# Patient Record
Sex: Female | Born: 1955 | Race: White | Hispanic: No | Marital: Single | Smoking: Former smoker
Health system: Southern US, Community
[De-identification: ages and names within clinical notes are randomized; demographics above are authoritative.]

## PROBLEM LIST (undated history)

## (undated) DIAGNOSIS — N809 Endometriosis, unspecified: Secondary | ICD-10-CM

## (undated) DIAGNOSIS — F32A Depression, unspecified: Secondary | ICD-10-CM

## (undated) DIAGNOSIS — J189 Pneumonia, unspecified organism: Secondary | ICD-10-CM

## (undated) DIAGNOSIS — H269 Unspecified cataract: Secondary | ICD-10-CM

## (undated) DIAGNOSIS — R7303 Prediabetes: Secondary | ICD-10-CM

## (undated) DIAGNOSIS — R0600 Dyspnea, unspecified: Secondary | ICD-10-CM

## (undated) DIAGNOSIS — M1990 Unspecified osteoarthritis, unspecified site: Secondary | ICD-10-CM

## (undated) DIAGNOSIS — D649 Anemia, unspecified: Secondary | ICD-10-CM

## (undated) DIAGNOSIS — U071 COVID-19: Secondary | ICD-10-CM

## (undated) DIAGNOSIS — Z8489 Family history of other specified conditions: Secondary | ICD-10-CM

## (undated) DIAGNOSIS — K219 Gastro-esophageal reflux disease without esophagitis: Secondary | ICD-10-CM

## (undated) DIAGNOSIS — F419 Anxiety disorder, unspecified: Secondary | ICD-10-CM

## (undated) DIAGNOSIS — T7840XA Allergy, unspecified, initial encounter: Secondary | ICD-10-CM

## (undated) DIAGNOSIS — C801 Malignant (primary) neoplasm, unspecified: Secondary | ICD-10-CM

## (undated) DIAGNOSIS — G5603 Carpal tunnel syndrome, bilateral upper limbs: Secondary | ICD-10-CM

## (undated) HISTORY — PX: EYE SURGERY: SHX253

## (undated) HISTORY — DX: Unspecified cataract: H26.9

## (undated) HISTORY — PX: NASAL SEPTUM SURGERY: SHX37

## (undated) HISTORY — DX: Unspecified osteoarthritis, unspecified site: M19.90

## (undated) HISTORY — DX: Anxiety disorder, unspecified: F41.9

## (undated) HISTORY — DX: Malignant (primary) neoplasm, unspecified: C80.1

## (undated) HISTORY — DX: Depression, unspecified: F32.A

## (undated) HISTORY — PX: APPENDECTOMY: SHX54

## (undated) HISTORY — PX: DIAGNOSTIC LAPAROSCOPY: SUR761

## (undated) HISTORY — DX: Allergy, unspecified, initial encounter: T78.40XA

## (undated) HISTORY — DX: Endometriosis, unspecified: N80.9

## (undated) HISTORY — PX: SMALL INTESTINE SURGERY: SHX150

## (undated) HISTORY — DX: Anemia, unspecified: D64.9

---

## 2004-10-20 ENCOUNTER — Ambulatory Visit: Payer: Self-pay | Admitting: Family Medicine

## 2005-07-23 ENCOUNTER — Emergency Department: Payer: Self-pay | Admitting: Emergency Medicine

## 2006-07-12 ENCOUNTER — Encounter: Payer: Self-pay | Admitting: Family Medicine

## 2006-07-12 ENCOUNTER — Ambulatory Visit: Payer: Self-pay | Admitting: Family Medicine

## 2008-08-30 ENCOUNTER — Emergency Department: Payer: Self-pay | Admitting: Emergency Medicine

## 2010-06-09 NOTE — Assessment & Plan Note (Signed)
NAME:  Shari Rivera, ROYSE NO.:  [id number]   MEDICAL RECORD NO.:  [id number]          PATIENT TYPE:  POB   LOCATION:  [facility] at [facility]         FACILITY:  [facility]   PHYSICIAN:  Tinnie Gens, MD        DATE OF BIRTH:  19-Aug-1955   DATE OF SERVICE:  07/12/2006                                  CLINIC NOTE   CHIEF COMPLAINT:  Question menopause.   HISTORY OF PRESENT ILLNESS:  The patient is a 55 year old nulligravida  who previously had normal cycles up until 2 years ago.  Since that time  she had approximately 2 cycles per year.  Her last menstrual period was  September 2007.  For the last 2 years also she has experienced severe  hot flashes, night sweats, mood swings, and some incontinence related to  her night sweats and hot flashes.  The patient reports severe  debilitating hot flashes that come several times daily and several times  nightly.  She is having difficulty keeping her sleep.   PAST MEDICAL HISTORY:  1. 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  2. She had pneumonia in March.  3. She has peptic ulcer disease, which is improved.   PAST SURGICAL HISTORY:  1. Sinus surgery in the early 1980s.  2. Wisdom tooth extraction up in the late 1970s.  3. Appendix removed in 1992.  4. A LEEP procedure in 1994.   Her last Pap was also in 1994.   MEDICATIONS:  She is on Naprosyn as needed, Zantac and Prilosec also as  needed.   ALLERGIES:  None known.   OBSTETRICAL HISTORY:  G0.   GYN HISTORY:  Menarche at age 43.  LMP September 2007.  Cycles  previously regular, every 28 days, lasted 7 days.  She has a history of  endometriosis.  History of abnormal Pap, status post LEEP procedure  without follow-up.   FAMILY HISTORY:  Diabetes, coronary artery disease, hypertension.  Her  mom has a history of ovarian cancer.  Sister with lymphoma.  Grandmother  with leukemia.   SOCIAL HISTORY:  The patient is a previous smoker, 1-1/2 to 2 packs per  day since age of 36.  She  quit with the patch in January of this year.  She drinks approximately 3-5 cups coffee a day as well as a 2 L [facility].  She denies alcohol or other drug use.   A 14-point review of systems is reviewed.  See GYN history in the chart.  The patient has some easy bruising.  She also reports numbness and  weakness in her fingers, left greater than right arm, but both hands  will turn very white.  She denies red or blue turning but that her hands  turn very cold and very pale, consistent with Raynaud's phenomenon.  She  also has night sweats as discussed in the HPI.  Fatigue.  Some weight  gain of about 10 pounds in the last month, although she did quit  smoking.  Some headaches, difficulty with hearing.  Some shortness of  breath probably related to the years of smoking.  Hot flashes, loss of  urine as previously described, some painful intercourse.  PHYSICAL EXAMINATION:  VITAL SIGNS:  Blood pressure is 100/57, weight is  135, pulse is 93.  GENERAL:  She is a well-developed, well-nourished female in no acute  distress.  HEENT:  Normocephalic, atraumatic.  Sclerae anicteric.  NECK:  Supple.  Normal thyroid.  LUNGS:  Clear bilaterally.  CARDIOVASCULAR:  Regular rate and rhythm.  No rubs, gallops or murmurs.  ABDOMEN:  Soft, nontender, nondistended.  EXTREMITIES:  No cyanosis or edema.  She does have some clubbing of her  thumbs, it looks like.  She has 2+ distal pulses.  BREASTS:  Symmetric with everted nipples.  No masses.  LYMPHATIC:  No supraclavicular or axillary adenopathy.  GENITOURINARY:  Very atrophic external genitalia.  BUS is normal.  Vagina is pale with loss of rugation.  Cervix is nulliparous, without  lesion.  Uterus is small and involuted.  No adnexal mass or tenderness.   IMPRESSION:  1. Probable postmenopausal, rule out concomitant thyroid disease.  2. History of abnormal Papanicolaou smear, status post loop      electrosurgical excision procedure.  No  Papanicolaou follow-up      since 1994.  3. Yearly exam.   PLAN:  1. Pap smear today.  2. Scholarship for mammography.  3. TSH and FSH today.  4. Start on Prempro 0.625/2.5 mg one p.o. daily.  5. Will have the patient follow up in approximately 6 weeks to see how      her symptoms are behaving on this dose.           ______________________________  Tinnie Gens, MD     TP/MEDQ  D:  07/12/2006  T:  07/13/2006  Job:  [id number]

## 2013-02-07 ENCOUNTER — Emergency Department: Payer: Self-pay | Admitting: Emergency Medicine

## 2015-05-27 ENCOUNTER — Emergency Department
Admission: EM | Admit: 2015-05-27 | Discharge: 2015-05-28 | Disposition: A | Discharge status: Home / Self Care | Payer: Self-pay | Attending: Emergency Medicine | Admitting: Emergency Medicine

## 2015-05-27 ENCOUNTER — Encounter: Payer: Self-pay | Admitting: Medical Oncology

## 2015-05-27 DIAGNOSIS — F4320 Adjustment disorder, unspecified: Secondary | ICD-10-CM

## 2015-05-27 DIAGNOSIS — R45851 Suicidal ideations: Secondary | ICD-10-CM | POA: Insufficient documentation

## 2015-05-27 DIAGNOSIS — Z87891 Personal history of nicotine dependence: Secondary | ICD-10-CM | POA: Insufficient documentation

## 2015-05-27 LAB — URINE DRUG SCREEN, QUALITATIVE (ARMC ONLY)
Amphetamines, Ur Screen: NOT DETECTED
BARBITURATES, UR SCREEN: NOT DETECTED
Benzodiazepine, Ur Scrn: NOT DETECTED
COCAINE METABOLITE, UR ~~LOC~~: NOT DETECTED
Cannabinoid 50 Ng, Ur ~~LOC~~: POSITIVE — AB
MDMA (Ecstasy)Ur Screen: NOT DETECTED
METHADONE SCREEN, URINE: NOT DETECTED
Opiate, Ur Screen: NOT DETECTED
Phencyclidine (PCP) Ur S: NOT DETECTED
TRICYCLIC, UR SCREEN: NOT DETECTED

## 2015-05-27 LAB — COMPREHENSIVE METABOLIC PANEL
ALT: 40 U/L (ref 14–54)
ANION GAP: 8 (ref 5–15)
AST: 34 U/L (ref 15–41)
Albumin: 4.4 g/dL (ref 3.5–5.0)
Alkaline Phosphatase: 78 U/L (ref 38–126)
BUN: 14 mg/dL (ref 6–20)
CALCIUM: 9.9 mg/dL (ref 8.9–10.3)
CHLORIDE: 104 mmol/L (ref 101–111)
CO2: 26 mmol/L (ref 22–32)
Creatinine, Ser: 0.81 mg/dL (ref 0.44–1.00)
GFR calc non Af Amer: >60 mL/min (ref >60)
Glucose, Bld: 133 mg/dL — ABNORMAL HIGH (ref 65–99)
Potassium: 3.9 mmol/L (ref 3.5–5.1)
SODIUM: 138 mmol/L (ref 135–145)
Total Bilirubin: 0.5 mg/dL (ref 0.3–1.2)
Total Protein: 7.4 g/dL (ref 6.5–8.1)

## 2015-05-27 LAB — ETHANOL

## 2015-05-27 LAB — CBC
HCT: 36.1 % (ref 35.0–47.0)
HEMOGLOBIN: 12.4 g/dL (ref 12.0–16.0)
MCH: 33.6 pg (ref 26.0–34.0)
MCHC: 34.3 g/dL (ref 32.0–36.0)
MCV: 98 fL (ref 80.0–100.0)
PLATELETS: 245 10*3/uL (ref 150–440)
RBC: 3.69 MIL/uL — AB (ref 3.80–5.20)
RDW: 12.7 % (ref 11.5–14.5)
WBC: 9.7 10*3/uL (ref 3.6–11.0)

## 2015-05-27 LAB — ACETAMINOPHEN LEVEL

## 2015-05-27 LAB — SALICYLATE LEVEL

## 2015-05-27 NOTE — ED Notes (Signed)
Pt in under police custody with reports that [employer] was making a call near pts home when they seen here standing in her door holding a gun to herself. [employer] called PD and pt was brought in with hand cuffs. Pt agitated bc she reports that her mom died recently and she has been staying in her house and the bank is foreclosing it Friday leaving the patient homeless. Pt reports she has a lot of stress but denies having gun, denies SI/HI.

## 2015-05-27 NOTE — ED Provider Notes (Signed)
[facility] Emergency Department Provider Note   ____________________________________________  Time seen: Approximately [id number] PM  I have reviewed the triage vital signs and the nursing notes.   HISTORY  Chief Complaint Psychiatric Evaluation   HPI Shari Rivera is a 60 y.o. female without chronic medical problems was presenting to the emergency department today after saying that she was going to kill her self with a gun. Per the initial nursing note the patient was holding a gun to her head. However, the patient denies pulling an actual gun to her head but says that she was pointing to her admission to the motion saying that she was going to kill herself. She says that she is having her house foreclosed on and also the utilities turned off and she has been very frustrated. She says this is why she made the threat. However, she says that she is not suicidal or homicidal. She says that she does not have any psychiatric history or history of suicide attempts.   History reviewed. No pertinent past medical history.  There are no active problems to display for this patient.   History reviewed. No pertinent past surgical history.  Current Outpatient Rx  Name  Route  Sig  Dispense  Refill  . Calcium Carbonate-Vitamin D (CALCIUM 600+D) 600-400 MG-UNIT tablet   Oral   Take 1 tablet by mouth daily.         . Diphenhydramine-APAP, sleep, (EXCEDRIN PM) 38-500 MG TABS   Oral   Take 2 tablets by mouth at bedtime as needed (for sleep).         . Multiple Vitamin (MULTIVITAMIN WITH MINERALS) TABS tablet   Oral   Take 1 tablet by mouth daily.           Allergies Codeine  No family history on file.  Social History Social History  Substance Use Topics  . Smoking status: Former Research scientist (life sciences)  . Smokeless tobacco: None  . Alcohol Use: No    Review of Systems Constitutional: No fever/chills Eyes: No visual changes. ENT: No sore throat. Cardiovascular:  Denies chest pain. Respiratory: Denies shortness of breath. Gastrointestinal: No abdominal pain.  No nausea, no vomiting.  No diarrhea.  No constipation. Genitourinary: Negative for dysuria. Musculoskeletal: Negative for back pain. Skin: Negative for rash. Neurological: Negative for headaches, focal weakness or numbness.  10-point ROS otherwise negative.  ____________________________________________   PHYSICAL EXAM:  VITAL SIGNS: ED Triage Vitals  Enc Vitals Group     BP 05/27/15 1858 117/65 mmHg     Pulse Rate 05/27/15 1858 100     Resp 05/27/15 1858 18     Temp 05/27/15 1858 97.7 F (36.5 C)     Temp Source 05/27/15 1858 Oral     SpO2 05/27/15 1858 100 %     Weight 05/27/15 1858 127 lb (57.607 kg)     Height 05/27/15 1858 5\' 3"  (1.6 m)     Head Cir --      Peak Flow --      Pain Score --      Pain Loc --      Pain Edu? --      Excl. in [place]? --     Constitutional: Alert and oriented. Well appearing and in no acute distress. Eyes: Conjunctivae are normal. PERRL. EOMI. Head: Atraumatic. Nose: No congestion/rhinnorhea. Mouth/Throat: Mucous membranes are moist.   Neck: No stridor.   Cardiovascular: Normal rate, regular rhythm. Grossly normal heart sounds.   Respiratory: Normal respiratory  effort.  No retractions. Lungs CTAB. Gastrointestinal: Soft and nontender. No distention.  Musculoskeletal: No lower extremity tenderness nor edema.  No joint effusions. Neurologic:  Normal speech and language. No gross focal neurologic deficits are appreciated.  Skin:  Skin is warm, dry and intact. No rash noted. Psychiatric: Mood and affect are normal. Speech and behavior are normal.  ____________________________________________   LABS (all labs ordered are listed, but only abnormal results are displayed)  Labs Reviewed  COMPREHENSIVE METABOLIC PANEL - Abnormal; Notable for the following:    Glucose, Bld 133 (*)    All other components within normal limits  ACETAMINOPHEN LEVEL  - Abnormal; Notable for the following:    Acetaminophen (Tylenol), Serum <10 (*)    All other components within normal limits  CBC - Abnormal; Notable for the following:    RBC 3.69 (*)    All other components within normal limits  URINE DRUG SCREEN, QUALITATIVE ([facility] ONLY) - Abnormal; Notable for the following:    Cannabinoid 50 Ng, Ur  POSITIVE (*)    All other components within normal limits  ETHANOL  SALICYLATE LEVEL   ____________________________________________  EKG   ____________________________________________  RADIOLOGY   ____________________________________________   PROCEDURES    ____________________________________________   INITIAL IMPRESSION / ASSESSMENT AND PLAN / ED COURSE  Pertinent labs & imaging results that were available during my care of the patient were reviewed by me and considered in my medical decision making (see chart for details).  Patient, and with normal affect and says that she was just frustrated. She denies having a gun. She was involuntarily committed prior to arrival. She will be seen by psychiatry. Says that she has friends to stay with once she loses the ability to stay in a house this Friday. ____________________________________________   FINAL CLINICAL IMPRESSION(S) / ED DIAGNOSES  Suicidal ideation.    NEW MEDICATIONS STARTED DURING THIS VISIT:  New Prescriptions   No medications on file     Note:  This document was prepared using Dragon voice recognition software and may include unintentional dictation errors.    Orbie Pyo, MD 05/27/15 [phone]

## 2015-05-27 NOTE — ED Notes (Signed)
Pt in room watching tv and calm

## 2015-05-27 NOTE — BH Assessment (Addendum)
Tele Assessment Note   Shari Rivera is an 60 y.o. female presenting to [facility] after being petitioned for involuntary commitment. Pt stated "I am here for something stupid". "It was blown out of portion". Pt reported that she was feeling frustrated with the events of today. Pt reported that her mother passed away and she is currently staying in her mother'Rivera home. She reported that prior to her mother'Rivera death, her mother took out a reverse mortgage and now the bank is going to foreclose on the home. She reported that on Friday she will have to move out and shared that she spoke with various utility companies to keep her utilities on until Friday. She shared that today her water was off and someone from [place] was there to disconnect the power. She stated "I was so frustrated with everything going on I said that I wish this was over". "If I had a gun I would shoot myself". Pt denies SI, HI and AVH at this time. Pt did not report any previous suicide attempts or self-injurious behaviors. Pt denied having a psychiatric history and did not report any acute hospitalizations. Pt reported that having to move out on Friday is a stressor and shared that she has always had trouble sleeping. Pt shared that she feels fatigues and has insomnia.  Pt did not report any alcohol or illicit substance abuse at this time. Pt denied having access to weapons; however documentation from medical staff reported that pt was standing in her door holding a gun to herself. Pt did not report any pending criminal charges or upcoming court dates. Pt denies physical, sexual and emotional abuse at this time. PT reported that she is employed and has a good support system which includes her siblings. Pt is alert and oriented x3. Pt is dressed appropriately in scrubs and maintains good eye contact during this assessment. Pt speech is normal and her mood and affect is appropriate. PT thought process is coherent and relevant. PT'Rivera judgement is fair.    Diagnosis: F99 Unspecified mental disorder    Past Medical History: History reviewed. No pertinent past medical history.  History reviewed. No pertinent past surgical history.  Family History: No family history on file.  Social History:  reports that she has quit smoking. She does not have any smokeless tobacco history on file. She reports that she does not drink alcohol or use illicit drugs.  Additional Social History:  Alcohol / Drug Use History of alcohol / drug use?: No history of alcohol / drug abuse  CIWA: CIWA-Ar BP: 135/72 mmHg Pulse Rate: 94 COWS:    PATIENT STRENGTHS: (choose at least two) Average or above average intelligence Supportive family/friends  Allergies:  Allergies  Allergen Reactions  . Codeine Diarrhea and Nausea And Vomiting    Home Medications:  (Not in a hospital admission)  OB/GYN Status:  No LMP recorded. Patient is postmenopausal.  General Assessment Data Location of Assessment: [facility] ED TTS Assessment: In system Is this a Tele or Face-to-Face Assessment?: Tele Assessment Is this an Initial Assessment or a Re-assessment for this encounter?: Initial Assessment Is patient pregnant?: No Pregnancy Status: No Living Arrangements: Alone Can pt return to current living arrangement?: Yes Admission Status: Involuntary Is patient capable of signing voluntary admission?: Yes Referral Source: Self/Family/Friend Insurance type: None      Crisis Care Plan Living Arrangements: Alone Name of Psychiatrist: No provider reported.  Name of Therapist: No provider reported.   Education Status Is patient currently in school?: No  Risk to self with the past 6 months Suicidal Ideation: No Has patient been a risk to self within the past 6 months prior to admission? : No Suicidal Intent: No Has patient had any suicidal intent within the past 6 months prior to admission? : No Is patient at risk for suicide?: No Suicidal Plan?: No Has patient had any  suicidal plan within the past 6 months prior to admission? : No Access to Means: No What has been your use of drugs/alcohol within the last 12 months?: Pt denies alcohol and drug use.  Previous Attempts/Gestures: No How many times?: 0 Other Self Harm Risks: Pt denies.  Triggers for Past Attempts: None known (No previous attempts reported. ) Intentional Self Injurious Behavior: None Family Suicide History: No Recent stressful life event(Rivera): Other (Comment) (Pt reported that her mother'Rivera home is going into foreclosure) Persecutory voices/beliefs?: Yes Depression: No Depression Symptoms: Insomnia, Fatigue Substance abuse history and/or treatment for substance abuse?: No  Risk to Others within the past 6 months Homicidal Ideation: No Does patient have any lifetime risk of violence toward others beyond the six months prior to admission? : No Thoughts of Harm to Others: No Current Homicidal Intent: No Current Homicidal Plan: No Access to Homicidal Means: No Identified Victim: N/A History of harm to others?: No Assessment of Violence: None Noted Violent Behavior Description: No violent behaviors observed. Pt is calm and cooperative at this time.  Does patient have access to weapons?: No Criminal Charges Pending?: No Does patient have a court date: No Is patient on probation?: No  Psychosis Hallucinations: None noted Delusions: None noted  Mental Status Report Appearance/Hygiene: In scrubs Eye Contact: Good Motor Activity: Freedom of movement Speech: Logical/coherent Level of Consciousness: Quiet/awake Mood: Pleasant Affect: Appropriate to circumstance Anxiety Level: Moderate Thought Processes: Coherent, Relevant Judgement: Unimpaired Orientation: Person, Situation, Time, Place, Appropriate for developmental age Obsessive Compulsive Thoughts/Behaviors: None  Cognitive Functioning Concentration: Normal Memory: Recent Intact, Remote Intact IQ: Average Insight: Fair Impulse  Control: Good Appetite: Good Weight Loss: 0 Weight Gain: 0 Sleep: No Change Total Hours of Sleep: 5 (wakes frequently ) Vegetative Symptoms: None  ADLScreening [facility] Assessment Services) Patient'Rivera cognitive ability adequate to safely complete daily activities?: Yes Patient able to express need for assistance with ADLs?: Yes Independently performs ADLs?: Yes (appropriate for developmental age)  Prior Inpatient Therapy Prior Inpatient Therapy: No  Prior Outpatient Therapy Prior Outpatient Therapy: No Does patient have an ACCT team?: No Does patient have Intensive In-House Services?  : No Does patient have [facility] services? : No Does patient have P4CC services?: No  ADL Screening (condition at time of admission) Patient'Rivera cognitive ability adequate to safely complete daily activities?: Yes Is the patient deaf or have difficulty hearing?: No Does the patient have difficulty seeing, even when wearing glasses/contacts?: No Does the patient have difficulty concentrating, remembering, or making decisions?: No Patient able to express need for assistance with ADLs?: Yes Does the patient have difficulty dressing or bathing?: No Independently performs ADLs?: Yes (appropriate for developmental age)       Abuse/Neglect Assessment (Assessment to be complete while patient is alone) Physical Abuse: Denies Verbal Abuse: Denies Sexual Abuse: Denies Exploitation of patient/patient'Rivera resources: Denies Self-Neglect: Denies     Regulatory affairs officer (For Healthcare) Does patient have an advance directive?: No Would patient like information on creating an advanced directive?: No - patient declined information    Additional Information 1:1 In Past 12 Months?: Yes CIRT Risk: No Elopement Risk: No Does patient have  medical clearance?: Yes     Disposition:  Disposition Initial Assessment Completed for this Encounter: Yes  Shari Rivera 05/27/2015 11:15 PM

## 2015-05-28 NOTE — ED Provider Notes (Signed)
-----------------------------------------   1:22 AM on 05/28/2015 -----------------------------------------   Blood pressure 135/72, pulse 94, temperature 97.7 F (36.5 C), temperature source Oral, resp. rate 20, height 5\' 3"  (1.6 m), weight 127 lb (57.607 kg), SpO2 98 %.  Assuming care from Dr. Clearnce Hasten.  In short, Shari Rivera is a 60 y.o. female with a chief complaint of Psychiatric Evaluation .  Refer to the original H&P for additional details.  The current plan of care is to follow up the recommendations of the specialist on call.   Patient was seen by the specialist on-call and they recommend discontinuing the IVC. They report that the patient does not meet criteria for involuntary psychiatric hospitalization. The patient is not an acute suicidal risk at this time according to the specialist on-call. According to the associated the patient's neighbor feels it is safe for the patient to go home and she will be discharged with her friend. They recommend melatonin for her insomnia and recommend outpatient psychiatric follow-up within 4 weeks.  She will be discharged home.  Loney Hering, MD 05/28/15 [phone]

## 2015-05-28 NOTE — Discharge Instructions (Signed)
No-harm Safety Contract A no-harm safety contract is a written or verbal agreement between you and a mental health professional to promote safety. It contains specific actions and promises you agree to. The agreement also includes instructions from the therapist or doctor. The instructions will help prevent you from harming yourself or harming others. Harm can be as mild as pinching yourself, but can increase in intensity to actions like burning or cutting yourself. The extreme level of self-harm would be committing suicide. No-harm safety contracts are also sometimes referred to as a Charity fundraiser, suicide Financial controller, no-harm agreements or decisions, or a Engineer, manufacturing systems.  REASONS FOR NO-HARM SAFETY CONTRACTS Safety contracts are just one part of an overall treatment plan to help keep you safe and free of harm. A safety contract may help to relieve anxiety, restore a sense of control, state clearly the alternatives to harm or suicide, and give you and your therapist or doctor a gauge for how you are doing in between visits. Many factors impact the decision to use a no-harm safety contract and its effectiveness. A proper overall treatment plan and evaluation and good patient understanding are the keys to good outcomes. CONTRACT ELEMENTS  A contract can range from simple to complex. They include all or some of the following:  Action statements. These are statements you agree to do or not do. Example: If I feel my life is becoming too difficult, I agree to do the following so there is no harm to myself or others:  Talk with family or friends.  Rid myself of all things that I could use to harm myself.  Do an activity I enjoy or have enjoyed in the recent past. Coping strategies. These are ways to think and feel that decrease stress, such as:  Use of affirmations or positive statements about self.  Good self-care, including improved grooming, and healthy eating, and healthy sleeping  patterns.  Increase physical exercise.  Increase social involvement.  Focus on positive aspects of life. Crisis management. This would include what to do if there was trouble following the contract or an urge to harm. This might include notifying family or your therapist of suicidal thoughts. Be open and honest about suicidal urges. To prevent a crisis, do the following:  List reasons to reach out for support.  Keep contact numbers and available hours handy. Treatment goals. These are goals would include no suicidal thoughts, improved mood, and feelings of hopefulness. Listed responsibilities of different people involved in care. This could include family members. A family member may agree to remove firearms or other lethal weapons/substances from your ease of access. A timeline. A timeline can be in place from one therapy session to the next session. HOME CARE INSTRUCTIONS   Follow your no-harm safety contract.  Contact your therapist and/or doctor if you have any questions or concerns. MAKE SURE YOU:   Understand these instructions.  Will watch your condition. Noticing any mood changes or suicidal urges.  Will get help right away if you are not doing well or get worse.   This information is not intended to replace advice given to you by your health care provider. Make sure you discuss any questions you have with your health care provider.   Document Released: 07/01/2009 Document Revised: 02/01/2014 Document Reviewed: 07/01/2009 Elsevier Interactive Patient Education 2016 [employer].  Adjustment Disorder Adjustment disorder is an unusually severe reaction to a stressful life event, such as the loss of a job or physical illness. The event  may be any stressful event other than the loss of a loved one. Adjustment disorder may affect your feelings, your thinking, how you act, or a combination of these. It may interfere with personal relationships or with the way you are at work,  school, or home. People with this disorder are at risk for suicide and substance abuse. They may develop a more serious mental disorder, such as major depressive disorder or post-traumatic stress disorder. SIGNS AND SYMPTOMS  Symptoms may include:  Sadness, depressed mood, or crying spells.  Loss of enjoyment.  Change in appetite or weight.  Sense of loss or hopelessness.  Thoughts of suicide.  Anxiety, worry, or nervousness.  Trouble sleeping.  Avoiding family and friends.  Poor school performance.  Fighting or vandalism.  Reckless driving.  Skipping school.  Poor work Systems analyst.  Ignoring bills. Symptoms of adjustment disorder start within 3 months of the stressful life event. They do not last more than 6 months after the event has ended. DIAGNOSIS  To make a diagnosis, your health care provider will ask about what has happened in your life and how it has affected you. He or she may also ask about your medical history and use of medicines, alcohol, and other substances. Your health care provider may do a physical exam and order lab tests or other studies. You may be referred to a mental health specialist for evaluation. TREATMENT  Treatment options include:  Counseling or talk therapy. Talk therapy is usually provided by mental health specialists.  Medicine. Certain medicines may help with depression, anxiety, and sleep.  Support groups. Support groups offer emotional support, advice, and guidance. They are made up of people who have had similar experiences. HOME CARE INSTRUCTIONS  Keep all follow-up visits as directed by your health care provider. This is important.  Take medicines only as directed by your health care provider. SEEK MEDICAL CARE IF:  Your symptoms get worse.  SEEK IMMEDIATE MEDICAL CARE IF: You have serious thoughts about hurting yourself or someone else. MAKE SURE YOU:  Understand these instructions.  Will watch your condition.  Will get  help right away if you are not doing well or get worse.   This information is not intended to replace advice given to you by your health care provider. Make sure you discuss any questions you have with your health care provider.   Document Released: 09/15/2005 Document Revised: 02/01/2014 Document Reviewed: 06/05/2013 Elsevier Interactive Patient Education 2016 [place] and Stress Management Stress is a normal reaction to life events. It is what you feel when life demands more than you are used to or more than you can handle. Some stress can be useful. For example, the stress reaction can help you catch the last bus of the day, study for a test, or meet a deadline at work. But stress that occurs too often or for too long can cause problems. It can affect your emotional health and interfere with relationships and normal daily activities. Too much stress can weaken your immune system and increase your risk for physical illness. If you already have a medical problem, stress can make it worse. CAUSES  All sorts of life events may cause stress. An event that causes stress for one person may not be stressful for another person. Major life events commonly cause stress. These may be positive or negative. Examples include losing your job, moving into a new home, getting married, having a baby, or losing a loved one. Less obvious life  events may also cause stress, especially if they occur day after day or in combination. Examples include working long hours, driving in traffic, caring for children, being in debt, or being in a difficult relationship. SIGNS AND SYMPTOMS Stress may cause emotional symptoms including, the following:  Anxiety. This is feeling worried, afraid, on edge, overwhelmed, or out of control.  Anger. This is feeling irritated or impatient.  Depression. This is feeling sad, down, helpless, or guilty.  Difficulty focusing, remembering, or making decisions. Stress may cause  physical symptoms, including the following:   Aches and pains. These may affect your head, neck, back, stomach, or other areas of your body.  Tight muscles or clenched jaw.  Low energy or trouble sleeping. Stress may cause unhealthy behaviors, including the following:   Eating to feel better (overeating) or skipping meals.  Sleeping too little, too much, or both.  Working too much or putting off tasks (procrastination).  Smoking, drinking alcohol, or using drugs to feel better. DIAGNOSIS  Stress is diagnosed through an assessment by your health care provider. Your health care provider will ask questions about your symptoms and any stressful life events.Your health care provider will also ask about your medical history and may order blood tests or other tests. Certain medical conditions and medicine can cause physical symptoms similar to stress. Mental illness can cause emotional symptoms and unhealthy behaviors similar to stress. Your health care provider may refer you to a mental health professional for further evaluation.  TREATMENT  Stress management is the recommended treatment for stress.The goals of stress management are reducing stressful life events and coping with stress in healthy ways.  Techniques for reducing stressful life events include the following:  Stress identification. Self-monitor for stress and identify what causes stress for you. These skills may help you to avoid some stressful events.  Time management. Set your priorities, keep a calendar of events, and learn to say "no." These tools can help you avoid making too many commitments. Techniques for coping with stress include the following:  Rethinking the problem. Try to think realistically about stressful events rather than ignoring them or overreacting. Try to find the positives in a stressful situation rather than focusing on the negatives.  Exercise. Physical exercise can release both physical and emotional  tension. The key is to find a form of exercise you enjoy and do it regularly.  Relaxation techniques. These relax the body and mind. Examples include yoga, meditation, tai chi, biofeedback, deep breathing, progressive muscle relaxation, listening to music, being out in nature, journaling, and other hobbies. Again, the key is to find one or more that you enjoy and can do regularly.  Healthy lifestyle. Eat a balanced diet, get plenty of sleep, and do not smoke. Avoid using alcohol or drugs to relax.  Strong support network. Spend time with family, friends, or other people you enjoy being around.Express your feelings and talk things over with someone you trust. Counseling or talktherapy with a mental health professional may be helpful if you are having difficulty managing stress on your own. Medicine is typically not recommended for the treatment of stress.Talk to your health care provider if you think you need medicine for symptoms of stress. HOME CARE INSTRUCTIONS  Keep all follow-up visits as directed by your health care provider.  Take all medicines as directed by your health care provider. SEEK MEDICAL CARE IF:  Your symptoms get worse or you start having new symptoms.  You feel overwhelmed by your problems and can  no longer manage them on your own. SEEK IMMEDIATE MEDICAL CARE IF:  You feel like hurting yourself or someone else.   This information is not intended to replace advice given to you by your health care provider. Make sure you discuss any questions you have with your health care provider.   Document Released: 07/07/2000 Document Revised: 02/01/2014 Document Reviewed: 09/05/2012 Elsevier Interactive Patient Education [employer].

## 2017-10-05 ENCOUNTER — Emergency Department
Admission: EM | Admit: 2017-10-05 | Discharge: 2017-10-05 | Disposition: A | Discharge status: Home / Self Care | Payer: Self-pay | Attending: Emergency Medicine | Admitting: Emergency Medicine

## 2017-10-05 ENCOUNTER — Other Ambulatory Visit: Payer: Self-pay

## 2017-10-05 DIAGNOSIS — K529 Noninfective gastroenteritis and colitis, unspecified: Secondary | ICD-10-CM | POA: Insufficient documentation

## 2017-10-05 DIAGNOSIS — Z87891 Personal history of nicotine dependence: Secondary | ICD-10-CM | POA: Insufficient documentation

## 2017-10-05 DIAGNOSIS — N3000 Acute cystitis without hematuria: Secondary | ICD-10-CM | POA: Insufficient documentation

## 2017-10-05 LAB — COMPREHENSIVE METABOLIC PANEL
ALT: 37 U/L (ref 0–44)
ANION GAP: 8 (ref 5–15)
AST: 27 U/L (ref 15–41)
Albumin: 4.6 g/dL (ref 3.5–5.0)
Alkaline Phosphatase: 113 U/L (ref 38–126)
BUN: 9 mg/dL (ref 8–23)
CHLORIDE: 106 mmol/L (ref 98–111)
CO2: 25 mmol/L (ref 22–32)
Calcium: 10.3 mg/dL (ref 8.9–10.3)
Creatinine, Ser: 0.87 mg/dL (ref 0.44–1.00)
Glucose, Bld: 119 mg/dL — ABNORMAL HIGH (ref 70–99)
POTASSIUM: 4.1 mmol/L (ref 3.5–5.1)
Sodium: 139 mmol/L (ref 135–145)
Total Bilirubin: 0.5 mg/dL (ref 0.3–1.2)
Total Protein: 8.3 g/dL — ABNORMAL HIGH (ref 6.5–8.1)

## 2017-10-05 LAB — URINALYSIS, COMPLETE (UACMP) WITH MICROSCOPIC
BILIRUBIN URINE: NEGATIVE
Glucose, UA: NEGATIVE mg/dL
Hgb urine dipstick: NEGATIVE
KETONES UR: NEGATIVE mg/dL
Nitrite: NEGATIVE
PH: 5 (ref 5.0–8.0)
Protein, ur: NEGATIVE mg/dL
SPECIFIC GRAVITY, URINE: 1.019 (ref 1.005–1.030)

## 2017-10-05 LAB — LIPASE, BLOOD: LIPASE: 24 U/L (ref 11–51)

## 2017-10-05 LAB — CBC
HEMATOCRIT: 44.9 % (ref 35.0–47.0)
Hemoglobin: 15.1 g/dL (ref 12.0–16.0)
MCH: 34 pg (ref 26.0–34.0)
MCHC: 33.7 g/dL (ref 32.0–36.0)
MCV: 101 fL — AB (ref 80.0–100.0)
Platelets: 259 10*3/uL (ref 150–440)
RBC: 4.45 MIL/uL (ref 3.80–5.20)
RDW: 14 % (ref 11.5–14.5)
WBC: 6.7 10*3/uL (ref 3.6–11.0)

## 2017-10-05 MED ORDER — NITROFURANTOIN MONOHYD MACRO 100 MG PO CAPS: 100.0000 mg | ORAL_CAPSULE | Freq: Two times a day (BID) | ORAL | 0 refills | Status: AC

## 2017-10-05 MED ORDER — ONDANSETRON HCL 4 MG/2ML IJ SOLN
4.0000 mg | Freq: Once | INTRAMUSCULAR | Status: AC
  Administered 2017-10-05: 4 mg via INTRAVENOUS
  Filled 2017-10-05: qty 2

## 2017-10-05 MED ORDER — SODIUM CHLORIDE 0.9 % IV SOLN
Freq: Once | INTRAVENOUS | Status: AC
  Administered 2017-10-05: 10:00:00 via INTRAVENOUS

## 2017-10-05 MED ORDER — ONDANSETRON 4 MG PO TBDP: 4.0000 mg | ORAL_TABLET | Freq: Three times a day (TID) | ORAL | 0 refills | Status: DC | PRN

## 2017-10-05 NOTE — ED Provider Notes (Signed)
[facility] Emergency Department Provider Note       Time seen: ----------------------------------------- 9:29 AM on 10/05/2017 -----------------------------------------   I have reviewed the triage vital signs and the nursing notes.  HISTORY   Chief Complaint Diarrhea and Emesis    HPI Shari Rivera is a 62 y.o. female with no significant past medical history who presents to the ED for diarrhea and vomiting ever since Sunday.  Patient states she has had at least 6 in the last 24 hours of each.  She has had some blurry vision and is not sure if that is related.  She has a headache and has had difficulty keeping anything down.  She denies any specific abdominal pain.  History reviewed. No pertinent past medical history.  There are no active problems to display for this patient.   History reviewed. No pertinent surgical history.  Allergies Codeine  Social History Social History   Tobacco Use  . Smoking status: Former Research scientist (life sciences)  . Smokeless tobacco: Never Used  Substance Use Topics  . Alcohol use: No  . Drug use: No   Review of Systems Constitutional: Negative for fever. Eyes: Positive for vision changes Cardiovascular: Negative for chest pain. Respiratory: Negative for shortness of breath. Gastrointestinal: Positive for vomiting and diarrhea Musculoskeletal: Negative for back pain. Skin: Negative for rash. Neurological: Negative for headaches, focal weakness or numbness.  All systems negative/normal/unremarkable except as stated in the HPI  ____________________________________________   PHYSICAL EXAM:  VITAL SIGNS: ED Triage Vitals  Enc Vitals Group     BP 10/05/17 0850 127/86     Pulse Rate 10/05/17 0850 (!) 108     Resp --      Temp 10/05/17 0850 97.6 F (36.4 C)     Temp Source 10/05/17 0850 Oral     SpO2 10/05/17 0850 100 %     Weight 10/05/17 0850 116 lb (52.6 kg)     Height 10/05/17 0850 5\' 3"  (1.6 m)     Head  Circumference --      Peak Flow --      Pain Score 10/05/17 0853 7     Pain Loc --      Pain Edu? --      Excl. in [place]? --    Constitutional: Alert and oriented. Well appearing and in no distress. Eyes: Conjunctivae are normal. Normal extraocular movements. ENT   Head: Normocephalic and atraumatic.   Nose: No congestion/rhinnorhea.   Mouth/Throat: Mucous membranes are moist.   Neck: No stridor. Cardiovascular: Normal rate, regular rhythm. No murmurs, rubs, or gallops. Respiratory: Normal respiratory effort without tachypnea nor retractions. Breath sounds are clear and equal bilaterally. No wheezes/rales/rhonchi. Gastrointestinal: Soft and nontender. Normal bowel sounds Musculoskeletal: Nontender with normal range of motion in extremities. No lower extremity tenderness nor edema. Neurologic:  Normal speech and language. No gross focal neurologic deficits are appreciated.  Skin:  Skin is warm, dry and intact. No rash noted. Psychiatric: Mood and affect are normal. Speech and behavior are normal.  ____________________________________________  ED COURSE:  As part of my medical decision making, I reviewed the following data within the [place] History obtained from family if available, nursing notes, old chart and ekg, as well as notes from prior ED visits. Patient presented for vomiting and diarrhea, we will assess with labs and give IV fluids and antiemetics   Procedures ____________________________________________   LABS (pertinent positives/negatives)  Labs Reviewed  COMPREHENSIVE METABOLIC PANEL - Abnormal; Notable for the following  components:      Result Value   Glucose, Bld 119 (*)    Total Protein 8.3 (*)    All other components within normal limits  CBC - Abnormal; Notable for the following components:   MCV 101.0 (*)    All other components within normal limits  URINALYSIS, COMPLETE (UACMP) WITH MICROSCOPIC - Abnormal; Notable for the  following components:   Color, Urine YELLOW (*)    APPearance HAZY (*)    Leukocytes, UA LARGE (*)    Bacteria, UA RARE (*)    All other components within normal limits  LIPASE, BLOOD   ___________________________________________  DIFFERENTIAL DIAGNOSIS   Gastroenteritis, dehydration, electrolyte abnormality, hyperglycemia  FINAL ASSESSMENT AND PLAN  Gastroenteritis   Plan: The patient had presented for vomiting and diarrhea. Patient's labs revealed perhaps mild UTI although no other acute process.  He was feeling much better after fluids and antiemetics.  She will be discharged home with antiemetics and encouraged to take Imodium as needed.   Laurence Aly, MD   Note: This note was generated in part or whole with voice recognition software. Voice recognition is usually quite accurate but there are transcription errors that can and very often do occur. I apologize for any typographical errors that were not detected and corrected.     Earleen Newport, MD 10/05/17 1121

## 2017-10-05 NOTE — ED Triage Notes (Signed)
Pt states that she has been sick since Sunday with diarrhea (x6 in 24 hours), vomiting (x6 in 24 hours), and blurred vision (pt has cataracts and has had blurred vision for quite some time worse at night)

## 2020-04-07 ENCOUNTER — Telehealth: Payer: Self-pay | Admitting: General Practice

## 2020-04-07 NOTE — Telephone Encounter (Signed)
Pt is calling to see if Dr. Rosanna Randy would consider taking her on as a new patient. Pts Sister Thamas Jaegers is a patient of Dr. Rosanna Randy. Please advise [phone]- [phone]

## 2020-04-08 NOTE — Telephone Encounter (Signed)
Ok to make appt for this new pt this spring or summer.

## 2020-04-10 NOTE — Telephone Encounter (Signed)
Please schedule new pt appt for patient. Thanks!

## 2020-05-26 ENCOUNTER — Ambulatory Visit: Payer: Self-pay | Admitting: Family Medicine

## 2020-06-25 DIAGNOSIS — Z20822 Contact with and (suspected) exposure to covid-19: Secondary | ICD-10-CM | POA: Diagnosis not present

## 2020-06-25 DIAGNOSIS — Z03818 Encounter for observation for suspected exposure to other biological agents ruled out: Secondary | ICD-10-CM | POA: Diagnosis not present

## 2020-08-07 ENCOUNTER — Ambulatory Visit: Payer: Medicare Other | Admitting: Family Medicine

## 2020-10-06 ENCOUNTER — Telehealth: Payer: Self-pay

## 2020-10-06 NOTE — Telephone Encounter (Signed)
Copied from [place] [phone]. Topic: Appointment Scheduling - Prior Auth Required for Appointment >> Oct 06, 2020 10:36 AM Robina Ade, Helene Kelp D wrote: No appointment has been scheduled. Patient is requesting New patient appointment. Patient had appointment with Dr. Rosanna Randy had to cancelled due to having covid and would like to reschedule appointment with him soon. Per scheduling protocol, this appointment requires a prior authorization prior to scheduling.  Route to department's PEC pool.

## 2020-10-06 NOTE — Telephone Encounter (Signed)
Please advise if you approved this patient to establish care as your new patient.

## 2020-10-06 NOTE — Telephone Encounter (Signed)
Left patient a voicemail that Dr. Rosanna Randy is not taking new patients.

## 2020-10-06 NOTE — Telephone Encounter (Signed)
Copied from [place] [phone]. Topic: Appointment Scheduling - Prior Auth Required for Appointment >> Oct 06, 2020 10:36 AM Robina Ade, Helene Kelp D wrote: No appointment has been scheduled. Patient is requesting New patient appointment. Patient had appointment with Dr. Rosanna Randy had to cancelled due to having covid and would like to reschedule appointment with him soon. Per scheduling protocol, this appointment requires a prior authorization prior to scheduling.  Route to department's PEC pool.

## 2020-10-08 NOTE — Telephone Encounter (Signed)
L/M to schedule new pt appt. Ok to schedule if patient calls back.

## 2020-10-08 NOTE — Telephone Encounter (Signed)
Dr. Rosanna Randy, did you want to take her as a new patient?

## 2020-10-09 NOTE — Telephone Encounter (Signed)
New pt appt scheduled for 10/28/2020. TNP

## 2020-10-16 DIAGNOSIS — H25811 Combined forms of age-related cataract, right eye: Secondary | ICD-10-CM | POA: Diagnosis not present

## 2020-10-28 ENCOUNTER — Ambulatory Visit: Payer: Medicaid Other | Admitting: Family Medicine

## 2020-10-30 DIAGNOSIS — H25811 Combined forms of age-related cataract, right eye: Secondary | ICD-10-CM | POA: Diagnosis not present

## 2020-11-11 ENCOUNTER — Other Ambulatory Visit: Payer: Self-pay

## 2020-11-11 ENCOUNTER — Other Ambulatory Visit (HOSPITAL_COMMUNITY)
Admission: RE | Admit: 2020-11-11 | Discharge: 2020-11-11 | Disposition: A | Discharge status: Home / Self Care | Payer: Medicare Other | Source: Ambulatory Visit | Attending: Family Medicine | Admitting: Family Medicine

## 2020-11-11 ENCOUNTER — Ambulatory Visit (INDEPENDENT_AMBULATORY_CARE_PROVIDER_SITE_OTHER): Payer: Medicare Other | Admitting: Family Medicine

## 2020-11-11 ENCOUNTER — Encounter: Payer: Self-pay | Admitting: Family Medicine

## 2020-11-11 VITALS — BP 135/68 | HR 85 | Temp 98.3°F | Wt 125.0 lb

## 2020-11-11 DIAGNOSIS — Z1151 Encounter for screening for human papillomavirus (HPV): Secondary | ICD-10-CM | POA: Diagnosis not present

## 2020-11-11 DIAGNOSIS — Z1231 Encounter for screening mammogram for malignant neoplasm of breast: Secondary | ICD-10-CM

## 2020-11-11 DIAGNOSIS — F419 Anxiety disorder, unspecified: Secondary | ICD-10-CM | POA: Diagnosis not present

## 2020-11-11 DIAGNOSIS — Z113 Encounter for screening for infections with a predominantly sexual mode of transmission: Secondary | ICD-10-CM | POA: Diagnosis not present

## 2020-11-11 DIAGNOSIS — Z8249 Family history of ischemic heart disease and other diseases of the circulatory system: Secondary | ICD-10-CM

## 2020-11-11 DIAGNOSIS — Z01419 Encounter for gynecological examination (general) (routine) without abnormal findings: Secondary | ICD-10-CM | POA: Diagnosis not present

## 2020-11-11 DIAGNOSIS — Z1159 Encounter for screening for other viral diseases: Secondary | ICD-10-CM | POA: Diagnosis not present

## 2020-11-11 DIAGNOSIS — R102 Pelvic and perineal pain: Secondary | ICD-10-CM | POA: Diagnosis not present

## 2020-11-11 DIAGNOSIS — Z7689 Persons encountering health services in other specified circumstances: Secondary | ICD-10-CM | POA: Diagnosis not present

## 2020-11-11 DIAGNOSIS — Z841 Family history of disorders of kidney and ureter: Secondary | ICD-10-CM | POA: Diagnosis not present

## 2020-11-11 DIAGNOSIS — I7300 Raynaud's syndrome without gangrene: Secondary | ICD-10-CM

## 2020-11-11 DIAGNOSIS — F3289 Other specified depressive episodes: Secondary | ICD-10-CM

## 2020-11-11 DIAGNOSIS — Z1211 Encounter for screening for malignant neoplasm of colon: Secondary | ICD-10-CM

## 2020-11-11 MED ORDER — SERTRALINE HCL 50 MG PO TABS: 50.0000 mg | ORAL_TABLET | Freq: Every day | ORAL | 3 refills | Status: DC

## 2020-11-11 MED ORDER — AMLODIPINE BESYLATE 2.5 MG PO TABS: 2.5000 mg | ORAL_TABLET | Freq: Every day | ORAL | 3 refills | Status: DC

## 2020-11-11 NOTE — Progress Notes (Signed)
New patient visit   Patient: Shari Rivera   DOB: 29-Dec-1955   65 y.o. Female  MRN: [id number] Visit Date: 11/11/2020  Today's healthcare provider: Wilhemena Durie, MD   No chief complaint on file.  Subjective    Shari Rivera is a 65 y.o. female who presents today as a new patient to establish care.  HPI  She has never been seen by primary care on her life.  She is single and cleans houses for living.  She quit smoking in 2007.  She is a nondrinker and denies any drug use.   She states that she had 1 of 11 children. Past medical history Bilateral cataract removal Appendectomy Deviated septum repair Uroscopy for endometriosis. 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     No past medical history on file. No past surgical history on file. No family status information on file.  Patient is No family history on file. Social History   Socioeconomic History   Marital status: Single    Spouse name: Not on file   Number of children: Not on file   Years of education: Not on file   Highest education level: Not on file  Occupational History   Not on file  Tobacco Use   Smoking status: Former   Smokeless tobacco: Never  Substance and Sexual Activity   Alcohol use: No   Drug use: No   Sexual activity: Not on file  Other Topics Concern   Not on file  Social History Narrative   Not on file   Social Determinants of Health   Financial Resource Strain: Not on file  Food Insecurity: Not on file  Transportation Needs: Not on file  Physical Activity: Not on file  Stress: Not on file  Social Connections: Not on file   Outpatient Medications Prior to Visit  Medication Sig   Calcium Carbonate-Vitamin D (CALCIUM 600+D) 600-400 MG-UNIT tablet Take 1 tablet by mouth daily.   Diphenhydramine-APAP, sleep, (EXCEDRIN PM) 38-500 MG TABS Take 2  tablets by mouth at bedtime as needed (for sleep).   Multiple Vitamin (MULTIVITAMIN WITH MINERALS) TABS tablet Take 1 tablet by mouth daily.   ondansetron (ZOFRAN ODT) 4 MG disintegrating tablet Take 1 tablet (4 mg total) by mouth every 8 (eight) hours as needed for nausea or vomiting.   No facility-administered medications prior to visit.   Allergies  Allergen Reactions   Codeine Diarrhea and Nausea And Vomiting     There is no immunization history on file for this patient.  Health Maintenance  Topic Date Due   COVID-19 Vaccine (1) Never done   HIV Screening  Never done   Hepatitis C Screening  Never done   TETANUS/TDAP  Never done   PAP SMEAR-Modifier  Never done   COLONOSCOPY (Pts 45-71yrs Insurance coverage will need to be confirmed)  Never done   MAMMOGRAM  Never done   Zoster Vaccines- Shingrix (1 of 2) Never done   DEXA SCAN  Never done   INFLUENZA VACCINE  Never done   HPV VACCINES  Aged Out    Patient Care Team: Patient, No Pcp Per (Inactive) as PCP - General (General Practice)  Review of Systems  Constitutional:  Positive for appetite change, chills and diaphoresis.       Crying  HENT:  Positive for dental problem, drooling, hearing loss, mouth sores, trouble  swallowing and voice change.   Eyes:  Positive for visual disturbance.  Cardiovascular:  Positive for chest pain and palpitations.  Gastrointestinal:  Positive for abdominal pain.  Genitourinary:  Positive for enuresis and frequency.  Musculoskeletal:  Positive for arthralgias, back pain, myalgias, neck pain and neck stiffness.  Hematological:  Bruises/bleeds easily.  Psychiatric/Behavioral:  Positive for agitation, decreased concentration and sleep disturbance. The patient is nervous/anxious.        Irritability  All other systems reviewed and are negative.    Objective    There were no vitals taken for this visit. Physical Exam Vitals reviewed.  Constitutional:      General: She is not in acute  distress.    Appearance: She is well-developed.     Comments: Cachectic white female in no acute distress  HENT:     Head: Normocephalic and atraumatic.     Right Ear: Hearing and external ear normal.     Left Ear: Hearing and external ear normal.     Nose: Nose normal.     Mouth/Throat:     Comments: She is a edentulous Eyes:     General: Lids are normal. No scleral icterus.       Right eye: No discharge.        Left eye: No discharge.     Conjunctiva/sclera: Conjunctivae normal.  Cardiovascular:     Rate and Rhythm: Normal rate and regular rhythm.     Heart sounds: Normal heart sounds.  Pulmonary:     Effort: Pulmonary effort is normal. No respiratory distress.  Chest:  Breasts:    Right: Normal.     Left: Normal.  Abdominal:     Palpations: Abdomen is soft.  Genitourinary:    General: Normal vulva.     Vagina: No vaginal discharge.     Comments: Mildly atrophic external genitalia.  Mucosa and cervix appear normal. Skin:    Findings: No lesion or rash.  Neurological:     General: No focal deficit present.     Mental Status: She is alert and oriented to person, place, and time.  Psychiatric:        Mood and Affect: Mood normal.        Speech: Speech normal.        Behavior: Behavior normal.        Thought Content: Thought content normal.        Judgment: Judgment normal.   GAD-7 score is 18 PHQ-9 is 18 Depression Screen No flowsheet data found. No results found for any visits on 11/11/20.  Assessment & Plan      1.  depression Follow-up about 3 months old sertraline - sertraline (ZOLOFT) 50 MG tablet; Take 1 tablet (50 mg total) by mouth daily.  Dispense: 30 tablet; Refill: 3  2. Anxiety Avoid benzodiazepine  3. Encounter to establish care   4. Encounter for hepatitis C screening test for low risk patient  - Hepatitis C antibody  5. Screen for STD (sexually transmitted disease)  - HIV Antibody (routine testing w rflx) - RPR  6. Family history of  heart disease  - CBC with Differential/Platelet - Comprehensive metabolic panel - Lipid Panel With LDL/HDL Ratio - TSH  7. Family history of kidney disease  - Comprehensive metabolic panel  8. Pelvic pain Need for Pap smear.  Last 1 is normal. - Cytology - PAP  9. Raynaud's disease without gangrene Low-dose amlodipine for description of Raynaud's disease - amLODipine (NORVASC) 2.5 MG tablet; Take  1 tablet (2.5 mg total) by mouth daily.  Dispense: 90 tablet; Refill: 3  10. Encounter for screening mammogram for malignant neoplasm of breast  - MM 3D SCREEN BREAST BILATERAL; Future  11. Colon cancer screening Patient willing to do colonoscopy. - Ambulatory referral to Gastroenterology   No follow-ups on file.     I, Wilhemena Durie, MD, have reviewed all documentation for this visit. The documentation on 11/16/20 for the exam, diagnosis, procedures, and orders are all accurate and complete.    Lyndee Herbst Cranford Mon, MD  [facility] [phone] (phone) [phone] (fax)  [place]

## 2020-11-14 ENCOUNTER — Telehealth: Payer: Self-pay | Admitting: *Deleted

## 2020-11-14 ENCOUNTER — Telehealth: Payer: Self-pay

## 2020-11-14 LAB — CYTOLOGY - PAP
Chlamydia: NEGATIVE
Comment: NEGATIVE
Comment: NEGATIVE
Comment: NORMAL
Diagnosis: NEGATIVE
High risk HPV: NEGATIVE
Neisseria Gonorrhea: NEGATIVE

## 2020-11-14 LAB — CBC WITH DIFFERENTIAL/PLATELET
Basophils Absolute: 0.1 10*3/uL (ref 0.0–0.2)
Basos: 1 %
EOS (ABSOLUTE): 0.3 10*3/uL (ref 0.0–0.4)
Eos: 5 %
Hematocrit: 40.9 % (ref 34.0–46.6)
Hemoglobin: 13.8 g/dL (ref 11.1–15.9)
Immature Grans (Abs): 0 10*3/uL (ref 0.0–0.1)
Immature Granulocytes: 0 %
Lymphocytes Absolute: 2 10*3/uL (ref 0.7–3.1)
Lymphs: 29 %
MCH: 32.1 pg (ref 26.6–33.0)
MCHC: 33.7 g/dL (ref 31.5–35.7)
MCV: 95 fL (ref 79–97)
Monocytes Absolute: 0.6 10*3/uL (ref 0.1–0.9)
Monocytes: 8 %
Neutrophils Absolute: 4 10*3/uL (ref 1.4–7.0)
Neutrophils: 57 %
Platelets: 273 10*3/uL (ref 150–450)
RBC: 4.3 x10E6/uL (ref 3.77–5.28)
RDW: 12.6 % (ref 11.7–15.4)
WBC: 7 10*3/uL (ref 3.4–10.8)

## 2020-11-14 LAB — COMPREHENSIVE METABOLIC PANEL
ALT: 17 IU/L (ref 0–32)
AST: 19 IU/L (ref 0–40)
Albumin/Globulin Ratio: 1.8 (ref 1.2–2.2)
Albumin: 4.3 g/dL (ref 3.8–4.8)
Alkaline Phosphatase: 116 IU/L (ref 44–121)
BUN/Creatinine Ratio: 15 (ref 12–28)
BUN: 13 mg/dL (ref 8–27)
Bilirubin Total: 0.2 mg/dL (ref 0.0–1.2)
CO2: 22 mmol/L (ref 20–29)
Calcium: 9.7 mg/dL (ref 8.7–10.3)
Chloride: 102 mmol/L (ref 96–106)
Creatinine, Ser: 0.84 mg/dL (ref 0.57–1.00)
Globulin, Total: 2.4 g/dL (ref 1.5–4.5)
Glucose: 97 mg/dL (ref 70–99)
Potassium: 4.8 mmol/L (ref 3.5–5.2)
Sodium: 137 mmol/L (ref 134–144)
Total Protein: 6.7 g/dL (ref 6.0–8.5)
eGFR: 77 mL/min/{1.73_m2} (ref >59)

## 2020-11-14 LAB — LIPID PANEL WITH LDL/HDL RATIO
Cholesterol, Total: 315 mg/dL — ABNORMAL HIGH (ref 100–199)
HDL: 52 mg/dL (ref >39)
LDL Chol Calc (NIH): 250 mg/dL — ABNORMAL HIGH (ref 0–99)
LDL/HDL Ratio: 4.8 ratio — ABNORMAL HIGH (ref 0.0–3.2)
Triglycerides: 83 mg/dL (ref 0–149)
VLDL Cholesterol Cal: 13 mg/dL (ref 5–40)

## 2020-11-14 LAB — HEPATITIS C ANTIBODY: Hep C Virus Ab: 0.1 s/co ratio (ref 0.0–0.9)

## 2020-11-14 LAB — HIV ANTIBODY (ROUTINE TESTING W REFLEX): HIV Screen 4th Generation wRfx: NONREACTIVE

## 2020-11-14 LAB — RPR: RPR Ser Ql: NONREACTIVE

## 2020-11-14 LAB — TSH: TSH: 3.07 u[IU]/mL (ref 0.450–4.500)

## 2020-11-14 MED ORDER — ROSUVASTATIN CALCIUM 20 MG PO TABS: 20.0000 mg | ORAL_TABLET | Freq: Every day | ORAL | 3 refills | Status: DC

## 2020-11-14 NOTE — Telephone Encounter (Signed)
-----   Message from Jerrol Banana., MD sent at 11/14/2020  7:54 AM EDT ----- Labs stable but cholesterol too high.  Recommend rosuvastatin 20 mg daily. Please advise patient.

## 2020-11-14 NOTE — Telephone Encounter (Signed)
Patient advised by [facility] NT.

## 2020-11-14 NOTE — Telephone Encounter (Signed)
Patient is calling for lab results: Labs stable but cholesterol too high.  Recommend rosuvastatin 20 mg daily. Please advise patient.   Patient notified- [facility]/ [place]

## 2020-11-24 ENCOUNTER — Telehealth: Payer: Self-pay

## 2020-11-24 NOTE — Telephone Encounter (Signed)
Pt. Calling to schedule colonoscopy 

## 2020-11-26 NOTE — Telephone Encounter (Signed)
Returned patients call. LVM to call back. A reminder letter has been mailed out.

## 2020-12-22 ENCOUNTER — Telehealth: Payer: Self-pay

## 2020-12-22 NOTE — Telephone Encounter (Signed)
Returned patients call. LVM to call office back. 

## 2020-12-22 NOTE — Telephone Encounter (Signed)
Patient is ready to schedule procedure. Clinical staff will follow up with patient. °

## 2021-01-22 ENCOUNTER — Other Ambulatory Visit: Payer: Self-pay

## 2021-01-22 DIAGNOSIS — Z1211 Encounter for screening for malignant neoplasm of colon: Secondary | ICD-10-CM

## 2021-01-22 MED ORDER — PEG 3350-KCL-NA BICARB-NACL 420 G PO SOLR: 4000.0000 mL | Freq: Once | ORAL | 0 refills | Status: AC

## 2021-01-22 NOTE — Telephone Encounter (Signed)
Pt called back to sch appt

## 2021-01-22 NOTE — Telephone Encounter (Signed)
Procedure scheduled for 02/17/21.

## 2021-01-22 NOTE — Progress Notes (Signed)
Gastroenterology Pre-Procedure Review  Request Date: 02/17/2021 Requesting Physician: Dr. Vicente Males  PATIENT REVIEW QUESTIONS: The patient responded to the following health history questions as indicated:  Spoke with patient's niece Shari Rivera to answer questions.  1. Are you having any GI issues? no 2. Do you have a personal history of Polyps? no 3. Do you have a family history of Colon Cancer or Polyps? no 4. Diabetes Mellitus? no 5. Joint replacements in the past 12 months?no 6. Major health problems in the past 3 months?no 7. Any artificial heart valves, MVP, or defibrillator?no    MEDICATIONS & ALLERGIES:    Patient reports the following regarding taking any anticoagulation/antiplatelet therapy:   Plavix, Coumadin, Eliquis, Xarelto, Lovenox, Pradaxa, Brilinta, or Effient? no Aspirin? no  Patient confirms/reports the following medications:  Current Outpatient Medications  Medication Sig Dispense Refill   amLODipine (NORVASC) 2.5 MG tablet Take 1 tablet (2.5 mg total) by mouth daily. 90 tablet 3   Calcium Carbonate-Vitamin D 600-400 MG-UNIT tablet Take 1 tablet by mouth daily.     ibuprofen (ADVIL) 200 MG tablet Take 200 mg by mouth every 6 (six) hours as needed.     Ibuprofen-diphenhydrAMINE Cit (IBUPROFEN PM PO) Take by mouth.     Multiple Vitamin (MULTIVITAMIN WITH MINERALS) TABS tablet Take 1 tablet by mouth daily.     omeprazole (PRILOSEC) 20 MG capsule Take 20 mg by mouth daily.     rosuvastatin (CRESTOR) 20 MG tablet Take 1 tablet (20 mg total) by mouth daily. 90 tablet 3   sertraline (ZOLOFT) 50 MG tablet Take 1 tablet (50 mg total) by mouth daily. 30 tablet 3   No current facility-administered medications for this visit.    Patient confirms/reports the following allergies:  Allergies  Allergen Reactions   Codeine Diarrhea and Nausea And Vomiting    No orders of the defined types were placed in this encounter.   AUTHORIZATION INFORMATION Primary  Insurance: 1D#: Group #:  Secondary Insurance: 1D#: Group #:  SCHEDULE INFORMATION: Date: 02/17/2021 Time: Location: [place]

## 2021-02-05 ENCOUNTER — Other Ambulatory Visit: Payer: Self-pay

## 2021-02-05 ENCOUNTER — Ambulatory Visit
Admission: RE | Admit: 2021-02-05 | Discharge: 2021-02-05 | Disposition: A | Discharge status: Home / Self Care | Payer: Medicare Other | Source: Ambulatory Visit | Attending: Family Medicine | Admitting: Family Medicine

## 2021-02-05 DIAGNOSIS — Z1231 Encounter for screening mammogram for malignant neoplasm of breast: Secondary | ICD-10-CM | POA: Insufficient documentation

## 2021-02-12 ENCOUNTER — Telehealth: Payer: Self-pay | Admitting: Gastroenterology

## 2021-02-12 NOTE — Telephone Encounter (Signed)
Procedure has been cancelled per patients request. Endo unit has been notified of change.

## 2021-02-12 NOTE — Telephone Encounter (Signed)
Inbound call from pt requesting to cxl her procedure. Pt stated that she has a work conflict and will call back to r/s her procedure. Thank you.

## 2021-02-17 ENCOUNTER — Ambulatory Visit: Admit: 2021-02-17 | Payer: Medicare Other | Admitting: Gastroenterology

## 2021-02-17 SURGERY — COLONOSCOPY WITH PROPOFOL
Anesthesia: General

## 2021-03-12 ENCOUNTER — Other Ambulatory Visit: Payer: Self-pay | Admitting: Family Medicine

## 2021-03-12 DIAGNOSIS — F3289 Other specified depressive episodes: Secondary | ICD-10-CM

## 2021-03-12 NOTE — Telephone Encounter (Signed)
Requested medications are due for refill today.  yes  Requested medications are on the active medications list.  yes  Last refill. 11/11/2020 #30 3 refills  Future visit scheduled.   yes  Notes to clinic.  Medication not delegated.    Requested Prescriptions  Pending Prescriptions Disp Refills   sertraline (ZOLOFT) 50 MG tablet [Pharmacy Med Name: SERTRALINE 50MG  TABLETS] 30 tablet 3    Sig: TAKE 1 TABLET(50 MG) BY MOUTH DAILY     Not Delegated - Psychiatry:  Antidepressants - SSRI - sertraline Failed - 03/12/2021  3:39 AM      Failed - This refill cannot be delegated      Passed - AST in normal range and within 360 days    AST  Date Value Ref Range Status  11/13/2020 19 0 - 40 IU/L Final          Passed - ALT in normal range and within 360 days    ALT  Date Value Ref Range Status  11/13/2020 17 0 - 32 IU/L Final          Passed - Completed PHQ-2 or PHQ-9 in the last 360 days      Passed - Valid encounter within last 6 months    Recent Outpatient Visits           4 months ago Other depression   [facility] Jerrol Banana., MD       Future Appointments             In 1 week Jerrol Banana., MD [facility] [place]

## 2021-03-24 ENCOUNTER — Ambulatory Visit (INDEPENDENT_AMBULATORY_CARE_PROVIDER_SITE_OTHER): Payer: Medicare Other | Admitting: Family Medicine

## 2021-03-24 ENCOUNTER — Other Ambulatory Visit: Payer: Self-pay

## 2021-03-24 VITALS — BP 119/76 | HR 81 | Temp 98.8°F | Wt 126.0 lb

## 2021-03-24 DIAGNOSIS — R202 Paresthesia of skin: Secondary | ICD-10-CM | POA: Diagnosis not present

## 2021-03-24 DIAGNOSIS — K219 Gastro-esophageal reflux disease without esophagitis: Secondary | ICD-10-CM | POA: Diagnosis not present

## 2021-03-24 DIAGNOSIS — E785 Hyperlipidemia, unspecified: Secondary | ICD-10-CM

## 2021-03-24 DIAGNOSIS — M542 Cervicalgia: Secondary | ICD-10-CM | POA: Diagnosis not present

## 2021-03-24 DIAGNOSIS — F419 Anxiety disorder, unspecified: Secondary | ICD-10-CM | POA: Diagnosis not present

## 2021-03-24 DIAGNOSIS — F3341 Major depressive disorder, recurrent, in partial remission: Secondary | ICD-10-CM

## 2021-03-24 MED ORDER — FAMOTIDINE 40 MG PO TABS: 40.0000 mg | ORAL_TABLET | Freq: Every day | ORAL | 11 refills | Status: DC

## 2021-03-24 MED ORDER — SERTRALINE HCL 100 MG PO TABS: 100.0000 mg | ORAL_TABLET | Freq: Every day | ORAL | 11 refills | Status: DC

## 2021-03-24 MED ORDER — CYCLOBENZAPRINE HCL 5 MG PO TABS: 5.0000 mg | ORAL_TABLET | Freq: Every day | ORAL | 5 refills | Status: DC

## 2021-03-24 NOTE — Progress Notes (Signed)
Argentina Ponder De[place]anto,acting as a scribe for Wilhemena Durie, MD.,have documented all relevant documentation on the behalf of Wilhemena Durie, MD,as directed by  Wilhemena Durie, MD while in the presence of Wilhemena Durie, MD.     Established patient visit   Patient: Shari Rivera   DOB: 1955-02-14   66 y.o. Female  MRN: [id number] Visit Date: 03/24/2021  Today's healthcare provider: Wilhemena Durie, MD   No chief complaint on file.  Subjective    HPI  It is scheduled for routine follow-up and she states she feels better with her depression but scored higher on the PHQ-9 from 18 up to now 24.  She is checked and she has multiple random issues that she states are chronic. Tolerating the sertraline well and the statin well. States the amlodipine at low-dose makes her Raynaud's "worse". She complains of chronic neck pain which she states has been going on for years.  Planes of chronic heartburn . Depression, Follow-up  She  was last seen for this 4 months ago. Changes made at last visit include starting the patient on Sertraline. Patient states she is compliant and tolerant of her Sertraline and feels it may be helping a little.  She feels it is keeping her from being angry all the time but it is not helping her anxiety.  She feels her anxiety may be so bad because of her pain.    Depression screen PHQ 2/9 11/11/2020  Decreased Interest 2  Down, Depressed, Hopeless 2  PHQ - 2 Score 4  Altered sleeping 2  Tired, decreased energy 2  Change in appetite 3  Feeling bad or failure about yourself  2  Trouble concentrating 3  Moving slowly or fidgety/restless 0  Suicidal thoughts 2  PHQ-9 Score 18  Difficult doing work/chores Not difficult at all    Patient also complains that she has been having neck pain for 2 years and really wanted to get that addressed today.  She states she has used Ibuprofen with no relief.  Aspracreme helped slightly.  Patient denies known  trauma.   She states she had PT on her neck 40 years ago for a pulled muscle.  Patient also reports having tingling in her hands and pain and tingling in her feet. -----------------------------------------------------------------------------------------  Medications: Outpatient Medications Prior to Visit  Medication Sig   amLODipine (NORVASC) 2.5 MG tablet Take 1 tablet (2.5 mg total) by mouth daily.   Calcium Carbonate-Vitamin D 600-400 MG-UNIT tablet Take 1 tablet by mouth daily.   ibuprofen (ADVIL) 200 MG tablet Take 200 mg by mouth every 6 (six) hours as needed.   Ibuprofen-diphenhydrAMINE Cit (IBUPROFEN PM PO) Take by mouth.   Multiple Vitamin (MULTIVITAMIN WITH MINERALS) TABS tablet Take 1 tablet by mouth daily.   omeprazole (PRILOSEC) 20 MG capsule Take 20 mg by mouth daily.   rosuvastatin (CRESTOR) 20 MG tablet Take 1 tablet (20 mg total) by mouth daily.   sertraline (ZOLOFT) 50 MG tablet TAKE 1 TABLET(50 MG) BY MOUTH DAILY   No facility-administered medications prior to visit.    Review of Systems  Musculoskeletal:  Positive for back pain, myalgias and neck pain.  Psychiatric/Behavioral:  Positive for agitation (improving), confusion, decreased concentration, dysphoric mood and sleep disturbance. Negative for self-injury and suicidal ideas. The patient is nervous/anxious. The patient is not hyperactive.        Objective    There were no vitals taken for this visit. BP Readings from Last 3 Encounters:  03/24/21 119/76  11/11/20 135/68  10/05/17 108/62   Wt Readings from Last 3 Encounters:  03/24/21 126 lb (57.2 kg)  11/11/20 125 lb (56.7 kg)  10/05/17 116 lb (52.6 kg)      Physical Exam Vitals reviewed.  HENT:     Head: Normocephalic and atraumatic.  Eyes:     General: No scleral icterus.    Conjunctiva/sclera: Conjunctivae normal.  Cardiovascular:     Rate and Rhythm: Normal rate and regular rhythm.     Pulses: Normal pulses.     Heart sounds: Normal heart  sounds.  Pulmonary:     Effort: Pulmonary effort is normal.     Breath sounds: Normal breath sounds.  Abdominal:     Palpations: Abdomen is soft.  Skin:    General: Skin is warm and dry.  Neurological:     General: No focal deficit present.     Mental Status: She is alert and oriented to person, place, and time.  Psychiatric:        Mood and Affect: Mood normal.      No results found for any visits on 03/24/21.  Assessment & Plan     1. Neck pain Right versus degenerative disc disease.  Seems to bother her mainly at night.  We will try Flexeril 10 mg at bedtime and see her back in about 3 months.  Not use narcotics. - B12 and Folate Panel - DG Cervical Spine Complete; Future  2. [place] neurology referral. - B12 and Folate Panel - DG Cervical Spine Complete; Future  3. Hyperlipidemia, unspecified hyperlipidemia type Patient tolerating rosuvastatin 20.  Check lipid panel which before showed an LDL of 250 - Comprehensive metabolic panel - Lipid Panel With LDL/HDL Ratio  4. Gastroesophageal reflux disease, unspecified whether esophagitis present GI as patient refuses to make any changes suggested today including changing her PPI - Ambulatory referral to Gastroenterology  5. Anxiety Problem chronic  6. Recurrent major depressive disorder, in partial remission ([place]) States she feels a little better but her PHQ-9 is gone from 18-24.  Time increase sertraline from 50 to 100 mg daily and follow-up in 3 months.  May need psychiatric referral.  He is in no way suicidal or homicidal.   No follow-ups on file.      I, Wilhemena Durie, MD, have reviewed all documentation for this visit. The documentation on 03/27/21 for the exam, diagnosis, procedures, and orders are all accurate and complete.    Reiley Bertagnolli Cranford Mon, MD  [facility] [phone] (phone) [phone] (fax)  [place]

## 2021-03-24 NOTE — Patient Instructions (Addendum)
Discontinue Amlodipine  Discontinue Omeprazole  Discontinue Ibuprofen

## 2021-03-25 ENCOUNTER — Other Ambulatory Visit: Payer: Self-pay

## 2021-03-25 ENCOUNTER — Ambulatory Visit
Admission: RE | Admit: 2021-03-25 | Discharge: 2021-03-25 | Disposition: A | Discharge status: Home / Self Care | Payer: Medicare Other | Source: Ambulatory Visit | Attending: Family Medicine | Admitting: Family Medicine

## 2021-03-25 ENCOUNTER — Ambulatory Visit
Admission: RE | Admit: 2021-03-25 | Discharge: 2021-03-25 | Disposition: A | Discharge status: Home / Self Care | Payer: Medicare Other | Attending: Family Medicine | Admitting: Family Medicine

## 2021-03-25 DIAGNOSIS — M542 Cervicalgia: Secondary | ICD-10-CM | POA: Insufficient documentation

## 2021-03-25 DIAGNOSIS — R202 Paresthesia of skin: Secondary | ICD-10-CM | POA: Diagnosis not present

## 2021-03-26 DIAGNOSIS — E785 Hyperlipidemia, unspecified: Secondary | ICD-10-CM | POA: Diagnosis not present

## 2021-03-26 DIAGNOSIS — R202 Paresthesia of skin: Secondary | ICD-10-CM | POA: Diagnosis not present

## 2021-03-26 DIAGNOSIS — M542 Cervicalgia: Secondary | ICD-10-CM | POA: Diagnosis not present

## 2021-03-27 LAB — LIPID PANEL WITH LDL/HDL RATIO
Cholesterol, Total: 144 mg/dL (ref 100–199)
HDL: 49 mg/dL (ref >39)
LDL Chol Calc (NIH): 79 mg/dL (ref 0–99)
LDL/HDL Ratio: 1.6 ratio (ref 0.0–3.2)
Triglycerides: 80 mg/dL (ref 0–149)
VLDL Cholesterol Cal: 16 mg/dL (ref 5–40)

## 2021-03-27 LAB — B12 AND FOLATE PANEL
Folate: 4.3 ng/mL (ref >3.0)
Vitamin B-12: 219 pg/mL — ABNORMAL LOW (ref 232–1245)

## 2021-03-27 LAB — COMPREHENSIVE METABOLIC PANEL
ALT: 16 IU/L (ref 0–32)
AST: 16 IU/L (ref 0–40)
Albumin/Globulin Ratio: 1.7 (ref 1.2–2.2)
Albumin: 4.2 g/dL (ref 3.8–4.8)
Alkaline Phosphatase: 106 IU/L (ref 44–121)
BUN/Creatinine Ratio: 15 (ref 12–28)
BUN: 13 mg/dL (ref 8–27)
Bilirubin Total: 0.3 mg/dL (ref 0.0–1.2)
CO2: 24 mmol/L (ref 20–29)
Calcium: 9.6 mg/dL (ref 8.7–10.3)
Chloride: 107 mmol/L — ABNORMAL HIGH (ref 96–106)
Creatinine, Ser: 0.88 mg/dL (ref 0.57–1.00)
Globulin, Total: 2.5 g/dL (ref 1.5–4.5)
Glucose: 105 mg/dL — ABNORMAL HIGH (ref 70–99)
Potassium: 4.4 mmol/L (ref 3.5–5.2)
Sodium: 142 mmol/L (ref 134–144)
Total Protein: 6.7 g/dL (ref 6.0–8.5)
eGFR: 72 mL/min/{1.73_m2} (ref >59)

## 2021-03-30 ENCOUNTER — Telehealth: Payer: Self-pay

## 2021-03-30 NOTE — Telephone Encounter (Signed)
CALLED PATIENT NO ANSWER LEFT VOICEMAIL FOR A CALL BACK °Letter sent °

## 2021-04-06 ENCOUNTER — Telehealth: Payer: Self-pay

## 2021-04-06 DIAGNOSIS — R52 Pain, unspecified: Secondary | ICD-10-CM

## 2021-04-06 NOTE — Telephone Encounter (Signed)
-----   Message from Jerrol Banana., MD sent at 04/06/2021  2:08 PM EDT ----- ?OK to refer to neurology for these symptoms. ?----- Message ----- ?From: Doristine Devoid, CMA ?Sent: 04/01/2021   4:52 PM EDT ?To: Jerrol Banana., MD ? ? ?----- Message ----- ?From: Jerrol Banana., MD ?Sent: 03/31/2021   8:12 AM EST ?To: Rosanna Randy Nurse ? ?Arthritic changes of neck.. Please advise patient.  ? ? ?

## 2021-07-13 ENCOUNTER — Telehealth: Payer: Self-pay | Admitting: Family Medicine

## 2021-07-13 NOTE — Telephone Encounter (Signed)
Copied from [place] [id number]. Topic: Medicare AWV >> Jul 13, 2021 11:09 AM Jae Dire wrote: Reason for CRM:  Left message for patient to call back and schedule Medicare Annual Wellness Visit (AWV) in office.   If not able to come in the office, please offer to do virtually or by telephone.   No hx of AWV - AWV-I eligible per [employer] as of 01/25/2021  Please schedule at anytime with [facility] Health Advisor.   45 minute appointment  Any questions, please contact me at [phone]

## 2021-07-24 ENCOUNTER — Ambulatory Visit: Payer: Self-pay

## 2021-07-24 NOTE — Telephone Encounter (Signed)
Pt found a bottle of amlodipine in the bedside table.  She does not know if this is something she is supposed to taking or not.   Pt.reports Dr. Rosanna Randy had discontinued the Amlodipine. Also has not heard back on appointment for EMG. Please advise pt.    Answer Assessment - Initial Assessment Questions 1. NAME of MEDICATION: "What medicine are you calling about?"     Amlodipine 2. QUESTION: "What is your question?" (e.g., double dose of medicine, side effect)     Should I be taking this? I have not been taking it. 3. PRESCRIBING HCP: "Who prescribed it?" Reason: if prescribed by specialist, call should be referred to that group.     Dr. Rosanna Randy 4. SYMPTOMS: "Do you have any symptoms?"     No 5. SEVERITY: If symptoms are present, ask "Are they mild, moderate or severe?"     No 6. PREGNANCY:  "Is there any chance that you are pregnant?" "When was your last menstrual period?"     No  Protocols used: Medication Question Call-A-AH

## 2021-07-24 NOTE — Telephone Encounter (Signed)
Patient advised that at last OV she was advised to D/C amlodipine, omeprazole and ibuprofen. Medication list updated. Patient advised to call neurology and ask about EMG.

## 2021-08-10 ENCOUNTER — Telehealth: Payer: Self-pay | Admitting: Family Medicine

## 2021-08-10 NOTE — Telephone Encounter (Signed)
Copied from [place] [phone]. Topic: Medicare AWV >> Aug 10, 2021  2:34 PM Jae Dire wrote: Reason for CRM:  Left message for patient to call back and schedule Medicare Annual Wellness Visit (AWV) in office.   If not able to come in the office, please offer to do virtually or by telephone.   No hx of AWV - AWV-I eligible per [employer] as of  01/25/2021  Please schedule at anytime with [facility] Health Advisor.   45 minute appointment  Any questions, please contact me at [phone]

## 2021-09-02 ENCOUNTER — Telehealth: Payer: Self-pay | Admitting: Family Medicine

## 2021-09-02 NOTE — Telephone Encounter (Signed)
Copied from [place] [phone]. Topic: Medicare AWV >> Sep 02, 2021  2:01 PM Jae Dire wrote: Reason for CRM:  Left message for patient to call back and schedule Medicare Annual Wellness Visit (AWV) in office.   If not able to come in the office, please offer to do virtually or by telephone.   No hx of AWV - AWV-I eligible per [employer] as of 01/25/2021  Please schedule at anytime with [facility] Health Advisor.   45 minute appointment  Any questions, please contact me at [phone]

## 2021-09-17 ENCOUNTER — Telehealth: Payer: Self-pay | Admitting: Family Medicine

## 2021-09-17 NOTE — Telephone Encounter (Signed)
[facility] faxed refill request for the following medications:  cyclobenzaprine (FLEXERIL) 5 MG tablet   Note from pharmacy:  New pt to our pharmacy.  Rx out of refills and can't transfer to our pharmacy.  Please send new Rx.  Please advise.

## 2021-09-18 MED ORDER — CYCLOBENZAPRINE HCL 5 MG PO TABS: 5.0000 mg | ORAL_TABLET | Freq: Every day | ORAL | 5 refills | Status: DC

## 2021-09-21 ENCOUNTER — Telehealth: Payer: Self-pay | Admitting: Family Medicine

## 2021-09-21 DIAGNOSIS — E785 Hyperlipidemia, unspecified: Secondary | ICD-10-CM

## 2021-09-21 DIAGNOSIS — F3341 Major depressive disorder, recurrent, in partial remission: Secondary | ICD-10-CM

## 2021-09-21 DIAGNOSIS — K219 Gastro-esophageal reflux disease without esophagitis: Secondary | ICD-10-CM

## 2021-09-21 MED ORDER — FAMOTIDINE 40 MG PO TABS: 40.0000 mg | ORAL_TABLET | Freq: Every day | ORAL | 11 refills | Status: DC

## 2021-09-21 MED ORDER — ROSUVASTATIN CALCIUM 20 MG PO TABS: 20.0000 mg | ORAL_TABLET | Freq: Every day | ORAL | 3 refills | Status: DC

## 2021-09-21 MED ORDER — SERTRALINE HCL 100 MG PO TABS: 100.0000 mg | ORAL_TABLET | Freq: Every day | ORAL | 0 refills | Status: DC

## 2021-09-21 NOTE — Telephone Encounter (Signed)
Patient is transferring pharmacies. She is now using [facility].   She needs Sertraline 100 mg., Amlodipine 2.5 mg., Rosuvastatin 20 mg, and Pepcid 40 mg. sent to [facility].

## 2021-09-22 ENCOUNTER — Ambulatory Visit: Payer: Medicare Other

## 2021-09-22 ENCOUNTER — Telehealth: Payer: Self-pay

## 2021-09-22 NOTE — Telephone Encounter (Signed)
Copied from [place] [id number]. Topic: General - Other >> Sep 22, 2021  9:58 AM Cyndi Bender wrote: Reason for CRM: Pt reports that she is having issues with her phone and wanted to know if it possible that the Medicare AWV could be done in the office because she is afraid to lose her benefits due to missed appts.

## 2021-10-05 ENCOUNTER — Ambulatory Visit (INDEPENDENT_AMBULATORY_CARE_PROVIDER_SITE_OTHER): Payer: Medicare Other

## 2021-10-05 VITALS — Wt 126.0 lb

## 2021-10-05 DIAGNOSIS — Z780 Asymptomatic menopausal state: Secondary | ICD-10-CM | POA: Diagnosis not present

## 2021-10-05 DIAGNOSIS — Z0000 Encounter for general adult medical examination without abnormal findings: Secondary | ICD-10-CM | POA: Diagnosis not present

## 2021-10-05 DIAGNOSIS — Z1211 Encounter for screening for malignant neoplasm of colon: Secondary | ICD-10-CM

## 2021-10-05 NOTE — Patient Instructions (Signed)
Shari Rivera , Thank you for taking time to come for your Medicare Wellness Visit. I appreciate your ongoing commitment to your health goals. Please review the following plan we discussed and let me know if I can assist you in the future.   Screening recommendations/referrals: Colonoscopy: referral sent Mammogram: 02/05/21 Bone Density: referral sent Recommended yearly ophthalmology/optometry visit for glaucoma screening and checkup Recommended yearly dental visit for hygiene and checkup  Vaccinations: Influenza vaccine: n/d Pneumococcal vaccine: n/d Tdap vaccine: n/d Shingles vaccine: n/d   Covid-19:n/d  Advanced directives: no  Conditions/risks identified: none  Next appointment: Follow up in one year for your annual wellness visit 10/07/22 @ 9 am by phone   Preventive Care 65 Years and Older, Female Preventive care refers to lifestyle choices and visits with your health care provider that can promote health and wellness. What does preventive care include? A yearly physical exam. This is also called an annual well check. Dental exams once or twice a year. Routine eye exams. Ask your health care provider how often you should have your eyes checked. Personal lifestyle choices, including: Daily care of your teeth and gums. Regular physical activity. Eating a healthy diet. Avoiding tobacco and drug use. Limiting alcohol use. Practicing safe sex. Taking low-dose aspirin every day. Taking vitamin and mineral supplements as recommended by your health care provider. What happens during an annual well check? The services and screenings done by your health care provider during your annual well check will depend on your age, overall health, lifestyle risk factors, and family history of disease. Counseling  Your health care provider may ask you questions about your: Alcohol use. Tobacco use. Drug use. Emotional well-being. Home and relationship well-being. Sexual activity. Eating  habits. History of falls. Memory and ability to understand (cognition). Work and work Statistician. Reproductive health. Screening  You may have the following tests or measurements: Height, weight, and BMI. Blood pressure. Lipid and cholesterol levels. These may be checked every 5 years, or more frequently if you are over 63 years old. Skin check. Lung cancer screening. You may have this screening every year starting at age 62 if you have a 30-pack-year history of smoking and currently smoke or have quit within the past 15 years. Fecal occult blood test (FOBT) of the stool. You may have this test every year starting at age 67. Flexible sigmoidoscopy or colonoscopy. You may have a sigmoidoscopy every 5 years or a colonoscopy every 10 years starting at age 67. Hepatitis C blood test. Hepatitis B blood test. Sexually transmitted disease (STD) testing. Diabetes screening. This is done by checking your blood sugar (glucose) after you have not eaten for a while (fasting). You may have this done every 1-3 years. Bone density scan. This is done to screen for osteoporosis. You may have this done starting at age 34. Mammogram. This may be done every 1-2 years. Talk to your health care provider about how often you should have regular mammograms. Talk with your health care provider about your test results, treatment options, and if necessary, the need for more tests. Vaccines  Your health care provider may recommend certain vaccines, such as: Influenza vaccine. This is recommended every year. Tetanus, diphtheria, and acellular pertussis (Tdap, Td) vaccine. You may need a Td booster every 10 years. Zoster vaccine. You may need this after age 41. Pneumococcal 13-valent conjugate (PCV13) vaccine. One dose is recommended after age 2. Pneumococcal polysaccharide (PPSV23) vaccine. One dose is recommended after age 70. Talk to your health care  provider about which screenings and vaccines you need and how  often you need them. This information is not intended to replace advice given to you by your health care provider. Make sure you discuss any questions you have with your health care provider. Document Released: 02/07/2015 Document Revised: 10/01/2015 Document Reviewed: 11/12/2014 Elsevier Interactive Patient Education  2017 [employer] Prevention in the Home Falls can cause injuries. They can happen to people of all ages. There are many things you can do to make your home safe and to help prevent falls. What can I do on the outside of my home? Regularly fix the edges of walkways and driveways and fix any cracks. Remove anything that might make you trip as you walk through a door, such as a raised step or threshold. Trim any bushes or trees on the path to your home. Use bright outdoor lighting. Clear any walking paths of anything that might make someone trip, such as rocks or tools. Regularly check to see if handrails are loose or broken. Make sure that both sides of any steps have handrails. Any raised decks and porches should have guardrails on the edges. Have any leaves, snow, or ice cleared regularly. Use sand or salt on walking paths during winter. Clean up any spills in your garage right away. This includes oil or grease spills. What can I do in the bathroom? Use night lights. Install grab bars by the toilet and in the tub and shower. Do not use towel bars as grab bars. Use non-skid mats or decals in the tub or shower. If you need to sit down in the shower, use a plastic, non-slip stool. Keep the floor dry. Clean up any water that spills on the floor as soon as it happens. Remove soap buildup in the tub or shower regularly. Attach bath mats securely with double-sided non-slip rug tape. Do not have throw rugs and other things on the floor that can make you trip. What can I do in the bedroom? Use night lights. Make sure that you have a light by your bed that is easy to  reach. Do not use any sheets or blankets that are too big for your bed. They should not hang down onto the floor. Have a firm chair that has side arms. You can use this for support while you get dressed. Do not have throw rugs and other things on the floor that can make you trip. What can I do in the kitchen? Clean up any spills right away. Avoid walking on wet floors. Keep items that you use a lot in easy-to-reach places. If you need to reach something above you, use a strong step stool that has a grab bar. Keep electrical cords out of the way. Do not use floor polish or wax that makes floors slippery. If you must use wax, use non-skid floor wax. Do not have throw rugs and other things on the floor that can make you trip. What can I do with my stairs? Do not leave any items on the stairs. Make sure that there are handrails on both sides of the stairs and use them. Fix handrails that are broken or loose. Make sure that handrails are as long as the stairways. Check any carpeting to make sure that it is firmly attached to the stairs. Fix any carpet that is loose or worn. Avoid having throw rugs at the top or bottom of the stairs. If you do have throw rugs, attach them to the  floor with carpet tape. Make sure that you have a light switch at the top of the stairs and the bottom of the stairs. If you do not have them, ask someone to add them for you. What else can I do to help prevent falls? Wear shoes that: Do not have high heels. Have rubber bottoms. Are comfortable and fit you well. Are closed at the toe. Do not wear sandals. If you use a stepladder: Make sure that it is fully opened. Do not climb a closed stepladder. Make sure that both sides of the stepladder are locked into place. Ask someone to hold it for you, if possible. Clearly mark and make sure that you can see: Any grab bars or handrails. First and last steps. Where the edge of each step is. Use tools that help you move  around (mobility aids) if they are needed. These include: Canes. Walkers. Scooters. Crutches. Turn on the lights when you go into a dark area. Replace any light bulbs as soon as they burn out. Set up your furniture so you have a clear path. Avoid moving your furniture around. If any of your floors are uneven, fix them. If there are any pets around you, be aware of where they are. Review your medicines with your doctor. Some medicines can make you feel dizzy. This can increase your chance of falling. Ask your doctor what other things that you can do to help prevent falls. This information is not intended to replace advice given to you by your health care provider. Make sure you discuss any questions you have with your health care provider. Document Released: 11/07/2008 Document Revised: 06/19/2015 Document Reviewed: 02/15/2014 Elsevier Interactive Patient Education  2017 [employer].

## 2021-10-05 NOTE — Progress Notes (Signed)
Virtual Visit via Telephone Note  I connected with  Shari Rivera on 10/05/21 at  8:45 AM EDT by telephone and verified that I am speaking with the correct person using two identifiers.  Location: Patient: home Provider: [facility] Persons participating in the virtual visit: J[place]   I discussed the limitations, risks, security and privacy concerns of performing an evaluation and management service by telephone and the availability of in person appointments. The patient expressed understanding and agreed to proceed.  Interactive audio and video telecommunications were attempted between this nurse and patient, however failed, due to patient having technical difficulties OR patient did not have access to video capability.  We continued and completed visit with audio only.  Some vital signs may be absent or patient reported.   Dionisio David, LPN  Subjective:   Shari Rivera is a 66 y.o. female who presents for Medicare Annual (Subsequent) preventive examination.  Review of Systems     Cardiac Risk Factors include: advanced age (>42mn, >>55women)     Objective:    There were no vitals filed for this visit. There is no height or weight on file to calculate BMI.     10/05/2021    8:49 AM 10/05/2017    8:55 AM 05/27/2015   11:04 PM  Advanced Directives  Does Patient Have a Medical Advance Directive? No No No  Would patient like information on creating a medical advance directive? No - Patient declined  No - patient declined information    Current Medications (verified) Outpatient Encounter Medications as of 10/05/2021  Medication Sig   amLODipine (NORVASC) 2.5 MG tablet Take 2.5 mg by mouth daily.   Calcium Carbonate-Vitamin D 600-400 MG-UNIT tablet Take 1 tablet by mouth daily.   cyclobenzaprine (FLEXERIL) 10 MG tablet Take by mouth.   cyclobenzaprine (FLEXERIL) 5 MG tablet Take 1 tablet (5 mg total) by mouth at bedtime.   famotidine (PEPCID) 40 MG tablet  Take 1 tablet (40 mg total) by mouth at bedtime.   gabapentin (NEURONTIN) 100 MG capsule Take 100 mg twice a day for one week, then increase to 200 mg(2 tablets) twice a day and continue   ibuprofen (ADVIL) 200 MG tablet Take by mouth.   Ibuprofen-diphenhydrAMINE Cit (IBUPROFEN PM PO) Take by mouth.   Multiple Vitamin (MULTIVITAMIN WITH MINERALS) TABS tablet Take 1 tablet by mouth daily.   omeprazole (PRILOSEC) 10 MG capsule Take by mouth.   omeprazole (PRILOSEC) 20 MG capsule Take 20 mg by mouth daily.   rosuvastatin (CRESTOR) 20 MG tablet Take 1 tablet (20 mg total) by mouth daily.   sertraline (ZOLOFT) 100 MG tablet Take 1 tablet (100 mg total) by mouth daily.   No facility-administered encounter medications on file as of 10/05/2021.    Allergies (verified) Codeine   History: Past Medical History:  Diagnosis Date   Allergy    Anemia    Anxiety    Arthritis    Cancer ([place]    Cataract    Depression    Endometriosis    Past Surgical History:  Procedure Laterality Date   APPENDECTOMY     NASAL SEPTUM SURGERY     SMALL INTESTINE SURGERY     Family History  Problem Relation Age of Onset   Cancer Mother    Stroke Mother    COPD Mother    Heart failure Mother    Atrial fibrillation Mother    Heart disease Father    Cancer Maternal Grandmother  Diabetes Maternal Grandmother    Kidney disease Maternal Grandfather    Heart disease Maternal Grandfather    Heart disease Paternal Grandfather    Breast cancer Other        mggm   Social History   Socioeconomic History   Marital status: Single    Spouse name: Not on file   Number of children: Not on file   Years of education: Not on file   Highest education level: Not on file  Occupational History   Not on file  Tobacco Use   Smoking status: Former   Smokeless tobacco: Never  Vaping Use   Vaping Use: Former  Substance and Sexual Activity   Alcohol use: No   Drug use: No   Sexual activity: Not on file  Other  Topics Concern   Not on file  Social History Narrative   Not on file   Social Determinants of Health   Financial Resource Strain: Low Risk  (10/05/2021)   Overall Financial Resource Strain (CARDIA)    Difficulty of Paying Living Expenses: Not very hard  Food Insecurity: No Food Insecurity (10/05/2021)   Hunger Vital Sign    Worried About Running Out of Food in the Last Year: Never true    Ran Out of Food in the Last Year: Never true  Transportation Needs: No Transportation Needs (10/05/2021)   PRAPARE - Hydrologist (Medical): No    Lack of Transportation (Non-Medical): No  Physical Activity: Sufficiently Active (10/05/2021)   Exercise Vital Sign    Days of Exercise per Week: 5 days    Minutes of Exercise per Session: 60 min  Stress: No Stress Concern Present (10/05/2021)   [place]    Feeling of Stress : Only a little  Social Connections: Moderately Isolated (10/05/2021)   Social Connection and Isolation Panel [NHANES]    Frequency of Communication with Friends and Family: More than three times a week    Frequency of Social Gatherings with Friends and Family: Twice a week    Attends Religious Services: More than 4 times per year    Active Member of [employer] or Organizations: No    Attends Archivist Meetings: Never    Marital Status: Never married    Tobacco Counseling Counseling given: Not Answered   Clinical Intake:  Pre-visit preparation completed: Yes  Pain : No/denies pain     Nutritional Risks: None     Diabetic?no         Activities of Daily Living    10/05/2021    8:52 AM 11/11/2020    4:07 PM  In your present state of health, do you have any difficulty performing the following activities:  Hearing? 0 1  Vision? 1 1  Difficulty concentrating or making decisions? 0 1  Walking or climbing stairs? 0 0  Dressing or bathing? 0 0  Doing errands,  shopping? 0 0  Preparing Food and eating ? N   Using the Toilet? N   In the past six months, have you accidently leaked urine? N   Do you have problems with loss of bowel control? N   Managing your Medications? N   Managing your Finances? N   Housekeeping or managing your Housekeeping? N     Patient Care Team: Jerrol Banana., MD as PCP - General (Family Medicine)  Indicate any recent Medical Services you may have received from other than [facility] providers  in the past year (date may be approximate).     Assessment:   This is a routine wellness examination for [facility].  Hearing/Vision screen Hearing Screening - Comments:: No aids Vision Screening - Comments:: Dr.Bell  Dietary issues and exercise activities discussed: Current Exercise Habits: Home exercise routine, Type of exercise: walking, Time (Minutes): 60, Frequency (Times/Week): 5, Weekly Exercise (Minutes/Week): 300, Intensity: Mild   Goals Addressed             This Visit's Progress    DIET - EAT MORE FRUITS AND VEGETABLES         Depression Screen    10/05/2021    8:45 AM 11/11/2020    4:06 PM  PHQ 2/9 Scores  PHQ - 2 Score 2 4  PHQ- 9 Score 4 18    Fall Risk    10/05/2021    8:50 AM 11/11/2020    4:06 PM  Fall Risk   Falls in the past year? 1 0  Number falls in past yr: 1 0  Injury with Fall? 0 0  Risk for fall due to : Impaired vision;History of fall(s)   Follow up Falls prevention discussed;Falls evaluation completed     FALL RISK PREVENTION PERTAINING TO THE HOME:  Any stairs in or around the home? No  If so, are there any without handrails? No  Home free of loose throw rugs in walkways, pet beds, electrical cords, etc? Yes  Adequate lighting in your home to reduce risk of falls? Yes   ASSISTIVE DEVICES UTILIZED TO PREVENT FALLS:  Life alert? Yes  Use of a cane, walker or w/c? No  Grab bars in the bathroom? Yes  Shower chair or bench in shower? No  Elevated toilet seat or a  handicapped toilet? No    Cognitive Function:        10/05/2021    8:54 AM  6CIT Screen  What Year? 0 points  What month? 0 points  What time? 0 points  Count back from 20 0 points  Months in reverse 0 points  Repeat phrase 0 points  Total Score 0 points    Immunizations  There is no immunization history on file for this patient.  TDAP status: Due, Education has been provided regarding the importance of this vaccine. Advised may receive this vaccine at local pharmacy or Health Dept. Aware to provide a copy of the vaccination record if obtained from local pharmacy or Health Dept. Verbalized acceptance and understanding.  Flu Vaccine status: Declined, Education has been provided regarding the importance of this vaccine but patient still declined. Advised may receive this vaccine at local pharmacy or Health Dept. Aware to provide a copy of the vaccination record if obtained from local pharmacy or Health Dept. Verbalized acceptance and understanding.  Pneumococcal vaccine status: Declined,  Education has been provided regarding the importance of this vaccine but patient still declined. Advised may receive this vaccine at local pharmacy or Health Dept. Aware to provide a copy of the vaccination record if obtained from local pharmacy or Health Dept. Verbalized acceptance and understanding.   Covid-19 vaccine status: Completed vaccines  Qualifies for Shingles Vaccine? Yes   Zostavax completed No   Shingrix Completed?: No.    Education has been provided regarding the importance of this vaccine. Patient has been advised to call insurance company to determine out of pocket expense if they have not yet received this vaccine. Advised may also receive vaccine at local pharmacy or Health Dept. Verbalized acceptance and  understanding.  Screening Tests Health Maintenance  Topic Date Due   COVID-19 Vaccine (1) Never done   Pneumonia Vaccine 59+ Years old (1 - PCV) Never done   TETANUS/TDAP   Never done   COLONOSCOPY (Pts 45-27yr Insurance coverage will need to be confirmed)  Never done   Zoster Vaccines- Shingrix (1 of 2) Never done   DEXA SCAN  Never done   INFLUENZA VACCINE  Never done   MAMMOGRAM  02/05/2022   Hepatitis C Screening  Completed   HPV VACCINES  Aged Out    Health Maintenance  Health Maintenance Due  Topic Date Due   COVID-19 Vaccine (1) Never done   Pneumonia Vaccine 66 Years old (1 - PCV) Never done   TETANUS/TDAP  Never done   COLONOSCOPY (Pts 45-47yrInsurance coverage will need to be confirmed)  Never done   Zoster Vaccines- Shingrix (1 of 2) Never done   DEXA SCAN  Never done   INFLUENZA VACCINE  Never done    Colorectal cancer screening: Referral to GI placed today. Pt aware the office will call re: appt.  Mammogram status: Completed 02/05/21. Repeat every year  Bone Density status: Ordered today. Pt provided with contact info and advised to call to schedule appt.  Lung Cancer Screening: (Low Dose CT Chest recommended if Age 66-80ears, 30 pack-year currently smoking OR have quit w/in 15years.) does not qualify.    Additional Screening:  Hepatitis C Screening: does qualify; Completed 11/13/20  Vision Screening: Recommended annual ophthalmology exams for early detection of glaucoma and other disorders of the eye. Is the patient up to date with their annual eye exam?  Yes  Who is the provider or what is the name of the office in which the patient attends annual eye exams? Dr.Bell If pt is not established with a provider, would they like to be referred to a provider to establish care? No .   Dental Screening: Recommended annual dental exams for proper oral hygiene  Community Resource Referral / Chronic Care Management: CRR required this visit?  No   CCM required this visit?  No      Plan:     I have personally reviewed and noted the following in the patient's chart:   Medical and social history Use of alcohol, tobacco or  illicit drugs  Current medications and supplements including opioid prescriptions. Patient is not currently taking opioid prescriptions. Functional ability and status Nutritional status Physical activity Advanced directives List of other physicians Hospitalizations, surgeries, and ER visits in previous 12 months Vitals Screenings to include cognitive, depression, and falls Referrals and appointments  In addition, I have reviewed and discussed with patient certain preventive protocols, quality metrics, and best practice recommendations. A written personalized care plan for preventive services as well as general preventive health recommendations were provided to patient.     LoDionisio DavidLPN   10/02/07/6283 Nurse Notes: none

## 2021-11-11 ENCOUNTER — Other Ambulatory Visit: Payer: Self-pay | Admitting: Family Medicine

## 2021-11-11 DIAGNOSIS — E785 Hyperlipidemia, unspecified: Secondary | ICD-10-CM

## 2022-02-01 ENCOUNTER — Ambulatory Visit
Admission: RE | Admit: 2022-02-01 | Discharge: 2022-02-01 | Disposition: A | Discharge status: Home / Self Care | Payer: Medicare Other | Source: Ambulatory Visit | Attending: Family Medicine | Admitting: Family Medicine

## 2022-02-01 DIAGNOSIS — Z780 Asymptomatic menopausal state: Secondary | ICD-10-CM | POA: Insufficient documentation

## 2022-02-05 ENCOUNTER — Telehealth: Payer: Self-pay

## 2022-02-05 NOTE — Telephone Encounter (Signed)
Copied from [place] [id number]. Topic: Medical Record Request - Provider/Facility Request >> Feb 05, 2022  8:51 AM Everette C wrote: Reason for CRM: Corricia with [employer] [facility]  has called to follow up on a previously submitted records request originally faxed to the practice on 01/29/22  Please contact Corricia when possible to confirm receipt of the records request  Please fax requested information to [phone] Attn: [employer] [place] Medicare Member Care Gap   Please contact when possible

## 2022-02-10 NOTE — Patient Instructions (Signed)
Osteoporosis  Osteoporosis is when the bones get thin and weak. This can cause your bones to break (fracture) more easily. What are the causes? The exact cause of this condition is not known. What increases the risk? Having family members with this condition. Not eating enough healthy foods. Taking certain medicines. Being female. Being age 67 or older. Smoking or using other products that contain nicotine or tobacco, such as e-cigarettes or chewing tobacco. Not exercising. Being of European or Asian ancestry. Having a small body frame. What are the signs or symptoms? A broken bone might be the first sign, especially if the break results from a fall or injury that usually would not cause a bone to break. Other signs and symptoms include: Pain in the neck or low back. Being hunched over (stooped posture). Getting shorter. How is this treated? Eating more foods with more calcium and vitamin D in them. Doing exercises. Stopping tobacco use. Limiting how much alcohol you drink. Taking medicines to slow bone loss or help make the bones stronger. Taking supplements of calcium and vitamin D every day. Taking medicines to replace chemicals in the body (hormone replacement medicines). Monitoring your levels of calcium and vitamin D. The goal of treatment is to strengthen your bones and lower your risk for a bone break. Follow these instructions at home: Eating and drinking Eat plenty of calcium and vitamin D. These nutrients are good for your bones. Good sources of calcium and vitamin D include: Some fish, such as salmon and tuna. Foods that have calcium and vitamin D added to them (fortified foods), such as some breakfast cereals. Egg yolks. Cheese. Liver.  Activity Do exercises as told by your doctor. Ask your doctor what exercises are safe for you. You should do: Exercises that make your muscles work to hold your body weight up (weight-bearing exercises). These include tai chi,  yoga, and walking. Exercises to make your muscles stronger. One example is lifting weights. Lifestyle Do not drink alcohol if: Your doctor tells you not to drink. You are pregnant, may be pregnant, or are planning to become pregnant. If you drink alcohol: Limit how much you use to: 0-1 drink a day for women. 0-2 drinks a day for men. Know how much alcohol is in your drink. In the [place] one drink equals one 12 oz bottle of beer (355 mL), one 5 oz glass of wine (148 mL), or one 1 oz glass of hard liquor (44 mL). Do not smoke or use any products that contain nicotine or tobacco. If you need help quitting, ask your doctor. Preventing falls Use tools to help you move around (mobility aids) as needed. These include canes, walkers, scooters, and crutches. Keep rooms well-lit. Put away things on the floor that could make you trip. These include cords and rugs. Install safety rails on stairs. Install grab bars in bathrooms. Use rubber mats in slippery areas, like bathrooms. Wear shoes that: Fit you well. Support your feet. Have closed toes. Have rubber soles or low heels. Tell your doctor about all of the medicines you are taking. Some medicines can make you more likely to fall. General instructions Take over-the-counter and prescription medicines only as told by your doctor. Keep all follow-up visits. Contact a doctor if: You have not been tested (screened) for osteoporosis and you are: A woman who is age 74 or older. A man who is age 35 or older. Get help right away if: You fall. You get hurt. Summary Osteoporosis happens when your  bones get thin and weak. Weak bones can break (fracture) more easily. Eat plenty of calcium and vitamin D. These are good for your bones. Tell your doctor about all of the medicines that you take. This information is not intended to replace advice given to you by your health care provider. Make sure you discuss any questions you have with your health care  provider. Document Revised: 06/28/2019 Document Reviewed: 06/28/2019 Elsevier Patient Education  2023 Elsevier Inc.  

## 2022-02-10 NOTE — Progress Notes (Signed)
I,Shari Rivera,acting as a Education administrator for [employer], MD.,have documented all relevant documentation on the behalf of Shari Foster, MD,as directed by  Shari Foster, MD while in the presence of Shari Foster, MD.   Established patient visit   Patient: Shari Rivera   DOB: 1956-01-12   67 y.o. Female  MRN: [id number] Visit Date: 02/11/2022  Today's healthcare provider: [name], MD   No chief complaint on file.  Subjective    HPI  Osteoporosis: Patient complains of osteoporosis. She was diagnosed with osteoporosis by bone density scan in 02/01/22. The cause of osteoporosis is felt to be due to genetics.   She is currently being treated with calcium and vitamin D supplementation.  She is not currently being treated with bisphosphonates   She does not want to take injections  She reports that she has difficuulty with swallowing pills but would be willing to cut it in half to be able to swallow    She reports she was prescribed amldoipine for what what thought to be raynauds but the symptoms worsened on that medication she reports that three of her fingers will turn white and are painful and numb and get very cold    Dysphagia  She was recommended for evaluation for upper EGD and colonoscopy but due to issues with transportation was not able to present for these studies. She reports having diffculty swallaowing pills and often has to cut them in half and drink water before swallowing and right after swallowing medications  She states that she gets strangled with lying flat at night, sometimes after just taking a deep breath   Medications: Outpatient Medications Prior to Visit  Medication Sig   Calcium Carbonate-Vitamin D 600-400 MG-UNIT tablet Take 1 tablet by mouth daily.   famotidine (PEPCID) 40 MG tablet Take 1 tablet (40 mg total) by mouth at bedtime. (Patient not taking: Reported on 02/11/2022)   Multiple Vitamin  (MULTIVITAMIN WITH MINERALS) TABS tablet Take 1 tablet by mouth daily. (Patient not taking: Reported on 02/11/2022)   [DISCONTINUED] amLODipine (NORVASC) 2.5 MG tablet Take 2.5 mg by mouth daily. (Patient not taking: Reported on 02/11/2022)   [DISCONTINUED] cyclobenzaprine (FLEXERIL) 10 MG tablet Take by mouth. (Patient not taking: Reported on 02/11/2022)   [DISCONTINUED] cyclobenzaprine (FLEXERIL) 5 MG tablet Take 1 tablet (5 mg total) by mouth at bedtime. (Patient not taking: Reported on 02/11/2022)   [DISCONTINUED] gabapentin (NEURONTIN) 100 MG capsule Take 100 mg twice a day for one week, then increase to 200 mg(2 tablets) twice a day and continue (Patient not taking: Reported on 02/11/2022)   [DISCONTINUED] ibuprofen (ADVIL) 200 MG tablet Take by mouth. (Patient not taking: Reported on 02/11/2022)   [DISCONTINUED] Ibuprofen-diphenhydrAMINE Cit (IBUPROFEN PM PO) Take by mouth. (Patient not taking: Reported on 02/11/2022)   [DISCONTINUED] omeprazole (PRILOSEC) 10 MG capsule Take by mouth. (Patient not taking: Reported on 02/11/2022)   [DISCONTINUED] omeprazole (PRILOSEC) 20 MG capsule Take 20 mg by mouth daily. (Patient not taking: Reported on 02/11/2022)   [DISCONTINUED] rosuvastatin (CRESTOR) 20 MG tablet Take 1 tablet (20 mg total) by mouth daily. (Patient not taking: Reported on 02/11/2022)   [DISCONTINUED] sertraline (ZOLOFT) 100 MG tablet Take 1 tablet (100 mg total) by mouth daily. (Patient not taking: Reported on 02/11/2022)   No facility-administered medications prior to visit.    Review of Systems      Objective    BP 111/61 (BP Location: Left Arm, Patient Position: Sitting, Cuff Size: Normal)   Pulse  82   Temp 98.2 F (36.8 C) (Oral)   Wt 118 lb (53.5 kg)   SpO2 100%   BMI 20.90 kg/m     Physical Exam Vitals reviewed.  Constitutional:      General: She is not in acute distress.    Appearance: Normal appearance. She is not ill-appearing, toxic-appearing or diaphoretic.  HENT:      Mouth/Throat:     Mouth: Mucous membranes are dry.     Dentition: Has dentures.  Eyes:     Conjunctiva/sclera: Conjunctivae normal.  Cardiovascular:     Rate and Rhythm: Normal rate and regular rhythm.     Pulses: Normal pulses.     Heart sounds: Normal heart sounds. No murmur heard.    No friction rub. No gallop.  Pulmonary:     Effort: Pulmonary effort is normal. No respiratory distress.     Breath sounds: Normal breath sounds. No stridor. No wheezing, rhonchi or rales.  Abdominal:     General: Bowel sounds are normal. There is no distension.     Palpations: Abdomen is soft.     Tenderness: There is no abdominal tenderness.  Musculoskeletal:     Right lower leg: No edema.     Left lower leg: No edema.  Skin:    Findings: No erythema or rash.  Neurological:     Mental Status: She is alert and oriented to person, place, and time.      Results for orders placed or performed in visit on 02/11/22  Comprehensive metabolic panel  Result Value Ref Range   Glucose 101 (H) 70 - 99 mg/dL   BUN 13 8 - 27 mg/dL   Creatinine, Ser 0.91 0.57 - 1.00 mg/dL   eGFR 69 >59 mL/min/1.73   BUN/Creatinine Ratio 14 12 - 28   Sodium 141 134 - 144 mmol/L   Potassium 5.0 3.5 - 5.2 mmol/L   Chloride 103 96 - 106 mmol/L   CO2 25 20 - 29 mmol/L   Calcium 10.3 8.7 - 10.3 mg/dL   Total Protein 6.8 6.0 - 8.5 g/dL   Albumin 4.1 3.9 - 4.9 g/dL   Globulin, Total 2.7 1.5 - 4.5 g/dL   Albumin/Globulin Ratio 1.5 1.2 - 2.2   Bilirubin Total 0.2 0.0 - 1.2 mg/dL   Alkaline Phosphatase 90 44 - 121 IU/L   AST 17 0 - 40 IU/L   ALT 13 0 - 32 IU/L  Parathyroid hormone, intact (no Ca)  Result Value Ref Range   PTH 39 15 - 65 pg/mL    Assessment & Plan     Problem List Items Addressed This Visit       Digestive   Esophageal dysphagia    Resubmitted referral to gastroenterology for dysphagia evaluation       Relevant Orders   Ambulatory referral to Gastroenterology     Musculoskeletal and Integument    Age-related osteoporosis without current pathological fracture - Primary    DEXA scan with T score -3.1 Prescribed fosamax '70mg'$  once weekly  Repeat DEXA in two years  Discussed recommendations for calcium and vitamin D supplementation as well as increased dietary intake  Also recommended weight bearing exercise to help with bone strength         Relevant Medications   alendronate (FOSAMAX) 70 MG tablet   Other Relevant Orders   Comprehensive metabolic panel (Completed)   Parathyroid hormone, intact (no Ca) (Completed)     Other   Recurrent major depressive disorder, in partial  remission ([place])    Chronic Marks positively for SI today on PHQ 9  Denies plan but states that swallowing difficulties, problems with dentition, finances and family dynamics all contribute to depression symptoms  Refills provided for Zoloft '100mg'$  daily       Relevant Medications   sertraline (ZOLOFT) 100 MG tablet   Hyperlipidemia    Chronic  Refilled statin prescription for rosuvastatin '20mg'$  daily  Adherence is low due to patient has run out of medication      Relevant Medications   rosuvastatin (CRESTOR) 20 MG tablet     Return in about 3 weeks (around 03/04/2022) for osteoporosis med, swallowing f/u .       The entirety of the information documented in the History of Present Illness, Review of Systems and Physical Exam were personally obtained by me. Portions of this information were initially documented by Eduard Clos, CMA and reviewed by me for thoroughness and accuracy.Shari Foster, MD     Shari Foster, MD  [facility] [phone] (phone) [phone] (fax)  [place]

## 2022-02-11 ENCOUNTER — Ambulatory Visit (INDEPENDENT_AMBULATORY_CARE_PROVIDER_SITE_OTHER): Payer: Medicare Other | Admitting: Family Medicine

## 2022-02-11 ENCOUNTER — Encounter: Payer: Self-pay | Admitting: Family Medicine

## 2022-02-11 VITALS — BP 111/61 | HR 82 | Temp 98.2°F | Wt 118.0 lb

## 2022-02-11 DIAGNOSIS — M810 Age-related osteoporosis without current pathological fracture: Secondary | ICD-10-CM | POA: Insufficient documentation

## 2022-02-11 DIAGNOSIS — F3341 Major depressive disorder, recurrent, in partial remission: Secondary | ICD-10-CM

## 2022-02-11 DIAGNOSIS — E785 Hyperlipidemia, unspecified: Secondary | ICD-10-CM | POA: Diagnosis not present

## 2022-02-11 DIAGNOSIS — R1319 Other dysphagia: Secondary | ICD-10-CM

## 2022-02-11 MED ORDER — GABAPENTIN 100 MG PO CAPS: ORAL_CAPSULE | ORAL | 1 refills | Status: DC

## 2022-02-11 MED ORDER — ROSUVASTATIN CALCIUM 20 MG PO TABS: 20.0000 mg | ORAL_TABLET | Freq: Every day | ORAL | 1 refills | Status: DC

## 2022-02-11 MED ORDER — SERTRALINE HCL 100 MG PO TABS: 100.0000 mg | ORAL_TABLET | Freq: Every day | ORAL | 1 refills | Status: DC

## 2022-02-11 MED ORDER — ALENDRONATE SODIUM 70 MG PO TABS: 70.0000 mg | ORAL_TABLET | ORAL | 1 refills | Status: DC

## 2022-02-12 ENCOUNTER — Encounter: Payer: Self-pay | Admitting: Family Medicine

## 2022-02-12 DIAGNOSIS — R1319 Other dysphagia: Secondary | ICD-10-CM | POA: Insufficient documentation

## 2022-02-12 DIAGNOSIS — F339 Major depressive disorder, recurrent, unspecified: Secondary | ICD-10-CM | POA: Insufficient documentation

## 2022-02-12 DIAGNOSIS — E785 Hyperlipidemia, unspecified: Secondary | ICD-10-CM | POA: Insufficient documentation

## 2022-02-12 DIAGNOSIS — F3341 Major depressive disorder, recurrent, in partial remission: Secondary | ICD-10-CM | POA: Insufficient documentation

## 2022-02-12 LAB — COMPREHENSIVE METABOLIC PANEL
ALT: 13 IU/L (ref 0–32)
AST: 17 IU/L (ref 0–40)
Albumin/Globulin Ratio: 1.5 (ref 1.2–2.2)
Albumin: 4.1 g/dL (ref 3.9–4.9)
Alkaline Phosphatase: 90 IU/L (ref 44–121)
BUN/Creatinine Ratio: 14 (ref 12–28)
BUN: 13 mg/dL (ref 8–27)
Bilirubin Total: 0.2 mg/dL (ref 0.0–1.2)
CO2: 25 mmol/L (ref 20–29)
Calcium: 10.3 mg/dL (ref 8.7–10.3)
Chloride: 103 mmol/L (ref 96–106)
Creatinine, Ser: 0.91 mg/dL (ref 0.57–1.00)
Globulin, Total: 2.7 g/dL (ref 1.5–4.5)
Glucose: 101 mg/dL — ABNORMAL HIGH (ref 70–99)
Potassium: 5 mmol/L (ref 3.5–5.2)
Sodium: 141 mmol/L (ref 134–144)
Total Protein: 6.8 g/dL (ref 6.0–8.5)
eGFR: 69 mL/min/{1.73_m2} (ref >59)

## 2022-02-12 LAB — PARATHYROID HORMONE, INTACT (NO CA): PTH: 39 pg/mL (ref 15–65)

## 2022-02-12 NOTE — Assessment & Plan Note (Signed)
Chronic  Refilled statin prescription for rosuvastatin '20mg'$  daily  Adherence is low due to patient has run out of medication

## 2022-02-12 NOTE — Assessment & Plan Note (Signed)
Resubmitted referral to gastroenterology for dysphagia evaluation

## 2022-02-12 NOTE — Assessment & Plan Note (Addendum)
DEXA scan with T score -3.1 Prescribed fosamax '70mg'$  once weekly  Repeat DEXA in two years  Discussed recommendations for calcium and vitamin D supplementation as well as increased dietary intake  Also recommended weight bearing exercise to help with bone strength

## 2022-02-12 NOTE — Assessment & Plan Note (Signed)
Chronic Marks positively for SI today on PHQ 9  Denies plan but states that swallowing difficulties, problems with dentition, finances and family dynamics all contribute to depression symptoms  Refills provided for Zoloft '100mg'$  daily

## 2022-03-10 ENCOUNTER — Ambulatory Visit (INDEPENDENT_AMBULATORY_CARE_PROVIDER_SITE_OTHER): Payer: Medicare Other | Admitting: Family Medicine

## 2022-03-10 ENCOUNTER — Encounter: Payer: Self-pay | Admitting: Family Medicine

## 2022-03-10 VITALS — BP 123/80 | HR 74 | Resp 16 | Ht 62.25 in | Wt 121.7 lb

## 2022-03-10 DIAGNOSIS — R1319 Other dysphagia: Secondary | ICD-10-CM | POA: Diagnosis not present

## 2022-03-10 DIAGNOSIS — M810 Age-related osteoporosis without current pathological fracture: Secondary | ICD-10-CM

## 2022-03-10 DIAGNOSIS — Z23 Encounter for immunization: Secondary | ICD-10-CM | POA: Diagnosis not present

## 2022-03-10 NOTE — Assessment & Plan Note (Signed)
Chronic  Stable  Referral submitted previously  Patient missed call and erased message with contact information  Patient given phone number for [facility] as reviewed in referral tab  Patient to contact office for scheduling  Will follow up in 3 months

## 2022-03-10 NOTE — Patient Instructions (Addendum)
[facility]  [place], [place], [place] [address] Hours:  Closes soon ? 5?PM ? Opens 8?AM Thu Phone: [phone]  Please call to schedule an appointment to follow up on your referral    Make sure to ask about colonoscopy at this appt

## 2022-03-10 NOTE — Progress Notes (Signed)
Established patient visit   Patient: Shari Rivera   DOB: 1955/05/30   67 y.o. Female  MRN: [id number] Visit Date: 03/10/2022  Today's healthcare provider: [name], MD   Chief Complaint  Patient presents with   Follow-up Osteoporosis   Subjective    HPI  Patient comes in to discuss treatment for Osteoporosis. 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 Take with a full glass of water on an empty stomach.   Calcium Carbonate-Vitamin D 600-400 MG-UNIT tablet Take 1 tablet by mouth daily.   Coenzyme Q10 (COQ-10) 100 MG capsule Take 100 mg by mouth daily.   famotidine (PEPCID) 40 MG tablet Take 1 tablet (40 mg total) by mouth at bedtime.   gabapentin (NEURONTIN) 100 MG capsule Take 100 mg twice a day for one week, then increase to 200 mg(2 tablets) twice a day and continue   Multiple Vitamin (MULTIVITAMIN WITH MINERALS) TABS tablet Take 1 tablet by mouth daily.   rosuvastatin (CRESTOR) 20 MG tablet Take 1 tablet (20 mg total) by mouth daily.   sertraline (ZOLOFT) 100 MG tablet Take 1 tablet (100 mg total) by mouth daily.   No facility-administered medications prior to visit.    Review of Systems     Objective    BP 123/80 (BP Location: Left Arm, Patient Position: Sitting, Cuff Size: Normal)   Pulse 74   Resp 16   Ht 5' 2.25" (1.581 m)   Wt 121 lb 11.2 oz (55.2 kg)   BMI 22.08 kg/m    Physical Exam Vitals reviewed.  Constitutional:      General: She is not in acute distress.    Appearance: She is not ill-appearing, toxic-appearing or diaphoretic.     Comments: Thin appearing  female in NAD sitting in exam room chair   Eyes:     Conjunctiva/sclera: Conjunctivae normal.  Cardiovascular:     Rate and Rhythm: Normal rate and regular rhythm.     Pulses: Normal pulses.     Heart sounds: Normal heart sounds. No murmur heard.    No friction rub. No gallop.  Pulmonary:     Effort: Pulmonary effort is normal. No respiratory distress.     Breath sounds: Normal breath sounds. No stridor. No wheezing, rhonchi or rales.  Abdominal:     General: Bowel sounds are normal. There is no distension.     Palpations: Abdomen is soft.     Tenderness: There is no abdominal tenderness.  Musculoskeletal:     Right lower leg: No edema.     Left lower leg: No edema.  Skin:    Findings: No erythema or rash.  Neurological:     Mental Status: She is alert and oriented to person, place, and time.      No results found for any visits on 03/10/22.  Assessment & Plan     Problem List Items Addressed This Visit       Digestive   Esophageal dysphagia    Chronic  Stable  Referral submitted previously  Patient missed call and erased message with contact information  Patient given  phone number for [facility] as reviewed in referral tab  Patient to contact office for scheduling  Will follow up in 3 months         Musculoskeletal and Integument   Age-related osteoporosis without current pathological fracture - Primary    Chronic, stable No fractures, doing well and tolerating fosamax 55m daily  She will continue current medication as well as vitamin D and calcium supplementation  Plan to repeat DEXA scan in 2026         Other   Need for COVID-19 vaccine   Relevant Orders   Pfizer Fall 2023 Covid-19 Vaccine 190yrand older (Completed)     Return in about 3 months (around 06/08/2022) for chronic conditions.        The entirety of the information documented in the History of Present Illness, Review of Systems and Physical Exam were personally obtained by me. Portions  of this information were initially documented by JoLyndel PleasureCMA and reviewed by me for thoroughness and accuracy.MaEulis FosterMD     MaEulis FosterMD  [phone]) [phone])  [place]

## 2022-03-10 NOTE — Assessment & Plan Note (Signed)
Chronic, stable No fractures, doing well and tolerating fosamax 30m daily  She will continue current medication as well as vitamin D and calcium supplementation  Plan to repeat DEXA scan in 2026

## 2022-03-30 ENCOUNTER — Other Ambulatory Visit: Payer: Self-pay | Admitting: Family Medicine

## 2022-03-30 ENCOUNTER — Ambulatory Visit (INDEPENDENT_AMBULATORY_CARE_PROVIDER_SITE_OTHER): Payer: Medicare Other | Admitting: Gastroenterology

## 2022-03-30 ENCOUNTER — Other Ambulatory Visit: Payer: Self-pay

## 2022-03-30 ENCOUNTER — Encounter: Payer: Self-pay | Admitting: Gastroenterology

## 2022-03-30 VITALS — BP 111/71 | HR 72 | Temp 98.1°F | Ht 62.25 in | Wt 124.5 lb

## 2022-03-30 DIAGNOSIS — K219 Gastro-esophageal reflux disease without esophagitis: Secondary | ICD-10-CM | POA: Diagnosis not present

## 2022-03-30 DIAGNOSIS — Z1211 Encounter for screening for malignant neoplasm of colon: Secondary | ICD-10-CM

## 2022-03-30 DIAGNOSIS — Z1231 Encounter for screening mammogram for malignant neoplasm of breast: Secondary | ICD-10-CM

## 2022-03-30 MED ORDER — PEG 3350-KCL-NA BICARB-NACL 420 G PO SOLR: 4000.0000 mL | Freq: Once | ORAL | 0 refills | Status: AC

## 2022-03-30 NOTE — Progress Notes (Signed)
Cephas Darby, MD [address]  [place]  [place], [place] [address]  Main: [phone]  Fax: [phone]    Gastroenterology Consultation  Referring Provider:     Donnal Moat* Primary Care Physician:  [name], MD Primary Gastroenterologist:  Dr. Cephas Darby Reason for Consultation: Chronic GERD, colon cancer screening        HPI:   Shari Rivera is a 67 y.o. female referred by Dr. [name], MD  for consultation & management of chronic GERD.  Patient reports having several years history of acid reflux, burning in her chest.  She was taking Tagamet for several years and stopped because she was told that long-term acid suppression is not good for her health.  Recently she was taking omeprazole several doses daily, was switched to Pepcid 40 mg daily by Dr. Rosanna Randy and she feels Pepcid has worked the best.  She admits to drinking 5 cups of coffee with sugar, and different types of herbal tea in the evenings.  She has been experiencing nocturnal heartburn, regurgitation that wakes her up from sleep, associated with choking spells. She denies having any change in bowel habits, denies any rectal bleeding.  Her weight has been stable.  NSAIDs: None  Antiplts/Anticoagulants/Anti thrombotics: None  GI Procedures: Reports undergoing upper endoscopy and colonoscopy more than 15 years ago  Past Medical History:  Diagnosis Date   Allergy    Anemia    Anxiety    Arthritis    Cancer ([place])    Cataract    Depression    Endometriosis     Past Surgical History:  Procedure Laterality Date   APPENDECTOMY     NASAL SEPTUM SURGERY     SMALL INTESTINE SURGERY       Current Outpatient Medications:    alendronate (FOSAMAX) 70 MG tablet, Take 1 tablet (70 mg total) by mouth once a week. Take with a full glass of water on an empty stomach., Disp: 12 tablet, Rfl: 1   Calcium Carbonate-Vitamin D 600-400 MG-UNIT tablet, Take 1 tablet by  mouth daily., Disp: , Rfl:    Coenzyme Q10 (COQ-10) 100 MG capsule, Take 100 mg by mouth daily., Disp: , Rfl:    famotidine (PEPCID) 40 MG tablet, Take 1 tablet (40 mg total) by mouth at bedtime., Disp: 30 tablet, Rfl: 11   gabapentin (NEURONTIN) 100 MG capsule, Take 100 mg twice a day for one week, then increase to 200 mg(2 tablets) twice a day and continue, Disp: 120 capsule, Rfl: 1   Multiple Vitamin (MULTIVITAMIN WITH MINERALS) TABS tablet, Take 1 tablet by mouth daily., Disp: , Rfl:    rosuvastatin (CRESTOR) 20 MG tablet, Take 1 tablet (20 mg total) by mouth daily., Disp: 90 tablet, Rfl: 1   sertraline (ZOLOFT) 100 MG tablet, Take 1 tablet (100 mg total) by mouth daily., Disp: 90 tablet, Rfl: 1   Family History  Problem Relation Age of Onset   Cancer Mother    Stroke Mother    COPD Mother    Heart failure Mother    Atrial fibrillation Mother    Heart disease Father    Cancer Maternal Grandmother    Diabetes Maternal Grandmother    Kidney disease Maternal Grandfather    Heart disease Maternal Grandfather    Heart disease Paternal Grandfather    Breast cancer Other        mggm     Social History   Tobacco Use   Smoking status: Former  Smokeless tobacco: Never  Vaping Use   Vaping Use: Former  Substance Use Topics   Alcohol use: No   Drug use: No    Allergies as of 03/30/2022 - Review Complete 03/10/2022  Allergen Reaction Noted   Codeine Diarrhea and Nausea And Vomiting 05/27/2015    Review of Systems:    All systems reviewed and negative except where noted in HPI.   Physical Exam:  BP 111/71 (BP Location: Left Arm, Patient Position: Sitting, Cuff Size: Normal)   Pulse 72   Temp 98.1 F (36.7 C) (Oral)   Ht 5' 2.25" (1.581 m)   Wt 124 lb 8 oz (56.5 kg)   BMI 22.59 kg/m  No LMP recorded. Patient is postmenopausal.  General:   Alert,  Well-developed, well-nourished, pleasant and cooperative in NAD Head:  Normocephalic and atraumatic. Eyes:  Sclera clear,  no icterus.   Conjunctiva pink. Ears:  Normal auditory acuity. Nose:  No deformity, discharge, or lesions. Mouth:  No deformity or lesions,oropharynx pink & moist. Neck:  Supple; no masses or thyromegaly. Lungs:  Respirations even and unlabored.  Clear throughout to auscultation.   No wheezes, crackles, or rhonchi. No acute distress. Heart:  Regular rate and rhythm; no murmurs, clicks, rubs, or gallops. Abdomen:  Normal bowel sounds. Soft, non-tender and non-distended without masses, hepatosplenomegaly or hernias noted.  No guarding or rebound tenderness.   Rectal: Not performed Msk:  Symmetrical without gross deformities. Good, equal movement & strength bilaterally. Pulses:  Normal pulses noted. Extremities:  No clubbing or edema.  No cyanosis. Neurologic:  Alert and oriented x3;  grossly normal neurologically. Skin:  Intact without significant lesions or rashes. No jaundice. Psych:  Alert and cooperative. Normal mood and affect.  Imaging Studies: No abdominal imaging  Assessment and Plan:   Shari Rivera is a 67 y.o. female with osteoporosis, chronic GERD is seen in consultation for recent flareup of GERD symptoms and sensation of something stuck in her throat, colon cancer screening  Chronic GERD with recent flareup With history of osteoporosis, continue Pepcid 40 mg daily instead of PPI Recommend EGD for further evaluation Strongly advised patient to significantly cut back on caffeine intake, she said she will work on it and try to drink morning coffee only  Colon cancer screening Schedule screening colonoscopy  I have discussed alternative options, risks & benefits,  which include, but are not limited to, bleeding, infection, perforation,respiratory complication & drug reaction.  The patient agrees with this plan & written consent will be obtained.     Follow up based on the above workup   Cephas Darby, MD

## 2022-04-19 ENCOUNTER — Telehealth: Payer: Self-pay | Admitting: Gastroenterology

## 2022-04-19 ENCOUNTER — Telehealth: Payer: Self-pay | Admitting: Family Medicine

## 2022-04-19 NOTE — Telephone Encounter (Signed)
Patient is requesting Suprep for her colonoscopy on Wednesday. Patient states she is unable to take what she was given. Please advise patient if this can be done. [facility] [id number], [place] AT [address] Phone: [phone]  Fax: [phone]

## 2022-04-19 NOTE — Telephone Encounter (Signed)
Patient calling needing to reschedule colonoscopy because she has nobody to bring her. Requesting call back.

## 2022-04-19 NOTE — Telephone Encounter (Signed)
Pt returned call and was given the message to contact the doctor doing the colonoscopy to request the prep she wanted to use.     She mentioned she is needing to cancel the colonoscopy endoscopy because the person that was going to  take her had a family emergency and she doesn't have anyone else that can take her so she's going to reschedule.   I let her know that can work with her on rescheduling that should not be a problem.

## 2022-04-20 NOTE — Telephone Encounter (Signed)
Called and left a message for call back  

## 2022-04-20 NOTE — Telephone Encounter (Signed)
Informed Shari Rivera of the cancellation

## 2022-04-21 ENCOUNTER — Ambulatory Visit: Admission: RE | Admit: 2022-04-21 | Payer: Medicare Other | Source: Home / Self Care | Admitting: Gastroenterology

## 2022-04-21 ENCOUNTER — Encounter: Admission: RE | Payer: Self-pay | Source: Home / Self Care

## 2022-04-21 SURGERY — COLONOSCOPY WITH PROPOFOL
Anesthesia: General

## 2022-04-21 NOTE — Telephone Encounter (Signed)
Called and left a message for call back  

## 2022-04-22 ENCOUNTER — Ambulatory Visit
Admission: RE | Admit: 2022-04-22 | Discharge: 2022-04-22 | Disposition: A | Discharge status: Home / Self Care | Payer: Medicare Other | Source: Ambulatory Visit | Attending: Family Medicine | Admitting: Family Medicine

## 2022-04-22 DIAGNOSIS — Z1231 Encounter for screening mammogram for malignant neoplasm of breast: Secondary | ICD-10-CM | POA: Insufficient documentation

## 2022-04-26 ENCOUNTER — Other Ambulatory Visit: Payer: Self-pay | Admitting: Family Medicine

## 2022-05-15 ENCOUNTER — Other Ambulatory Visit: Payer: Self-pay | Admitting: Family Medicine

## 2022-05-15 DIAGNOSIS — F3341 Major depressive disorder, recurrent, in partial remission: Secondary | ICD-10-CM

## 2022-05-24 ENCOUNTER — Telehealth: Payer: Self-pay | Admitting: Family Medicine

## 2022-06-08 ENCOUNTER — Ambulatory Visit: Payer: Medicare Other | Admitting: Family Medicine

## 2022-06-10 ENCOUNTER — Ambulatory Visit (INDEPENDENT_AMBULATORY_CARE_PROVIDER_SITE_OTHER): Payer: Medicare Other | Admitting: Family Medicine

## 2022-06-10 ENCOUNTER — Encounter: Payer: Self-pay | Admitting: Family Medicine

## 2022-06-10 VITALS — BP 114/72 | HR 67 | Resp 16 | Ht 62.25 in | Wt 123.3 lb

## 2022-06-10 DIAGNOSIS — R269 Unspecified abnormalities of gait and mobility: Secondary | ICD-10-CM

## 2022-06-10 DIAGNOSIS — H9201 Otalgia, right ear: Secondary | ICD-10-CM | POA: Insufficient documentation

## 2022-06-10 DIAGNOSIS — M542 Cervicalgia: Secondary | ICD-10-CM

## 2022-06-10 DIAGNOSIS — R52 Pain, unspecified: Secondary | ICD-10-CM

## 2022-06-10 DIAGNOSIS — R296 Repeated falls: Secondary | ICD-10-CM

## 2022-06-10 DIAGNOSIS — N3945 Continuous leakage: Secondary | ICD-10-CM | POA: Diagnosis not present

## 2022-06-10 DIAGNOSIS — F3341 Major depressive disorder, recurrent, in partial remission: Secondary | ICD-10-CM

## 2022-06-10 DIAGNOSIS — H9193 Unspecified hearing loss, bilateral: Secondary | ICD-10-CM

## 2022-06-10 MED ORDER — FLUTICASONE PROPIONATE 50 MCG/ACT NA SUSP: 2.0000 | Freq: Every day | NASAL | 6 refills | Status: AC

## 2022-06-10 MED ORDER — BREXPIPRAZOLE 0.25 MG PO TABS: 0.2500 mg | ORAL_TABLET | Freq: Every day | ORAL | 0 refills | Status: DC

## 2022-06-10 NOTE — Patient Instructions (Signed)
It was a pleasure to see you today!  Thank you for choosing [facility] for your primary care.   Shari Rivera was seen for chronic conditions.   Our plans for today were: For your ear, I have referred you to Ear Nose and Throat for the pain and hearing loss  I have submitted a chronic care referral to help with managing conditions, medications and connecting you with resources for your depression and memory changes  We will collect labs today to look for levels that can impact your memory and balance. We will follow up with results of labs once they are available.  I have placed a referral to neurosurgery for your neck pain and bladder concerns  Please call us if you do not hear from these referrals in the next two weeks  I have provided samples of Rexulti to help with your depressed mood. Please take the 0.5mg  and 1mg  tablets as directed on the box. It is a 1 month supply (each box is for two weeks)  Please call the gastroenterology office to reschedule your appointment    You should return to our clinic in 3 weeks for all we discussed today.   Best Wishes,   Dr. Roxan Hockey

## 2022-06-10 NOTE — Progress Notes (Signed)
Established patient visit   Patient: Shari Rivera   DOB: [birth year]   67 y.o. Female  MRN: [id number] Visit Date: 06/10/2022  Today's healthcare provider: Ronnald Ramp, MD   Chief Complaint  Patient presents with   Follow-up   Depression   Neck Pain   Fall   Urinary Incontinence   Subjective    HPI  Chronic GERD and Dysphagia  Patient here to follow-up on esophageal dysphagia. Per patient symptoms are worsening. She has not been able to follow up with gastroenterology for endoscopy  As of yet due to transportation limits  States that she plans to go by the GI office to get rescheduled as her transportation support is able to take her now   Neck Pain and Stiffness  Patient states she has neck pain that makes the right side of her neck feel stiff, she reports that it is grinding when she tries to move her head, she reports that she previously saw a neurologist and she was prescribed muscle relaxers and gabapentin  States she was not able to participate in PT because her of work schedule and would need to work only after 4PM if she were to get established   Right Ear pain  Reports right ear pain has been worsening over the last two weeks  She states that her throat has been hurting as well  She has hearing loss at baseline   Depression  Reports she does not have interest to do anything and she has a hard time sleeping  She is mostly sleeping from 4-6A most nights  She mostly sleeps on her days off  She denies wanting to harm herself or end her life, states that she would never do that given her religious beliefs  States that the Zoloft does not help her symptoms  Memory concerns in short term  Will f Incontinence  Chronic and worsening  Reports she can't tell she is urinating at times  She has to wear poise pads and extra jeans  She reports saturating pads   Goes through 66 pads per week   Medications: Outpatient Medications Prior to Visit   Medication Sig   alendronate (FOSAMAX) 70 MG tablet Take 1 tablet (70 mg total) by mouth once a week. Take with a full glass of water on an empty stomach.   Calcium Carbonate-Vitamin D 600-400 MG-UNIT tablet Take 1 tablet by mouth daily.   Coenzyme Q10 (COQ-10) 100 MG capsule Take 100 mg by mouth daily.   famotidine (PEPCID) 40 MG tablet Take 1 tablet (40 mg total) by mouth at bedtime.   gabapentin (NEURONTIN) 100 MG capsule TAKE 2 CAPSULES TWICE DAILY   Multiple Vitamin (MULTIVITAMIN WITH MINERALS) TABS tablet Take 1 tablet by mouth daily.   rosuvastatin (CRESTOR) 20 MG tablet Take 1 tablet (20 mg total) by mouth daily.   sertraline (ZOLOFT) 100 MG tablet TAKE ONE TABLET BY MOUTH EVERY DAY   No facility-administered medications prior to visit.    Review of Systems  Constitutional:  Positive for fatigue. Negative for fever.  HENT:  Positive for ear pain, hearing loss and trouble swallowing. Negative for ear discharge and sore throat.   Genitourinary:        Incontinence  Musculoskeletal:  Positive for gait problem, neck pain and neck stiffness.  Psychiatric/Behavioral:         Loss of interest, hypersomnolence       Objective    BP 114/72 (BP Location: Left Arm, Patient  Position: Sitting, Cuff Size: Normal)   Pulse 67   Resp 16   Ht 5' 2.25" (1.581 m)   Wt 123 lb 4.8 oz (55.9 kg)   BMI 22.37 kg/m    Physical Exam Constitutional:      General: She is not in acute distress.    Appearance: She is not ill-appearing, toxic-appearing or diaphoretic.     Comments: Thin appearing, elderly female   HENT:     Head: Normocephalic and atraumatic.  Cardiovascular:     Rate and Rhythm: Normal rate and regular rhythm.     Pulses: Normal pulses.     Heart sounds: Normal heart sounds. No murmur heard. Pulmonary:     Effort: Pulmonary effort is normal. No respiratory distress.     Breath sounds: Normal breath sounds. No wheezing, rhonchi or rales.  Abdominal:     General: Bowel  sounds are normal. There is no distension.     Palpations: Abdomen is soft. There is no mass.     Tenderness: There is no abdominal tenderness.  Musculoskeletal:        General: Tenderness present. No signs of injury.     Cervical back: Rigidity and tenderness present.     Right lower leg: No edema.     Left lower leg: No edema.  Lymphadenopathy:     Cervical: No cervical adenopathy.  Neurological:     Mental Status: She is alert and oriented to person, place, and time.     Cranial Nerves: No cranial nerve deficit.     Motor: No weakness.     Gait: Gait abnormal.  Psychiatric:        Attention and Perception: Attention normal.        Mood and Affect: Affect normal. Mood is depressed.        Speech: Speech normal.        Behavior: Behavior normal. Behavior is cooperative.        Thought Content: Thought content does not include homicidal or suicidal ideation. Thought content does not include homicidal or suicidal plan.       Results for orders placed or performed in visit on 06/10/22  TSH + free T4  Result Value Ref Range   TSH 3.270 0.450 - 4.500 uIU/mL   Free T4 0.78 (L) 0.82 - 1.77 ng/dL  CBC  Result Value Ref Range   WBC 6.2 3.4 - 10.8 x10E3/uL   RBC 3.76 (L) 3.77 - 5.28 x10E6/uL   Hemoglobin 11.9 11.1 - 15.9 g/dL   Hematocrit 82.9 56.2 - 46.6 %   MCV 96 79 - 97 fL   MCH 31.6 26.6 - 33.0 pg   MCHC 33.1 31.5 - 35.7 g/dL   RDW 13.0 86.5 - 78.4 %   Platelets 202 150 - 450 x10E3/uL  Comprehensive metabolic panel  Result Value Ref Range   Glucose 90 70 - 99 mg/dL   BUN 13 8 - 27 mg/dL   Creatinine, Ser 6.96 0.57 - 1.00 mg/dL   eGFR 64 >29 BM/WUX/3.24   BUN/Creatinine Ratio 13 12 - 28   Sodium 139 134 - 144 mmol/L   Potassium 4.6 3.5 - 5.2 mmol/L   Chloride 105 96 - 106 mmol/L   CO2 22 20 - 29 mmol/L   Calcium 9.4 8.7 - 10.3 mg/dL   Total Protein 6.2 6.0 - 8.5 g/dL   Albumin 3.9 3.9 - 4.9 g/dL   Globulin, Total 2.3 1.5 - 4.5 g/dL   Albumin/Globulin Ratio 1.7 1.2 -  2.2   Bilirubin Total <0.2 0.0 - 1.2 mg/dL   Alkaline Phosphatase 96 44 - 121 IU/L   AST 26 0 - 40 IU/L   ALT 27 0 - 32 IU/L  B12 and Folate Panel  Result Value Ref Range   Vitamin B-12 982 232 - 1,245 pg/mL   Folate >20.0 >3.0 ng/mL  Hemoglobin A1c  Result Value Ref Range   Hgb A1c MFr Bld 5.7 (H) 4.8 - 5.6 %   Est. average glucose Bld gHb Est-mCnc 117 mg/dL  RPR  Result Value Ref Range   RPR Ser Ql Non Reactive Non Reactive  HIV antibody (with reflex)  Result Value Ref Range   HIV Screen 4th Generation wRfx Non Reactive Non Reactive    Assessment & Plan     Problem List Items Addressed This Visit       Nervous and Auditory   Acute otalgia, right    Acute right ear pain  Recommended intranasal steroids, 2 sprays twice daily of Flonase  No signs of AOM  Has hx of nasal surgery and hearing loss, recommended evaluation by ENT, referral submitted today       Relevant Orders   Ambulatory referral to ENT   Hearing loss associated with syndrome of both ears    Chronic  ENT referral submitted       Relevant Orders   Ambulatory referral to ENT     Other   Recurrent major depressive disorder, in partial remission (HCC)    Chronic  Severe Patient reports anhedonia and sleep challenges (either too little or too much)  Recommended trial of Rexulti 0.5mg  and 1mg , samples given today for 1 month Continue Zoloft 100mg  daily   CCM referral placed for assistance with depression, reports short term memory changes        Relevant Orders   AMB Referral to Chronic Care Management Services   Falls frequently - Primary    Counseling on removing potential tripping hazards in the home  Recommended PT, patient states this would feasible if she can have late appointments       Relevant Orders   AMB Referral to Chronic Care Management Services   TSH + free T4 (Completed)   CBC (Completed)   Comprehensive metabolic panel (Completed)   B12 and Folate Panel (Completed)    Hemoglobin A1c (Completed)   RPR (Completed)   HIV antibody (with reflex) (Completed)   Ambulatory referral to Physical Therapy   Neck pain    Chronic  Worsening  Reports grinding pain when rotating neck from side to side  Reports hx of restricted neck movement  Referral to neurosurgery and PT       Relevant Orders   Ambulatory referral to Neurosurgery   Ambulatory referral to Physical Therapy   Abnormal gait    Chronic  Reports veering to the right when walking and sitting  Denies vertigo symptoms  Does have hx of hearing loss  Recommended ENT evaluation for hearing loss  Collected HIV screening, CMP, CBC, vitamin B12, folate, TSH and free T4 as well as A1c and RPR       Relevant Orders   AMB Referral to Chronic Care Management Services   TSH + free T4 (Completed)   CBC (Completed)   Comprehensive metabolic panel (Completed)   B12 and Folate Panel (Completed)   RPR (Completed)   HIV antibody (with reflex) (Completed)   Ambulatory referral to Physical Therapy   Continuous leakage of urine    Acutely  worsening  Unable to sense leakage of urine  In setting of back pain, concerning symptoms for nerve damage or compression  Will refer for return to neurosurgery for evaluation and imaging recommendations       Relevant Orders   Hemoglobin A1c (Completed)   Ambulatory referral to Neurosurgery   Other Visit Diagnoses     Tingling pain       Relevant Orders   RPR (Completed)   Ambulatory referral to Neurosurgery        Return in about 3 weeks (around 07/01/2022) for depression, gait, neck, dysphagia.        The entirety of the information documented in the History of Present Illness, Review of Systems and Physical Exam were personally obtained by me. Portions of this information were initially documented by Hetty Ely, CMA. I, Ronnald Ramp, MD have reviewed the documentation above for thoroughness and accuracy.    Total time spent on today's visit  was 40 minutes, including both face-to-face time , chart review of previous encounters(labs and imaging), discussing further work-up, treatment options, referrals to specialists and therapies, answering patient's questions and coordinating care.   Ronnald Ramp, MD  [facility] [phone] (phone) [phone] (fax)  [facility]

## 2022-06-11 LAB — CBC
Hematocrit: 36 % (ref 34.0–46.6)
Hemoglobin: 11.9 g/dL (ref 11.1–15.9)
MCH: 31.6 pg (ref 26.6–33.0)
MCHC: 33.1 g/dL (ref 31.5–35.7)
MCV: 96 fL (ref 79–97)
Platelets: 202 10*3/uL (ref 150–450)
RBC: 3.76 x10E6/uL — ABNORMAL LOW (ref 3.77–5.28)
RDW: 12.3 % (ref 11.7–15.4)
WBC: 6.2 10*3/uL (ref 3.4–10.8)

## 2022-06-11 LAB — HEMOGLOBIN A1C
Est. average glucose Bld gHb Est-mCnc: 117 mg/dL
Hgb A1c MFr Bld: 5.7 % — ABNORMAL HIGH (ref 4.8–5.6)

## 2022-06-11 LAB — COMPREHENSIVE METABOLIC PANEL
ALT: 27 IU/L (ref 0–32)
AST: 26 IU/L (ref 0–40)
Albumin/Globulin Ratio: 1.7 (ref 1.2–2.2)
Albumin: 3.9 g/dL (ref 3.9–4.9)
Alkaline Phosphatase: 96 IU/L (ref 44–121)
BUN/Creatinine Ratio: 13 (ref 12–28)
BUN: 13 mg/dL (ref 8–27)
Bilirubin Total: <0.2 mg/dL (ref 0.0–1.2)
CO2: 22 mmol/L (ref 20–29)
Calcium: 9.4 mg/dL (ref 8.7–10.3)
Chloride: 105 mmol/L (ref 96–106)
Creatinine, Ser: 0.97 mg/dL (ref 0.57–1.00)
Globulin, Total: 2.3 g/dL (ref 1.5–4.5)
Glucose: 90 mg/dL (ref 70–99)
Potassium: 4.6 mmol/L (ref 3.5–5.2)
Sodium: 139 mmol/L (ref 134–144)
Total Protein: 6.2 g/dL (ref 6.0–8.5)
eGFR: 64 mL/min/{1.73_m2} (ref >59)

## 2022-06-11 LAB — TSH+FREE T4
Free T4: 0.78 ng/dL — ABNORMAL LOW (ref 0.82–1.77)
TSH: 3.27 u[IU]/mL (ref 0.450–4.500)

## 2022-06-11 LAB — HIV ANTIBODY (ROUTINE TESTING W REFLEX): HIV Screen 4th Generation wRfx: NONREACTIVE

## 2022-06-11 LAB — B12 AND FOLATE PANEL
Folate: >20 ng/mL (ref >3.0)
Vitamin B-12: 982 pg/mL (ref 232–1245)

## 2022-06-11 LAB — RPR: RPR Ser Ql: NONREACTIVE

## 2022-06-11 NOTE — Assessment & Plan Note (Signed)
Chronic  Worsening  Reports grinding pain when rotating neck from side to side  Reports hx of restricted neck movement  Referral to neurosurgery and PT

## 2022-06-11 NOTE — Assessment & Plan Note (Signed)
Acute right ear pain  Recommended intranasal steroids, 2 sprays twice daily of Flonase  No signs of AOM  Has hx of nasal surgery and hearing loss, recommended evaluation by ENT, referral submitted today

## 2022-06-11 NOTE — Assessment & Plan Note (Signed)
Acutely worsening  Unable to sense leakage of urine  In setting of back pain, concerning symptoms for nerve damage or compression  Will refer for return to neurosurgery for evaluation and imaging recommendations

## 2022-06-11 NOTE — Assessment & Plan Note (Addendum)
Chronic  Severe Patient reports anhedonia and sleep challenges (either too little or too much)  Recommended trial of Rexulti 0.5mg  and 1mg , samples given today for 1 month Continue Zoloft 100mg  daily   CCM referral placed for assistance with depression, reports short term memory changes

## 2022-06-11 NOTE — Assessment & Plan Note (Signed)
Chronic  Reports veering to the right when walking and sitting  Denies vertigo symptoms  Does have hx of hearing loss  Recommended ENT evaluation for hearing loss  Collected HIV screening, CMP, CBC, vitamin B12, folate, TSH and free T4 as well as A1c and RPR

## 2022-06-11 NOTE — Assessment & Plan Note (Signed)
Chronic  ENT referral submitted

## 2022-06-11 NOTE — Assessment & Plan Note (Signed)
Counseling on removing potential tripping hazards in the home  Recommended PT, patient states this would feasible if she can have late appointments

## 2022-06-14 ENCOUNTER — Other Ambulatory Visit: Payer: Self-pay | Admitting: Family Medicine

## 2022-06-14 DIAGNOSIS — K0889 Other specified disorders of teeth and supporting structures: Secondary | ICD-10-CM

## 2022-06-14 DIAGNOSIS — R1319 Other dysphagia: Secondary | ICD-10-CM

## 2022-06-15 ENCOUNTER — Other Ambulatory Visit: Payer: Self-pay | Admitting: Family Medicine

## 2022-06-15 ENCOUNTER — Telehealth: Payer: Self-pay

## 2022-06-15 NOTE — Progress Notes (Unsigned)
  Chronic Care Management   Note  06/15/2022 Name: Shari Rivera MRN: [id number] DOB: [birth year]  [name] is a 67 y.o. year old female who is a primary care patient of Simmons-Robinson, Tawanna Cooler, MD. I reached out to Augusto Gamble by phone today in response to a referral sent by Ms. Julliette Dobek's PCP.  The first contact attempt was unsuccessful.   Follow up plan: Additional outreach attempts will be made.  Penne Lash, RMA Care Guide [facility]  [place], [place] [address] Direct Dial: [phone] [email or web address]

## 2022-06-16 NOTE — Progress Notes (Unsigned)
  Chronic Care Management   Note  06/16/2022 Name: Shari Rivera MRN: [id number] DOB: Dec 09, 1955  Shari Rivera is a 67 y.o. year old female who is a primary care patient of Simmons-Robinson, Tawanna Cooler, MD. I reached out to Augusto Gamble by phone today in response to a referral sent by Ms. [name]'s PCP.  The second contact attempt was unsuccessful.   Follow up plan: Additional outreach attempts will be made.  Penne Lash, RMA Care Guide [facility]  [place], [place] [address] Direct Dial: [phone] [email or web address]

## 2022-06-17 NOTE — Progress Notes (Signed)
  Chronic Care Management   Note  06/17/2022 Name: Amaia Ardolino MRN: [id number] DOB: 26-Mar-1955  Emeree Trunzo is a 67 y.o. year old female who is a primary care patient of Simmons-Robinson, Tawanna Cooler, MD. I reached out to Augusto Gamble by phone today in response to a referral sent by Ms. Katelyne Staszak's PCP.  Ms. Moessner was given information about Chronic Care Management services today including:  CCM service includes personalized support from designated clinical staff supervised by the physician, including individualized plan of care and coordination with other care providers 24/7 contact phone numbers for assistance for urgent and routine care needs. Service will only be billed when office clinical staff spend 20 minutes or more in a month to coordinate care. Only one practitioner may furnish and bill the service in a calendar month. The patient may stop CCM services at amy time (effective at the end of the month) by phone call to the office staff. The patient will be responsible for cost sharing (co-pay) or up to 20% of the service fee (after annual deductible is met)  Ms. Shareena Willenbrink  agreedto scheduling an appointment with the CCM RN Case Manager   Follow up plan: Patient agreed to scheduled appointment with RN Case Manager on 06/25/2022 pharm d 07/21/2022(date/time).   Penne Lash, RMA Care Guide [facility]  [place], [place] [address] Direct Dial: [phone] [email or web address]

## 2022-06-23 ENCOUNTER — Ambulatory Visit: Payer: Medicare Other | Admitting: Gastroenterology

## 2022-06-25 ENCOUNTER — Telehealth: Payer: Medicare Other

## 2022-06-25 ENCOUNTER — Telehealth: Payer: Self-pay

## 2022-06-25 NOTE — Telephone Encounter (Signed)
   CCM RN Visit Note   06/25/22 Name: Adalida Diego MRN: [id number]      DOB: December 16, 1955  Subjective: Kimberley Gummo is a 67 y.o. year old female who is a primary care patient of Simmons-Robinson, MD. The patient was referred to the Chronic Care Management team for assistance with care management needs subsequent to provider initiation of CCM services and plan of care.      An unsuccessful outreach attempt was made today for a scheduled CCM visit.    PLAN: A HIPAA compliant phone message was left for the patient providing contact information and requesting a return call.   Reason for Encounter: Patient is not currently enrolled in the CCM program. CCM status changed to previously enrolled.   [facility]/Chronic Care Management [phone]

## 2022-06-30 ENCOUNTER — Telehealth: Payer: Self-pay

## 2022-06-30 NOTE — Progress Notes (Signed)
  Care Coordination Note  06/30/2022 Name: Shari Rivera MRN: [id number] DOB: Sep 19, 1955  [name] is a 67 y.o. year old female who is a primary care patient of Simmons-Robinson, Tawanna Cooler, MD and is actively engaged with the Chronic Care Management team. I reached out to Augusto Gamble by phone today to assist with re-scheduling an initial visit with the RN Case Manager  Follow up plan: Unsuccessful telephone outreach attempt made. A HIPAA compliant phone message was left for the patient providing contact information and requesting a return call.  The care management team will reach out to the patient again over the next 7 days.  If patient returns call to provider office, please advise to call CCM Care Guide Levis Nazir  at [phone]  Penne Lash, RMA Care Guide [facility]  [place], [place] [address] Direct Dial: [phone] [email or web address]

## 2022-07-02 NOTE — Progress Notes (Deleted)
Referring Physician:  Ronnald Ramp, MD [address] [place],  [place] [address]  Primary Physician:  Ronnald Ramp, MD  History of Present Illness: 07/02/2022*** [name] has a history of osteoporosis, depression, hyperlipidemia.   Recently seen by PCP for neck pain. Also with history of chronic urinary incontinence- can't always feel when she has to go.       Duration: *** Location: *** Quality: *** Severity: ***  Precipitating: aggravated by *** Modifying factors: made better by *** Weakness: none Timing: *** Bowel/Bladder Dysfunction: none  Conservative measures:  Physical therapy: ordered by PCP***  Multimodal medical therapy including regular antiinflammatories: ***  Injections: *** epidural steroid injections  Past Surgery: ***  Shari Rivera has ***no symptoms of cervical myelopathy.  The symptoms are causing a significant impact on the patient's life.   Review of Systems:  A 10 point review of systems is negative, except for the pertinent positives and negatives detailed in the HPI.  Past Medical History: Past Medical History:  Diagnosis Date   Allergy    Anemia    Anxiety    Arthritis    Cancer (HCC)    Cataract    Depression    Endometriosis     Past Surgical History: Past Surgical History:  Procedure Laterality Date   APPENDECTOMY     NASAL SEPTUM SURGERY     SMALL INTESTINE SURGERY      Allergies: Allergies as of 07/06/2022 - Review Complete 06/10/2022  Allergen Reaction Noted   Codeine Diarrhea and Nausea And Vomiting 05/27/2015    Medications: Outpatient Encounter Medications as of 07/06/2022  Medication Sig   alendronate (FOSAMAX) 70 MG tablet Take 1 tablet (70 mg total) by mouth once a week. Take with a full glass of water on an empty stomach.   brexpiprazole (REXULTI) 0.25 MG TABS tablet Take 1 tablet (0.25 mg total) by mouth daily.   Calcium Carbonate-Vitamin D 600-400  MG-UNIT tablet Take 1 tablet by mouth daily.   Coenzyme Q10 (COQ-10) 100 MG capsule Take 100 mg by mouth daily.   famotidine (PEPCID) 40 MG tablet Take 1 tablet (40 mg total) by mouth at bedtime.   fluticasone (FLONASE) 50 MCG/ACT nasal spray Place 2 sprays into both nostrils daily.   gabapentin (NEURONTIN) 100 MG capsule TAKE 2 CAPSULES BY MOUTH TWICE DAILY.   Multiple Vitamin (MULTIVITAMIN WITH MINERALS) TABS tablet Take 1 tablet by mouth daily.   rosuvastatin (CRESTOR) 20 MG tablet Take 1 tablet (20 mg total) by mouth daily.   sertraline (ZOLOFT) 100 MG tablet TAKE ONE TABLET BY MOUTH EVERY DAY   No facility-administered encounter medications on file as of 07/06/2022.    Social History: Social History   Tobacco Use   Smoking status: Former   Smokeless tobacco: Never  Building services engineer Use: Former  Substance Use Topics   Alcohol use: No   Drug use: No    Family Medical History: Family History  Problem Relation Age of Onset   Cancer Mother    Stroke Mother    COPD Mother    Heart failure Mother    Atrial fibrillation Mother    Heart disease Father    Cancer Maternal Grandmother    Diabetes Maternal Grandmother    Kidney disease Maternal Grandfather    Heart disease Maternal Grandfather    Heart disease Paternal Grandfather    Breast cancer Other        mggm    Physical Examination: There  were no vitals filed for this visit.  General: Patient is well developed, well nourished, calm, collected, and in no apparent distress. Attention to examination is appropriate.  Respiratory: Patient is breathing without any difficulty.   NEUROLOGICAL:     Awake, alert, oriented to person, place, and time.  Speech is clear and fluent. Fund of knowledge is appropriate.   Cranial Nerves: Pupils equal round and reactive to light.  Facial tone is symmetric.    *** ROM of cervical spine *** pain *** posterior cervical tenderness. *** tenderness in bilateral trapezial region.    *** ROM of lumbar spine *** pain *** posterior lumbar tenderness.   No abnormal lesions on exposed skin.   Strength: Side Biceps Triceps Deltoid Interossei Grip Wrist Ext. Wrist Flex.  R 5 5 5 5 5 5 5   L 5 5 5 5 5 5 5    Side Iliopsoas Quads Hamstring PF DF EHL  R 5 5 5 5 5 5   L 5 5 5 5 5 5    Reflexes are ***2+ and symmetric at the biceps, triceps, brachioradialis, patella and achilles.   Hoffman's is absent.  Clonus is not present.   Bilateral upper and lower extremity sensation is intact to light touch.     Gait is normal.   ***No difficulty with tandem gait.    Medical Decision Making  Imaging: Xrays of cervical spine dated 03/25/21: FINDINGS: On the lateral view the cervical spine is visualized to the level of C7. Grade 1 anterolisthesis of C3 on C4 and C4 on C5. Of the normal cervical lordosis. Alignment is otherwise normal.   Dens is well positioned between the lateral masses of C1. There is limited evaluation of the dens for acute fracture on the open-mouth view due to overlying osseous structures.   Multilevel degenerative changes of the spine most prominent at the C5-C7 levels. Associated at least moderate osseous neural foraminal stenosis at the C5-C6 and C6-C7 levels. No acute displaced fracture is detected.No aggressive-appearing focal osseous lesions.   Pre-vertebral soft tissues are within normal limits.   IMPRESSION: 1. No acute displaced fracture or traumatic listhesis of the cervical spine. 2. 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 Limited evaluation due to overlapping osseous structures and overlying soft tissues.     Electronically Signed   By: Tish Frederickson M.D.   On: 03/26/2021 20:48   I have personally reviewed the images and agree with the above interpretation.  Assessment and Plan: [name] is a pleasant 67 y.o. female has ***  Treatment options  discussed with patient and following plan made:   - Order for physical therapy for *** spine ***. Patient to call to schedule appointment. *** - Continue current medications including ***. Reviewed dosing and side effects.  - Prescription for ***. Reviewed dosing and side effects. Take with food.  - Prescription for *** to take prn muscle spasms. Reviewed dosing and side effects. Discussed this can cause drowsiness.  - MRI of *** to further evaluate *** radiculopathy. No improvement time or medications (***).  - Referral to [facility] at [facility] to discuss possible *** injections.  - Will schedule phone visit to review MRI results once I get them back.   I spent a total of *** minutes in face-to-face and non-face-to-face activities related to this patient's care today including review of outside records, review of imaging, review of symptoms, physical exam, discussion of differential diagnosis, discussion  of treatment options, and documentation.   Thank you for involving me in the care of this patient.   Drake Leach PA-C Dept. of Neurosurgery

## 2022-07-06 ENCOUNTER — Ambulatory Visit: Payer: Medicare Other | Admitting: Orthopedic Surgery

## 2022-07-07 ENCOUNTER — Encounter: Payer: Self-pay | Admitting: Family Medicine

## 2022-07-07 ENCOUNTER — Ambulatory Visit (INDEPENDENT_AMBULATORY_CARE_PROVIDER_SITE_OTHER): Payer: Medicare Other | Admitting: Family Medicine

## 2022-07-07 VITALS — BP 112/60 | HR 78 | Ht 62.0 in | Wt 120.7 lb

## 2022-07-07 DIAGNOSIS — Z23 Encounter for immunization: Secondary | ICD-10-CM

## 2022-07-07 DIAGNOSIS — F3341 Major depressive disorder, recurrent, in partial remission: Secondary | ICD-10-CM

## 2022-07-07 NOTE — Progress Notes (Signed)
I,Sha'taria Tyson,acting as a Neurosurgeon for [employer], MD.,have documented all relevant documentation on the behalf of Ronnald Ramp, MD,as directed by  Ronnald Ramp, MD while in the presence of Ronnald Ramp, MD.   Established patient visit   Patient: Shari Rivera   DOB: 1955/01/28   67 y.o. Female  MRN: [id number] Visit Date: 07/07/2022  Today's healthcare provider: Ronnald Ramp, MD   No chief complaint on file.  Subjective    HPI  -Shingles Vaccine: ok to administer -Pneumonia Vaccine: ok to administer -Tetanus Vaccine: recommended to receive at pharmacy   Depression, Follow-up  She  was last seen for this 3 weeks ago. Changes made at last visit include trial of Rexulti 0.5mg  and 1mg , samples given today for 1 month. Continue Zoloft 100mg  daily.   She reports excellent compliance with treatment. She is not having side effects.   She reports excellent tolerance of treatment. Current symptoms include: depressed mood, difficulty concentrating, fatigue, and insomnia She feels she is  improving  since last visit.  She reports feeling much better since our last visit  States she has found a church it has been a positive experience        07/07/2022    3:56 PM 06/10/2022    3:45 PM 03/10/2022    3:41 PM  Depression screen PHQ 2/9  Decreased Interest 3 3 3   Down, Depressed, Hopeless 3 3 3   PHQ - 2 Score 6 6 6   Altered sleeping 3 3 3   Tired, decreased energy 3 3 3   Change in appetite 3 3 3   Feeling bad or failure about yourself  0 2 3  Trouble concentrating 3 3 3   Moving slowly or fidgety/restless 0 2 0  Suicidal thoughts 0 0 0  PHQ-9 Score 18 22 21   Difficult doing work/chores Not difficult at all Somewhat difficult Not difficult at all   -----------------------------------------------------------------------------------------    Dysphagia - states she has appointment scheduled, reports still not able to  eat as much due to dentures. Unable to establish with nutritionist due to insurance limitations    Medications: Outpatient Medications Prior to Visit  Medication Sig   alendronate (FOSAMAX) 70 MG tablet Take 1 tablet (70 mg total) by mouth once a week. Take with a full glass of water on an empty stomach.   brexpiprazole (REXULTI) 0.25 MG TABS tablet Take 1 tablet (0.25 mg total) by mouth daily.   Calcium Carbonate-Vitamin D 600-400 MG-UNIT tablet Take 1 tablet by mouth daily.   Coenzyme Q10 (COQ-10) 100 MG capsule Take 100 mg by mouth daily.   famotidine (PEPCID) 40 MG tablet Take 1 tablet (40 mg total) by mouth at bedtime.   fluticasone (FLONASE) 50 MCG/ACT nasal spray Place 2 sprays into both nostrils daily.   gabapentin (NEURONTIN) 100 MG capsule TAKE 2 CAPSULES BY MOUTH TWICE DAILY.   Multiple Vitamin (MULTIVITAMIN WITH MINERALS) TABS tablet Take 1 tablet by mouth daily.   rosuvastatin (CRESTOR) 20 MG tablet Take 1 tablet (20 mg total) by mouth daily.   sertraline (ZOLOFT) 100 MG tablet TAKE ONE TABLET BY MOUTH EVERY DAY   No facility-administered medications prior to visit.    Review of Systems     Objective    BP 112/60 (BP Location: Right Arm, Patient Position: Sitting, Cuff Size: Normal)   Pulse 78   Ht 5\' 2"  (1.575 m)   Wt 120 lb 11.2 oz (54.7 kg)   SpO2 100%   BMI 22.08 kg/m  Physical Exam Vitals reviewed.  Constitutional:      General: She is not in acute distress.    Appearance: Normal appearance. She is not ill-appearing, toxic-appearing or diaphoretic.  Eyes:     Conjunctiva/sclera: Conjunctivae normal.  Cardiovascular:     Rate and Rhythm: Normal rate and regular rhythm.     Pulses: Normal pulses.     Heart sounds: Normal heart sounds. No murmur heard.    No friction rub. No gallop.  Pulmonary:     Effort: Pulmonary effort is normal. No respiratory distress.     Breath sounds: Normal breath sounds. No stridor. No wheezing, rhonchi or rales.   Abdominal:     General: Bowel sounds are normal. There is no distension.     Palpations: Abdomen is soft.     Tenderness: There is no abdominal tenderness.  Musculoskeletal:     Right lower leg: No edema.     Left lower leg: No edema.  Skin:    Findings: No erythema or rash.  Neurological:     Mental Status: She is alert and oriented to person, place, and time.      No results found for any visits on 07/07/22.  Assessment & Plan     Problem List Items Addressed This Visit       Other   Recurrent major depressive disorder, in partial remission (HCC) - Primary    Chronic  Reports improved mood and symptoms  Continue Zoloft 100mg  and Rexulti 0.25mg           Return in about 3 months (around 10/07/2022) for chronic f/u .        The entirety of the information documented in the History of Present Illness, Review of Systems and Physical Exam were personally obtained by me. Portions of this information were initially documented by Sha'taria Tyson,CMA. I, Ronnald Ramp, MD have reviewed the documentation above for thoroughness and accuracy.      Ronnald Ramp, MD  [facility] [phone] (phone) [phone] (fax)  [facility]

## 2022-07-08 ENCOUNTER — Other Ambulatory Visit: Payer: Self-pay | Admitting: Family Medicine

## 2022-07-08 NOTE — Assessment & Plan Note (Signed)
Chronic  Reports improved mood and symptoms  Continue Zoloft 100mg  and Rexulti 0.25mg 

## 2022-07-08 NOTE — Telephone Encounter (Signed)
Medication Refill - Medication: brexpiprazole (REXULTI) 0.25 MG TABS tablet   Has the patient contacted their pharmacy? No. (Agent: If no, request that the patient contact the pharmacy for the refill. If patient does not wish to contact the pharmacy document the reason why and proceed with request.) (Agent: If yes, when and what did the pharmacy advise?)  Preferred Pharmacy (with phone number or street name):  [facility] - [place], [place] - [address] Phone: [phone]  Fax: [phone]     Has the patient been seen for an appointment in the last year OR does the patient have an upcoming appointment? Yes.    Agent: Please be advised that RX refills may take up to 3 business days. We ask that you follow-up with your pharmacy.

## 2022-07-09 NOTE — Telephone Encounter (Signed)
Requested medication (s) are due for refill today: yes  Requested medication (s) are on the active medication list: yes  Last refill:  06/10/22  Future visit scheduled: yes  Notes to clinic:  Medication not assigned to a protocol, review manually.      Requested Prescriptions  Pending Prescriptions Disp Refills   brexpiprazole (REXULTI) 0.25 MG TABS tablet 60 tablet 0    Sig: Take 1 tablet (0.25 mg total) by mouth daily.     Off-Protocol Failed - 07/08/2022  4:36 PM      Failed - Medication not assigned to a protocol, review manually.      Passed - Valid encounter within last 12 months    Recent Outpatient Visits           2 days ago Recurrent major depressive disorder, in partial remission (HCC)   [place] [facility] Simmons-Robinson, [place], MD   4 weeks ago Falls frequently   [place] [facility] Simmons-Robinson, [place], MD   4 months ago Age-related osteoporosis without current pathological fracture   [place] [facility] Simmons-Robinson, [place], MD   4 months ago Age-related osteoporosis without current pathological fracture   [place] [facility] Ronnald Ramp, MD   1 year ago Neck pain   [place] [facility] Bosie Clos, MD       Future Appointments             In 3 months Simmons-Robinson, Tawanna Cooler, MD [facility], [place]

## 2022-07-12 NOTE — Therapy (Unsigned)
OUTPATIENT PHYSICAL THERAPY EVALUATION   Patient Name: Shari Rivera MRN: [id number] DOB:February 24, 1955, 67 y.o., female Today's Date: 07/14/2022  END OF SESSION:  PT End of Session - 07/14/22 1807     Visit Number 1    Number of Visits 13    Date for PT Re-Evaluation 10/06/22    Authorization Type [employer] reporting period from 07/14/2022    Authorization Time Period VL:30 PT/OT per cal yr-30 remain    Authorization - Visit Number 1    Authorization - Number of Visits 30    Progress Note Due on Visit 10    PT Start Time 1658    PT Stop Time 1750    PT Time Calculation (min) 52 min    Activity Tolerance Patient tolerated treatment well    Behavior During Therapy WFL for tasks assessed/performed             Past Medical History:  Diagnosis Date   Allergy    Anemia    Anxiety    Arthritis    Cancer (HCC)    Cataract    Depression    Endometriosis    Past Surgical History:  Procedure Laterality Date   APPENDECTOMY     NASAL SEPTUM SURGERY     SMALL INTESTINE SURGERY     Patient Active Problem List   Diagnosis Date Noted   Falls frequently 06/10/2022   Neck pain 06/10/2022   Abnormal gait 06/10/2022   Acute otalgia, right 06/10/2022   Continuous leakage of urine 06/10/2022   Hearing loss associated with syndrome of both ears 06/10/2022   Esophageal dysphagia 02/12/2022   Recurrent major depressive disorder, in partial remission (HCC) 02/12/2022   Hyperlipidemia 02/12/2022   Age-related osteoporosis without current pathological fracture 02/11/2022    PCP: Ronnald Ramp, MD  REFERRING PROVIDER: Ronnald Ramp, MD  REFERRING DIAG: falls frequently, neck pain, abnormal gait  Rationale for Evaluation and Treatment: Rehabilitation  THERAPY DIAG:  Cervicalgia  Unsteadiness on feet  History of falling  ONSET DATE: neck pain started Easter 2 years ago, balance problems started about a year prior to PT eval  SUBJECTIVE:                                                                                                                                                                                            SUBJECTIVE STATEMENT: Patient states she has difficulty with falling to the right but her neck pain is most important to her today. Patient states her neck pain started Easter 2 years ago on the left side. She woke up and could not move her neck. IT  got batter so she could turn her head to the right but not the left. She went two years like that where she could barely drive because it was hard to turn her head to the left. Now the pain has switched to the right side about 6 moths ago and she can turn her head both directions okay. Her pain was worse about 2 months ago, but now it is better than that. She had  a My[employer]illow from [employer] so she threw it away because she did not like the man who created them the the day before her neck pain started at [place]. She went to someone who put her on a muscle relaxer her neck and back and gabapentin (both of those helped). She still takes gabapentin.   She has been falling towards the right with no warning for about a year. There has been no change in how often it happens since it started. It happens 1-2 times a month. She could be sitting, standing, or doing nothing at all. She states she has vision and ear problems at baseline and has not noticed any difference in these when she gets the falling over. She states she had cataract surgery and she thinks the lens has shifted in the left eye. She states she is seeing an ENT in the future because she is having ear pain, a short shrill noise followed by feeling deaf for about 2 minutes. This noise and deafness happens mostly in the right ear but also happens in the left ear. She does not wear hearing aides. She states she feels like she has better hearing in the right ear and sees better from the left eye. She has floaters in the right eye. She has  only actually fallen all the way down twice. She denies any spinning sensations but does endorse her visual field jumping around. She states she feels a little unsteady sometimes and states she does no know what it feels like to be drunk. She has move things out of the way in her home to help decrease trip hazards.   Has osteoporosis confirmed on DXA scan 02/01/2022.   PERTINENT HISTORY:  Patient is a 67 y.o. female who presents to outpatient physical therapy with a referral for medical diagnosis falls frequently, neck pain, abnormal gait. This patient's chief complaints consist of chronic neck pain and sudden LOB to the right resulting in falls or near falls leading to the following functional deficits: difficulty bending over, cleaning, driving, turing head, sleeping, food prep, completing daily activities, sitting,  ambulation, without falls and injury. Relevant past medical history and comorbidities include has Age-related osteoporosis; Esophageal dysphagia; Recurrent major depressive disorder, in partial remission (HCC); Hyperlipidemia; Falls frequently; Neck pain; Abnormal gait; Acute otalgia, right; Continuous leakage of urine; and Hearing loss associated with syndrome of both ears on their problem list, low back pain, small intestine surgery, nasal septum surgery, appendectomy, cancer (cervical cancer, tip of cervix removed and that resolved it > 5 years ago), bilateral hand paresthesia, endometriosis, arthritis, anxiety, anemia, cataracts; sciatica, osteoporosis, 28lb weight loss over 3 months (over holidays, states she thinks she was too tired to eat), unexplained dropping things with R UE, continuous urinary incontinence without urge (PCP referred her to neurosurgery). Patient denies hx of stroke, seizures, lung problems, heart problems, and spinal surgery. She states she went from 136-108# from Halloween to Jan 1. She thinks maybe she was so tired she was not eating. She has recovered her weight to  about 120#  now. She does endorse dropping things with her right hand a lot. She also has urinary incontinence where she is leaking without knowing it. She states she uses poise pads. PCP aware of incontinence and has referred pt to neurosurgery.   PAIN:  Are you having pain? Yes: NPRS scale: Current: 2/10,  Best: 2/10, Worst: 10/10. Pain location: right neck (denies history of it radiating into R or left arm or into head), she does endorse right UE falls asleep including right now.  Pain description: unsure Aggravating factors: prolonged sitting, not moving Relieving factors: moving head around and cracking neck, heating pad, muscle relaxer, gabapentin.  FUNCTIONAL LIMITATIONS: difficulty bending over, cleaning, driving, turing head, sleeping, food prep, completing daily activities, sitting,  ambulation, without falls and injury.   LEISURE: watch westerns on TV  PRECAUTIONS: Fall  WEIGHT BEARING RESTRICTIONS: No  FALLS:  Has patient fallen in last 6 months? Yes. Number of falls 6 close falls, but did not fall all the way down  LIVING ENVIRONMENT: Lives with: alone  Lives in: one story home Stairs: ramped entrance with handrails Has following equipment at home: Single point cane, Quad cane small base, Walker - 2 wheeled, Wheelchair (manual), shower chair, and Ramped entry. Tub shower combo.   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   PLOF: Independent  PATIENT GOALS: "just trying to get what my doctor wants me to do" "to get everything like it's supposed to be back"  OBJECTIVE  DIAGNOSTIC FINDINGS:  Cervical spine xray report from 03/25/2021:  CLINICAL DATA:  neck pain and tingling   EXAM: CERVICAL SPINE - COMPLETE 4+ VIEW   COMPARISON:  None.   FINDINGS: On the lateral view the cervical spine is visualized to the level of C7. Grade 1 anterolisthesis of C3 on C4 and C4 on C5. Of the normal cervical lordosis. Alignment is otherwise normal.   Dens is well  positioned between the lateral masses of C1. There is limited evaluation of the dens for acute fracture on the open-mouth view due to overlying osseous structures.   Multilevel degenerative changes of the spine most prominent at the C5-C7 levels. Associated at least moderate osseous neural foraminal stenosis at the C5-C6 and C6-C7 levels. No acute displaced fracture is detected.No aggressive-appearing focal osseous lesions.   Pre-vertebral soft tissues are within normal limits.   IMPRESSION: 1. No acute displaced fracture or traumatic listhesis of the cervical spine. 2. 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 Limited evaluation due to overlapping osseous structures and overlying soft tissues.     Electronically Signed   By: Tish Frederickson M.D.   On: 03/26/2021 20:48  SELF- REPORTED FUNCTION FOTO score: 46/100 (neck questionnaire)  OBSERVATION/INSPECTION Posture Posture (seated): strong forward head, rounded shoulders, increased thoracic kyphosis.  Anthropometrics Tremor: none Body composition: low muscle mass throughout body.  Muscle bulk: low muscle mass throughout.  Functional Mobility Bed mobility: supine <> sit I Transfers: sit <> stand I Gait: grossly WFL for household and short community ambulation. More detailed gait analysis deferred to later date as needed.   NEUROLOGICAL Dermatomes C3-T1 appears equal and intact to light touch except the following: R C3-5 diminished compared to L. Hands with paresthesia bilaterally.   SPINE MOTION CERVICAL SPINE AROM *Indicates pain Flexion: 70 Extension: 70 increased right sided pain Side Flexion:   R 35  L 25 pain over right neck Rotation:  R 65 right sided neck pain  with radiation  L 50 Protraction: WFL Retraction: 1 inch, increased pain at right  PERIPHERAL JOINT MOTION (in degrees) ACTIVE RANGE OF MOTION (AROM) 07/14/2022: B UE  grossly [facility]   MUSCLE PERFORMANCE (MMT):  07/14/2022: B UE grossly WFL for tasks assessed  SPECIAL TESTS: CERVICAL SPINE Cervical spine axial compression: negative Spurling's part B:  R = pain at right neck, L = pain at right neck Cervical spine axial distraction: negative  ACCESSORY MOTION: Tender with reproduction of pain with CPA to mid to lower cervical spine  PALPATION: TTP and tight at right upper trap and cervical paraspinasl.    TODAY'S TREATMENT:  Therapeutic exercise: to centralize symptoms and improve ROM, strength, muscular endurance, and activity tolerance required for successful completion of functional activities. - hooklying deep neck flexor nod, 1x5 with cuing for decreased use of SCM - seated cervical retraction, 1x10 - Education on diagnosis, prognosis, POC, anatomy and physiology of current condition.  - Education on HEP including handout   Manual therapy: to reduce pain and tissue tension, improve range of motion, neuromodulation, in order to promote improved ability to complete functional activities. HOOKLYING - STM to posterior cervical spine, focusing on tender/tight portion of R UT  Pt required multimodal cuing for proper technique and to facilitate improved neuromuscular control, strength, range of motion, and functional ability resulting in improved performance and form.   PATIENT EDUCATION:  Education details: Exercise purpose/form. Self management techniques. Education on diagnosis, prognosis, POC, anatomy and physiology of current condition. Education on HEP including handout. Reviewed cancelation/no-show policy with patient and confirmed patient has correct phone number for clinic; patient verbalized understanding (07/14/22). Person educated: Patient Education method: Explanation, Demonstration, Tactile cues, Verbal cues, and Handouts Education comprehension: verbalized understanding, returned demonstration, and needs further education  HOME  EXERCISE PROGRAM: Access Code: [email or web address] URL: [email or web address] Date: 07/14/2022 Prepared by: Norton Blizzard  Exercises - Supine Deep Neck Flexor Nods  - 1 x daily - 2 sets - 10 reps - 5 seconds hold - Seated Cervical Retraction  - 1-2 x daily - 2 sets - 10 reps  ASSESSMENT:  CLINICAL IMPRESSION: Patient is a 67 y.o. female referred to outpatient physical therapy with a medical diagnosis of falls frequently, neck pain, abnormal gait who presents with signs and symptoms consistent with chronic right sided neck pain, balance disorder that causes frequent near falls and increased patient's risk for falls and injury. Today's examination was focused on neck pain. Patient appears to currently have pain associated with the mid to lower cervical spine and right upper trap region with no hard neuro signs down the right UE. Patient die not demonstrate any balance deficits today in clinic, but describes sudden imbalance that causes her to fall over to the right. Plan to examine this further as appropriate at future visit. Patient presents with significant pain, ROM, joint stiffness, motor control, muscle tension, muscle performance (strength/power/endurance), and activity tolerance impairments that are limiting ability to complete bending over, cleaning, driving, turing head, sleeping, food prep, completing daily activities, sitting,  ambulation, without falls and injury, without difficulty. Patient will benefit from skilled physical therapy intervention to address current body structure impairments and activity limitations to improve function and work towards goals set in current POC in order to return to prior level of function or maximal functional improvement.    OBJECTIVE IMPAIRMENTS: decreased activity tolerance, decreased balance, decreased endurance, decreased knowledge of condition, decreased mobility, difficulty walking, decreased ROM, decreased strength, hypomobility, impaired perceived  functional ability, increased muscle  spasms, impaired UE functional use, improper body mechanics, and pain.   ACTIVITY LIMITATIONS: lifting, bending, sitting, standing, sleeping, continence, and locomotion level  PARTICIPATION LIMITATIONS: cleaning, laundry, interpersonal relationship, driving, community activity, occupation, and   difficulty bending over, cleaning, driving, turing head, sleeping, food prep, completing daily activities, sitting,  ambulation, without falls and injury  PERSONAL FACTORS: Past/current experiences, Time since onset of injury/illness/exacerbation, and 3+ comorbidities:   Age-related osteoporosis; Esophageal dysphagia; Recurrent major depressive disorder, in partial remission (HCC); Hyperlipidemia; Falls frequently; Neck pain; Abnormal gait; Acute otalgia, right; Continuous leakage of urine; and Hearing loss associated with syndrome of both ears on their problem list, low back pain, small intestine surgery, nasal septum surgery, appendectomy, cancer (cervical cancer, tip of cervix removed and that resolved it > 5 years ago), bilateral hand paresthesia, endometriosis, arthritis, anxiety, anemia, cataracts; sciatica, osteoporosis, 28lb weight loss over 3 months (over holidays, states she thinks she was too tired to eat), unexplained dropping things with R UE, continuous urinary incontinence without urge (PCP referred her to neurosurgery) are also affecting patient's functional outcome.   REHAB POTENTIAL: Good  CLINICAL DECISION MAKING: Evolving/moderate complexity  EVALUATION COMPLEXITY: Moderate   GOALS: Goals reviewed with patient? No  SHORT TERM GOALS: Target date: 07/28/2022  Patient will be independent with initial home exercise program for self-management of symptoms. Baseline: Initial HEP provided at IE (07/14/22); Goal status: INITIAL   LONG TERM GOALS: Target date: 10/06/2022  Patient will be independent with a long-term home exercise program for  self-management of symptoms.  Baseline: Initial HEP provided at IE (07/14/22); Goal status: INITIAL  2.  Patient will demonstrate improved FOTO to equal or greater than 56 by visit #10 to demonstrate improvement in overall condition and self-reported functional ability.  Baseline: 46 (07/14/22); Goal status: INITIAL  3.  Patient will demonstrate full cervical spine AROM with no increase in pain except intermittent end range discomfort to improve her ability to drive and pick things up from the floor with less difficulty.  Baseline: painful with limitations in some directions - see objective (07/14/22); Goal status: INITIAL  4.  Patient will report decrease in frequency of LOB to the right by equal or 50% to improve her fall risk and safety with weight bearing activities.  Baseline: 1-2 times a month (07/14/22); Goal status: INITIAL  5.  Patient will complete community, work and/or recreational activities with 50% less limitation due to current condition.  Baseline: difficulty bending over, cleaning, driving, turing head, sleeping, food prep, completing daily activities, sitting,  ambulation, without falls and injury (07/14/22); Goal status: INITIAL  6.  Patient will score equal or greater than 25/30 on the Functional Gait Assessment to demonstrate low fall risk to decrease her chances of injury from fall.  Baseline: to be measured visit 2 as appropriate (07/14/2022);  Goal status: INITIAL   PLAN:  PT FREQUENCY: 1-2x/week  PT DURATION: 12 weeks  PLANNED INTERVENTIONS: Therapeutic exercises, Therapeutic activity, Neuromuscular re-education, Balance training, Gait training, Patient/Family education, Self Care, Joint mobilization, Canalith repositioning, DME instructions, Dry Needling, Electrical stimulation, Spinal mobilization, Cryotherapy, Moist heat, Manual therapy, and Re-evaluation.  PLAN FOR NEXT SESSION: update HEP as appropriate, continue examination as appropriate, progressive  neck/postural/functional strengthening and ROM exercises as tolerated. Balance/vestibular exercises as appropriate. Education. Manual therapy/dry needling as appropriate.    Cira Rue, PT, DPT 07/14/2022, 6:35 PM   [facility] [address] [place], [place] [address] P: [phone] I F: [phone]

## 2022-07-14 ENCOUNTER — Encounter: Payer: Self-pay | Admitting: Physical Therapy

## 2022-07-14 ENCOUNTER — Ambulatory Visit: Payer: Medicare Other | Attending: Family Medicine | Admitting: Physical Therapy

## 2022-07-14 DIAGNOSIS — M542 Cervicalgia: Secondary | ICD-10-CM | POA: Diagnosis not present

## 2022-07-14 DIAGNOSIS — R269 Unspecified abnormalities of gait and mobility: Secondary | ICD-10-CM | POA: Insufficient documentation

## 2022-07-14 DIAGNOSIS — Z9181 History of falling: Secondary | ICD-10-CM | POA: Diagnosis not present

## 2022-07-14 DIAGNOSIS — R2681 Unsteadiness on feet: Secondary | ICD-10-CM | POA: Diagnosis not present

## 2022-07-14 DIAGNOSIS — R296 Repeated falls: Secondary | ICD-10-CM | POA: Insufficient documentation

## 2022-07-19 NOTE — Therapy (Deleted)
OUTPATIENT PHYSICAL THERAPY TREATMENT NOTE   Patient Name: Shari Rivera MRN: [id number] DOB:1955-04-04, 67 y.o., female Today's Date: 07/19/2022  END OF SESSION:    Past Medical History:  Diagnosis Date   Allergy    Anemia    Anxiety    Arthritis    Cancer (HCC)    Cataract    Depression    Endometriosis    Past Surgical History:  Procedure Laterality Date   APPENDECTOMY     NASAL SEPTUM SURGERY     SMALL INTESTINE SURGERY     Patient Active Problem List   Diagnosis Date Noted   Falls frequently 06/10/2022   Neck pain 06/10/2022   Abnormal gait 06/10/2022   Acute otalgia, right 06/10/2022   Continuous leakage of urine 06/10/2022   Hearing loss associated with syndrome of both ears 06/10/2022   Esophageal dysphagia 02/12/2022   Recurrent major depressive disorder, in partial remission (HCC) 02/12/2022   Hyperlipidemia 02/12/2022   Age-related osteoporosis without current pathological fracture 02/11/2022    PCP: Ronnald Ramp, MD  REFERRING PROVIDER: Ronnald Ramp, MD  REFERRING DIAG: falls frequently, neck pain, abnormal gait  Rationale for Evaluation and Treatment: Rehabilitation  THERAPY DIAG:  No diagnosis found.  ONSET DATE: neck pain started Easter 2 years ago, balance problems started about a year prior to PT eval  PERTINENT HISTORY:  Patient is a 67 y.o. female who presents to outpatient physical therapy with a referral for medical diagnosis falls frequently, neck pain, abnormal gait. This patient's chief complaints consist of chronic neck pain and sudden LOB to the right resulting in falls or near falls leading to the following functional deficits: difficulty bending over, cleaning, driving, turing head, sleeping, food prep, completing daily activities, sitting,  ambulation, without falls and injury. Relevant past medical history and comorbidities include has Age-related osteoporosis; Esophageal dysphagia; Recurrent major  depressive disorder, in partial remission (HCC); Hyperlipidemia; Falls frequently; Neck pain; Abnormal gait; Acute otalgia, right; Continuous leakage of urine; and Hearing loss associated with syndrome of both ears on their problem list, low back pain, small intestine surgery, nasal septum surgery, appendectomy, cancer (cervical cancer, tip of cervix removed and that resolved it > 5 years ago), bilateral hand paresthesia, endometriosis, arthritis, anxiety, anemia, cataracts; sciatica, osteoporosis, 28lb weight loss over 3 months (over holidays, states she thinks she was too tired to eat), unexplained dropping things with R UE, continuous urinary incontinence without urge (PCP referred her to neurosurgery). Patient denies hx of stroke, seizures, lung problems, heart problems, and spinal surgery. She states she went from 136-108# from Halloween to Jan 1. She thinks maybe she was so tired she was not eating. She has recovered her weight to about 120# now. She does endorse dropping things with her right hand a lot. She also has urinary incontinence where she is leaking without knowing it. She states she uses poise pads. PCP aware of incontinence and has referred pt to neurosurgery.   SUBJECTIVE:  SUBJECTIVE STATEMENT: ***   PAIN:  Are you having pain? *** NPRS: *** Pain location: ***  PRECAUTIONS: Fall  PATIENT GOALS: "just trying to get what my doctor wants me to do" "to get everything like it's supposed to be back"  OBJECTIVE  There were no vitals filed for this visit.   TODAY'S TREATMENT:  Therapeutic exercise: to centralize symptoms and improve ROM, strength, muscular endurance, and activity tolerance required for successful completion of functional activities. - hooklying deep neck flexor nod, 1x5 with cuing  for decreased use of SCM - seated cervical retraction, 1x10 - Education on diagnosis, prognosis, POC, anatomy and physiology of current condition.  - Education on HEP including handout   Manual therapy: to reduce pain and tissue tension, improve range of motion, neuromodulation, in order to promote improved ability to complete functional activities. HOOKLYING - STM to posterior cervical spine, focusing on tender/tight portion of R UT  Pt required multimodal cuing for proper technique and to facilitate improved neuromuscular control, strength, range of motion, and functional ability resulting in improved performance and form.   PATIENT EDUCATION:  Education details: Exercise purpose/form. Self management techniques. Education on diagnosis, prognosis, POC, anatomy and physiology of current condition. Education on HEP including handout. Reviewed cancelation/no-show policy with patient and confirmed patient has correct phone number for clinic; patient verbalized understanding (07/14/22). Person educated: Patient Education method: Explanation, Demonstration, Tactile cues, Verbal cues, and Handouts Education comprehension: verbalized understanding, returned demonstration, and needs further education  HOME EXERCISE PROGRAM: Access Code: [email or web address] URL: [email or web address] Date: 07/14/2022 Prepared by: Norton Blizzard  Exercises - Supine Deep Neck Flexor Nods  - 1 x daily - 2 sets - 10 reps - 5 seconds hold - Seated Cervical Retraction  - 1-2 x daily - 2 sets - 10 reps  ASSESSMENT:  CLINICAL IMPRESSION: ***  From Initial PT Evaluation 07/14/2022:  Patient is a 67 y.o. female referred to outpatient physical therapy with a medical diagnosis of falls frequently, neck pain, abnormal gait who presents with signs and symptoms consistent with chronic right sided neck pain, balance disorder that causes frequent near falls and increased patient's risk for falls and injury. Today's  examination was focused on neck pain. Patient appears to currently have pain associated with the mid to lower cervical spine and right upper trap region with no hard neuro signs down the right UE. Patient die not demonstrate any balance deficits today in clinic, but describes sudden imbalance that causes her to fall over to the right. Plan to examine this further as appropriate at future visit. Patient presents with significant pain, ROM, joint stiffness, motor control, muscle tension, muscle performance (strength/power/endurance), and activity tolerance impairments that are limiting ability to complete bending over, cleaning, driving, turing head, sleeping, food prep, completing daily activities, sitting,  ambulation, without falls and injury, without difficulty. Patient will benefit from skilled physical therapy intervention to address current body structure impairments and activity limitations to improve function and work towards goals set in current POC in order to return to prior level of function or maximal functional improvement.    OBJECTIVE IMPAIRMENTS: decreased activity tolerance, decreased balance, decreased endurance, decreased knowledge of condition, decreased mobility, difficulty walking, decreased ROM, decreased strength, hypomobility, impaired perceived functional ability, increased muscle spasms, impaired UE functional use, improper body mechanics, and pain.   ACTIVITY LIMITATIONS: lifting, bending, sitting, standing, sleeping, continence, and locomotion level  PARTICIPATION LIMITATIONS: cleaning, laundry, interpersonal relationship, driving, community activity, occupation, and   difficulty bending over, cleaning, driving, turing  head, sleeping, food prep, completing daily activities, sitting,  ambulation, without falls and injury  PERSONAL FACTORS: Past/current experiences, Time since onset of injury/illness/exacerbation, and 3+ comorbidities:   Age-related osteoporosis; Esophageal  dysphagia; Recurrent major depressive disorder, in partial remission (HCC); Hyperlipidemia; Falls frequently; Neck pain; Abnormal gait; Acute otalgia, right; Continuous leakage of urine; and Hearing loss associated with syndrome of both ears on their problem list, low back pain, small intestine surgery, nasal septum surgery, appendectomy, cancer (cervical cancer, tip of cervix removed and that resolved it > 5 years ago), bilateral hand paresthesia, endometriosis, arthritis, anxiety, anemia, cataracts; sciatica, osteoporosis, 28lb weight loss over 3 months (over holidays, states she thinks she was too tired to eat), unexplained dropping things with R UE, continuous urinary incontinence without urge (PCP referred her to neurosurgery) are also affecting patient's functional outcome.   REHAB POTENTIAL: Good  CLINICAL DECISION MAKING: Evolving/moderate complexity  EVALUATION COMPLEXITY: Moderate   GOALS: Goals reviewed with patient? No  SHORT TERM GOALS: Target date: 07/28/2022  Patient will be independent with initial home exercise program for self-management of symptoms. Baseline: Initial HEP provided at IE (07/14/22); Goal status: In-progress   LONG TERM GOALS: Target date: 10/06/2022  Patient will be independent with a long-term home exercise program for self-management of symptoms.  Baseline: Initial HEP provided at IE (07/14/22); Goal status: In-progress  2.  Patient will demonstrate improved FOTO to equal or greater than 56 by visit #10 to demonstrate improvement in overall condition and self-reported functional ability.  Baseline: 46 (07/14/22); Goal status: In-progress  3.  Patient will demonstrate full cervical spine AROM with no increase in pain except intermittent end range discomfort to improve her ability to drive and pick things up from the floor with less difficulty.  Baseline: painful with limitations in some directions - see objective (07/14/22); Goal status:  In-progress  4.  Patient will report decrease in frequency of LOB to the right by equal or 50% to improve her fall risk and safety with weight bearing activities.  Baseline: 1-2 times a month (07/14/22); Goal status: In-progress  5.  Patient will complete community, work and/or recreational activities with 50% less limitation due to current condition.  Baseline: difficulty bending over, cleaning, driving, turing head, sleeping, food prep, completing daily activities, sitting,  ambulation, without falls and injury (07/14/22); Goal status: In-progress  6.  Patient will score equal or greater than 25/30 on the Functional Gait Assessment to demonstrate low fall risk to decrease her chances of injury from fall.  Baseline: to be measured visit 2 as appropriate (07/14/2022);  Goal status: In-progress   PLAN:  PT FREQUENCY: 1-2x/week  PT DURATION: 12 weeks  PLANNED INTERVENTIONS: Therapeutic exercises, Therapeutic activity, Neuromuscular re-education, Balance training, Gait training, Patient/Family education, Self Care, Joint mobilization, Canalith repositioning, DME instructions, Dry Needling, Electrical stimulation, Spinal mobilization, Cryotherapy, Moist heat, Manual therapy, and Re-evaluation.  PLAN FOR NEXT SESSION: update HEP as appropriate, continue examination as appropriate, progressive neck/postural/functional strengthening and ROM exercises as tolerated. Balance/vestibular exercises as appropriate. Education. Manual therapy/dry needling as appropriate.    Cira Rue, PT, DPT 07/19/2022, 10:05 AM   [facility] [address] [place], [place] [address] P: [phone] I F: [phone]

## 2022-07-21 ENCOUNTER — Ambulatory Visit: Payer: Medicare Other | Admitting: Physical Therapy

## 2022-07-21 ENCOUNTER — Other Ambulatory Visit: Payer: Self-pay | Admitting: Family Medicine

## 2022-07-21 ENCOUNTER — Other Ambulatory Visit: Payer: Medicare Other | Admitting: Pharmacist

## 2022-07-21 DIAGNOSIS — F3341 Major depressive disorder, recurrent, in partial remission: Secondary | ICD-10-CM

## 2022-07-21 MED ORDER — ARIPIPRAZOLE 2 MG PO TABS
2.0000 mg | ORAL_TABLET | Freq: Every day | ORAL | 2 refills | Status: DC
Start: 2022-07-21 — End: 2022-09-01

## 2022-07-21 NOTE — Patient Instructions (Addendum)
It was a pleasure speaking with you today.  Please continue to use your weekly pillbox to organize your medications.  You can find more information about your [employer] [place] Medicaid benefits by visiting:   [email or web address]  Estelle Grumbles, PharmD, [facility] [phone]

## 2022-07-21 NOTE — Progress Notes (Signed)
  Chronic Care Management Note  07/21/2022 Name: Shari Rivera MRN: [id number] DOB: 07-20-1955  Shari Rivera is a 67 y.o. year old female who is a primary care patient of Simmons-Robinson, Tawanna Cooler, MD and is actively engaged with the Chronic Care Management team. I reached out to Augusto Gamble by phone today to assist with re-scheduling an initial visit with the RN Case Manager  Follow up plan: Telephone appointment with care management team member scheduled for:07/23/2022  Penne Lash, RMA Care Guide [facility]  [place], [place] [address] Direct Dial: [phone] [email or web address]

## 2022-07-21 NOTE — Progress Notes (Signed)
07/21/2022 Name: Shari Rivera MRN: [id number] DOB: March 10, 1955  Chief Complaint  Patient presents with   Medication Assistance    Shari Rivera is a 67 y.o. year old female who presented for a telephone visit.   They were referred to the pharmacist by their PCP for assistance in managing medication access.    Subjective:  Care Team: Primary Care Provider: Ronnald Ramp, MD ; Next Scheduled Visit: 10/07/2022 Physical Therapist: Cira Rue, PT; Next Scheduled Visit: 07/26/2022 Neurology: Drake Leach, PA-C; Next Scheduled Visit: 07/28/2022  Medication Access/Adherence  Current Pharmacy:  [facility] - [place], [place] - [address] [place] [address] Phone: [phone] Fax: [phone]   Patient reports affordability concerns with their medications: Yes  Patient reports access/transportation concerns to their pharmacy: No  Patient reports adherence concerns with their medications:  No    Reports uses weekly pillbox to organize her medications  Depression:  Current medication: sertraline 100 mg daily  Previous therapies tried: Rexulti (non preferred through patient's Medicaid formulary)  Reports previously took Rexulti from samples from PCP for 1 month and noticed a significant improvement in mood, but stopped when ran out of samples    Objective:  Lab Results  Component Value Date   CREATININE 0.97 06/10/2022   BUN 13 06/10/2022   NA 139 06/10/2022   K 4.6 06/10/2022   CL 105 06/10/2022   CO2 22 06/10/2022    Lab Results  Component Value Date   CHOL 144 03/26/2021   HDL 49 03/26/2021   LDLCALC 79 03/26/2021   TRIG 80 03/26/2021    Medications Reviewed Today     Reviewed by Manuela Neptune, RPH-CPP (Pharmacist) on 07/21/22 at 1523  Med List Status: <None>   Medication Order Taking? Sig Documenting Provider Last Dose Status Informant  alendronate (FOSAMAX) 70 MG tablet [id number] Yes Take 1 tablet (70 mg  total) by mouth once a week. Take with a full glass of water on an empty stomach. Simmons-Robinson, Tawanna Cooler, MD Taking Active            Med Note Ronney Asters, [facility]   Wed Jul 21, 2022  3:12 PM) On Sundays  brexpiprazole (REXULTI) 0.25 MG TABS tablet [id number]  Take 1 tablet (0.25 mg total) by mouth daily. Simmons-Robinson, Makiera, MD  Active   Calcium Carbonate-Vitamin D 600-400 MG-UNIT tablet [id number] Yes Take 1 tablet by mouth daily. [provider] Taking Active Self  Coenzyme Q10 (COQ-10) 100 MG capsule [id number] Yes Take 100 mg by mouth daily. [provider] Taking Active   famotidine (PEPCID) 40 MG tablet [id number] Yes Take 1 tablet (40 mg total) by mouth at bedtime. Bosie Clos, MD Taking Active   fluticasone [facility] [address]) 50 MCG/ACT nasal spray [id number] Yes Place 2 sprays into both nostrils daily. Simmons-Robinson, Makiera, MD Taking Active   gabapentin (NEURONTIN) 100 MG capsule [id number] Yes TAKE 2 CAPSULES BY MOUTH TWICE DAILY. Simmons-Robinson, Makiera, MD Taking Active   Multiple Vitamin (MULTIVITAMIN WITH MINERALS) TABS tablet [id number] Yes Take 1 tablet by mouth daily. [provider] Taking Active Self  naproxen sodium (ALEVE) 220 MG tablet [id number] Yes Take 220 mg by mouth daily as needed. [provider] Taking Active   rosuvastatin (CRESTOR) 20 MG tablet [id number] Yes Take 1 tablet (20 mg total) by mouth daily. Simmons-Robinson, Makiera, MD Taking Active   sertraline (ZOLOFT) 100 MG tablet [id number] Yes TAKE ONE TABLET BY MOUTH EVERY DAY Simmons-Robinson, Makiera, MD Taking Active  Med List Note Neita Garnet, [place], MD 02/12/22 1431):                Assessment/Plan:   Comprehensive medication review performed; medication list updated in electronic medical record - Caution patient for risk of dizziness/sedation with gabapentin, particularly when taken in combination with other medications that can cause CNS depression   Patient admits to recently having some trouble with dizziness/balance  Patient to observe to see if dizziness/balance symptoms change depending on gabapentin use Note patient seen by PCP related to this concern on 5/16. At this visit, provider recommended: - ENT evaluation, including evaluation of hearing loss  Reports scheduled for 7/2 - Referral to Physical Therapy Reports completed initial assessment and now scheduled for twice weekly visits - Referral to Neurosurgery related to neck pain  Scheduled for 7/3 - Note patient has an active prescription for famotidine, but reports has been using OTC form instead as reports was not aware prescription still valid. Counsel patient to follow up with pharmacy to receive famotidine prescription through her Medicaid coverage for cost savings  Provide counseling on falls prevention  Depression: - From review of formulary for patient's [employer] Medicaid coverage, note Rexulti is a non-preferred option. Per formulary, trial of at least preferred medication from the same class is required before approval for non-preferred options. Will collaborate with PCP today regarding therapeutic alternatives to Rexulti that are covered as preferred through patient's Medicaid plan   Follow Up Plan: Clinical Pharmacist will outreach to patient by telephone again within the next 3 days  Estelle Grumbles, PharmD, [facility] [phone]

## 2022-07-22 ENCOUNTER — Telehealth: Payer: Self-pay | Admitting: Family Medicine

## 2022-07-23 ENCOUNTER — Telehealth: Payer: Medicare Other

## 2022-07-23 ENCOUNTER — Other Ambulatory Visit: Payer: Self-pay | Admitting: Pharmacist

## 2022-07-23 NOTE — Progress Notes (Signed)
   07/23/2022  Patient ID: Shari Rivera, female   DOB: April 27, 1955, 67 y.o.   MRN: [id number]  Collaborated with PCP today regarding therapeutic alternatives to Rexulti that are covered as preferred through patient's [employer] Medicare D-SNP plan. Recommend provider consider aripiprazole formulary preferred therapeutic alternative aripiprazole.  - PCP agrees and prescription for aripiprazole 2 mg daily sent to pharmacy - Today note aripiprazole Rx requiring a prior authorization (PA) thorough patient's plan - Submit PA via covermymeds today. PA approved  Follow up with [facility] to request pharmacy reprocess claim for patient today.  Was unable to reach patient via telephone today and have left HIPAA compliant voicemail asking patient to return my call.  Plan: Clinical Pharmacist will attempt to reach patient by telephone again within the next 7 days.  Estelle Grumbles, PharmD, [facility] [phone]

## 2022-07-23 NOTE — Telephone Encounter (Signed)
Prior Authorization completed via covermymeds. PA approved through 07/23/2023.

## 2022-07-25 NOTE — Progress Notes (Unsigned)
Referring Physician:  Ronnald Ramp, MD [address] [place],  [place] [address]  Primary Physician:  Ronnald Ramp, MD  History of Present Illness: 07/28/2022 Shari Rivera has a history of osteoporosis, depression, GERD, hearing loss, hyperlipidemia.   Referred to ENT for hearing loss, ear pain.   3+ year history of intermittent neck pain with limited ROM. She has intermittent right > left sided neck pain that radiates into her shoulders. No pain in her arms, but she has constant tingling in her hands. Pain is worse with looking over her shoulders. Some relief with heat and medications.   No issues with hand dexterity. She does have balance issues- she can be sitting and suddenly fall over to right.   Bowel/Bladder Dysfunction: history of chronic urinary incontinence x 3 years- she does not have sensation to void. No bowel issues.   Conservative measures:  Physical therapy: initial eval for neck pain on 07/14/22- has done 2 sessions total.  Multimodal medical therapy including regular antiinflammatories: gabapentin, aleve Injections: no epidural steroid injections  Past Surgery: No spinal surgery  [name] has symptoms of cervical myelopathy. She has balance issues. No dexterity issues.   The symptoms are causing a significant impact on the patient's life.   Review of Systems:  A 10 point review of systems is negative, except for the pertinent positives and negatives detailed in the HPI.  Past Medical History: Past Medical History:  Diagnosis Date   Allergy    Anemia    Anxiety    Arthritis    Cancer (HCC)    Cataract    Depression    Endometriosis     Past Surgical History: Past Surgical History:  Procedure Laterality Date   APPENDECTOMY     NASAL SEPTUM SURGERY     SMALL INTESTINE SURGERY      Allergies: Allergies as of 07/28/2022 - Review Complete 07/28/2022  Allergen Reaction Noted   Codeine Diarrhea  and Nausea And Vomiting 05/27/2015    Medications: Outpatient Encounter Medications as of 07/28/2022  Medication Sig   alendronate (FOSAMAX) 70 MG tablet Take 1 tablet (70 mg total) by mouth once a week. Take with a full glass of water on an empty stomach.   ARIPiprazole (ABILIFY) 2 MG tablet Take 1 tablet (2 mg total) by mouth daily.   Calcium Carbonate-Vitamin D 600-400 MG-UNIT tablet Take 1 tablet by mouth daily.   Coenzyme Q10 (COQ-10) 100 MG capsule Take 100 mg by mouth daily.   famotidine (PEPCID) 40 MG tablet Take 1 tablet (40 mg total) by mouth at bedtime.   fluticasone (FLONASE) 50 MCG/ACT nasal spray Place 2 sprays into both nostrils daily.   gabapentin (NEURONTIN) 100 MG capsule TAKE 2 CAPSULES BY MOUTH TWICE DAILY.   Multiple Vitamin (MULTIVITAMIN WITH MINERALS) TABS tablet Take 1 tablet by mouth daily.   naproxen sodium (ALEVE) 220 MG tablet Take 220 mg by mouth daily as needed.   rosuvastatin (CRESTOR) 20 MG tablet Take 1 tablet (20 mg total) by mouth daily.   sertraline (ZOLOFT) 100 MG tablet TAKE ONE TABLET BY MOUTH EVERY DAY   No facility-administered encounter medications on file as of 07/28/2022.    Social History: Social History   Tobacco Use   Smoking status: Former   Smokeless tobacco: Never  Building services engineer Use: Former  Substance Use Topics   Alcohol use: No   Drug use: No    Family Medical History: Family History  Problem Relation  Age of Onset   Cancer Mother    Stroke Mother    COPD Mother    Heart failure Mother    Atrial fibrillation Mother    Heart disease Father    Cancer Maternal Grandmother    Diabetes Maternal Grandmother    Kidney disease Maternal Grandfather    Heart disease Maternal Grandfather    Heart disease Paternal Grandfather    Breast cancer Other        mggm    Physical Examination: Vitals:   07/28/22 1404  BP: 130/72    General: Patient is well developed, well nourished, calm, collected, and in no apparent distress.  Attention to examination is appropriate.  Respiratory: Patient is breathing without any difficulty.   NEUROLOGICAL:     Awake, alert, oriented to person, place, and time.  Speech is clear and fluent. Fund of knowledge is appropriate.   Cranial Nerves: Pupils equal round and reactive to light.  Facial tone is symmetric.    No posterior cervical tenderness. No tenderness in bilateral trapezial region.   No abnormal lesions on exposed skin.   Strength: Side Biceps Triceps Deltoid Interossei Grip Wrist Ext. Wrist Flex.  R 5 5 5 5 5 5 5   L 5 5 5 5 5 5 5    Side Iliopsoas Quads Hamstring PF DF EHL  R 5 5 5 5 5 5   L 5 5 5 5 5 5    Reflexes are 2+ and symmetric at the biceps, brachioradialis, patella and achilles.   Hoffman's is absent.  Clonus is not present.   Bilateral upper and lower extremity sensation is intact to light touch.     Gait is slow.   Medical Decision Making  Imaging: Cervical xrays dated 03/25/21:  FINDINGS: On the lateral view the cervical spine is visualized to the level of C7. Grade 1 anterolisthesis of C3 on C4 and C4 on C5. Of the normal cervical lordosis. Alignment is otherwise normal.   Dens is well positioned between the lateral masses of C1. There is limited evaluation of the dens for acute fracture on the open-mouth view due to overlying osseous structures.   Multilevel degenerative changes of the spine most prominent at the C5-C7 levels. Associated at least moderate osseous neural foraminal stenosis at the C5-C6 and C6-C7 levels. No acute displaced fracture is detected.No aggressive-appearing focal osseous lesions.   Pre-vertebral soft tissues are within normal limits.   IMPRESSION: 1. No acute displaced fracture or traumatic listhesis of the cervical spine. 2. 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 Limited evaluation due to overlapping osseous structures and  overlying soft tissues.     Electronically Signed   By: Tish Frederickson M.D.   On: 03/26/2021 20:48  I have personally reviewed the images and agree with the above interpretation.  Assessment and Plan: [name] is a pleasant 67 y.o. female has 3+ year history of intermittent neck pain with limited ROM. She has intermittent right > left sided neck pain that radiates into her shoulders. No pain in her arms, but she has constant tingling in her hands.   No issues with hand dexterity. She does have balance issues- she can be sitting and suddenly fall over to right.   She has known cervical spondylosis and DDD C5-C7.   Of note, her sister has MS and she is concerned about her symptoms being similar MS.   Treatment options discussed with patient and  following plan made:   - MRI of cervical spine to further evaluate chronic neck pain and balance issues. No dexterity issues.  - Referral to neurology at [facility] for EMG/NCS of bilateral upper extremities to evaluate numbness/tingling in hands. She will let us know when this has been completed.  - She has chronic urinary incontinence x 3 years. Do not think this is lumbar mediated (no weakness or numbness in lower extremities). Consider referral to urology- will defer to PCP.  - Will schedule follow up visit with me to review MRI results once I get them back.   I spent a total of 30 minutes in face-to-face and non-face-to-face activities related to this patient's care today including review of outside records, review of imaging, review of symptoms, physical exam, discussion of differential diagnosis, discussion of treatment options, and documentation.   Thank you for involving me in the care of this patient.   Drake Leach PA-C Dept. of Neurosurgery

## 2022-07-26 ENCOUNTER — Other Ambulatory Visit: Payer: Self-pay | Admitting: Pharmacist

## 2022-07-26 ENCOUNTER — Telehealth: Payer: Self-pay | Admitting: Family Medicine

## 2022-07-26 ENCOUNTER — Ambulatory Visit: Payer: Medicare Other | Attending: Student in an Organized Health Care Education/Training Program

## 2022-07-26 DIAGNOSIS — R2681 Unsteadiness on feet: Secondary | ICD-10-CM | POA: Insufficient documentation

## 2022-07-26 DIAGNOSIS — Z9181 History of falling: Secondary | ICD-10-CM | POA: Diagnosis not present

## 2022-07-26 DIAGNOSIS — M542 Cervicalgia: Secondary | ICD-10-CM | POA: Diagnosis not present

## 2022-07-26 NOTE — Telephone Encounter (Signed)
Covermymeds is requesting prior authorization Key: [id number] Name: Perrette ARIPiprazole 2mg  tablets

## 2022-07-26 NOTE — Patient Instructions (Signed)
Goals Addressed             This Visit's Progress    Pharmacy Goals       Please continue to use your weekly pillbox to organize your medications.   You can find more information about your [employer] [place] Medicaid benefits by visiting:    [email or web address]   Estelle Grumbles, PharmD, [facility] [phone]

## 2022-07-26 NOTE — Therapy (Signed)
OUTPATIENT PHYSICAL THERAPY TREATMENT   Patient Name: Shari Rivera MRN: [id number] DOB:1955-04-17, 67 y.o., female Today's Date: 07/26/2022  END OF SESSION:  PT End of Session - 07/26/22 1657     Visit Number 2    Number of Visits 13    Date for PT Re-Evaluation 10/06/22    Authorization Type [employer] reporting period from 07/14/2022    Authorization Time Period VL:30 PT/OT per cal yr-30 remain    Authorization - Visit Number 2    Authorization - Number of Visits 30    Progress Note Due on Visit 10    PT Start Time 1650    PT Stop Time 1728    PT Time Calculation (min) 38 min    Activity Tolerance Patient tolerated treatment well;No increased pain    Behavior During Therapy [facility] for tasks assessed/performed               Patient Active Problem List   Diagnosis Date Noted   Falls frequently 06/10/2022   Neck pain 06/10/2022   Abnormal gait 06/10/2022   Acute otalgia, right 06/10/2022   Continuous leakage of urine 06/10/2022   Hearing loss associated with syndrome of both ears 06/10/2022   Esophageal dysphagia 02/12/2022   Recurrent major depressive disorder, in partial remission (HCC) 02/12/2022   Hyperlipidemia 02/12/2022   Age-related osteoporosis without current pathological fracture 02/11/2022    PCP: Ronnald Ramp, MD  REFERRING PROVIDER: Ronnald Ramp, MD  REFERRING DIAG: falls frequently, neck pain, abnormal gait  Rationale for Evaluation and Treatment: Rehabilitation  THERAPY DIAG:  Cervicalgia  Unsteadiness on feet  History of falling  ONSET DATE: neck pain started Easter 2 years ago, balance problems started about a year prior to PT eval  SUBJECTIVE:                                                                                                                                                                                           SUBJECTIVE STATEMENT: Patient doing ok today, pain around 2/10. Tried HEP  with success. No resulting soreness. Pt's pain best controlled when not trying to move in strange directions.   PERTINENT HISTORY:  Patient is a 67 y.o. female who presents to outpatient physical therapy with a referral for medical diagnosis falls frequently, neck pain, abnormal gait. This patient's chief complaints consist of chronic neck pain and sudden LOB to the right resulting in falls or near falls leading to the following functional deficits: difficulty bending over, cleaning, driving, turing head, sleeping, food prep, completing daily activities, sitting,  ambulation, without falls and injury. Relevant past medical history and comorbidities include  has Age-related osteoporosis; Esophageal dysphagia; Recurrent major depressive disorder, in partial remission (HCC); Hyperlipidemia; Falls frequently; Neck pain; Abnormal gait; Acute otalgia, right; Continuous leakage of urine; and Hearing loss associated with syndrome of both ears on their problem list, low back pain, small intestine surgery, nasal septum surgery, appendectomy, cancer (cervical cancer, tip of cervix removed and that resolved it > 5 years ago), bilateral hand paresthesia, endometriosis, arthritis, anxiety, anemia, cataracts; sciatica, osteoporosis, 28lb weight loss over 3 months (over holidays, states she thinks she was too tired to eat), unexplained dropping things with R UE, continuous urinary incontinence without urge (PCP referred her to neurosurgery). Patient denies hx of stroke, seizures, lung problems, heart problems, and spinal surgery. She states she went from 136-108# from Halloween to Jan 1. She thinks maybe she was so tired she was not eating. She has recovered her weight to about 120# now. She does endorse dropping things with her right hand a lot. She also has urinary incontinence where she is leaking without knowing it. She states she uses poise pads. PCP aware of incontinence and has referred pt to neurosurgery.   PAIN:  Are  you having pain? Yes: NPRS scale: 2/10 right lateral neck    PRECAUTIONS: Fall  WEIGHT BEARING RESTRICTIONS: No  FALLS:  Has patient fallen in last 6 months? Yes. Number of falls 6 close falls, but did not fall all the way down   PATIENT GOALS: "just trying to get what my doctor wants me to do" "to get everything like it's supposed to be back"  OBJECTIVE  TODAY'S TREATMENT:  - supine on plinth, hot pack at neck x 3 sec - cervical retraction isometrics 10x5secH  - elbows into plinth shoulder extension isometrics 10x5secH - Pin and stretch: Right UT paired with passive extension Rt shoulder from 90 to zero, then left cervical rotation neutral to 45 (20x each) (stil in supine)  -wand flexion in supine 1x15 -supine chin tuck and lift 1x15 -cervical rotation ROM 1x20, alternating sides, slight flexion  -supine chin tuck and lift 1x15 -supine bilat LUE flexion 2x12 c 2lb FW (to 90 degrees)    Pt required multimodal cuing for proper technique and to facilitate improved neuromuscular control, strength, range of motion, and functional ability resulting in improved performance and form.   PATIENT EDUCATION:  Education details: Exercise purpose/form. Self management techniques. Education on diagnosis, prognosis, POC, anatomy and physiology of current condition. Education on HEP including handout. Reviewed cancelation/no-show policy with patient and confirmed patient has correct phone number for clinic; patient verbalized understanding (07/14/22). Person educated: Patient Education method: Explanation, Demonstration, Tactile cues, Verbal cues, and Handouts Education comprehension: verbalized understanding, returned demonstration, and needs further education  HOME EXERCISE PROGRAM: Access Code: [email or web address] URL: [email or web address] Date: 07/14/2022 Prepared by: Norton Blizzard  Exercises - Supine Deep Neck Flexor Nods  - 1 x daily - 2 sets - 10 reps - 5 seconds hold - Seated  Cervical Retraction  - 1-2 x daily - 2 sets - 10 reps  ASSESSMENT:  CLINICAL IMPRESSION: Pt tolerates session well, focus on gentle soft tissue release and A/ROM cervical spine. Cues for improving/reducing forward head posture tendencies. Patient will benefit from skilled physical therapy intervention to address current body structure impairments and activity limitations to improve function and work towards goals set in current POC in order to return to prior level of function or maximal functional improvement.    OBJECTIVE IMPAIRMENTS: decreased activity tolerance, decreased balance, decreased endurance, decreased knowledge of condition, decreased mobility,  difficulty walking, decreased ROM, decreased strength, hypomobility, impaired perceived functional ability, increased muscle spasms, impaired UE functional use, improper body mechanics, and pain.   ACTIVITY LIMITATIONS: lifting, bending, sitting, standing, sleeping, continence, and locomotion level  PARTICIPATION LIMITATIONS: cleaning, laundry, interpersonal relationship, driving, community activity, occupation, and   difficulty bending over, cleaning, driving, turing head, sleeping, food prep, completing daily activities, sitting,  ambulation, without falls and injury  PERSONAL FACTORS: Past/current experiences, Time since onset of injury/illness/exacerbation, and 3+ comorbidities:   Age-related osteoporosis; Esophageal dysphagia; Recurrent major depressive disorder, in partial remission (HCC); Hyperlipidemia; Falls frequently; Neck pain; Abnormal gait; Acute otalgia, right; Continuous leakage of urine; and Hearing loss associated with syndrome of both ears on their problem list, low back pain, small intestine surgery, nasal septum surgery, appendectomy, cancer (cervical cancer, tip of cervix removed and that resolved it > 5 years ago), bilateral hand paresthesia, endometriosis, arthritis, anxiety, anemia, cataracts; sciatica, osteoporosis, 28lb  weight loss over 3 months (over holidays, states she thinks she was too tired to eat), unexplained dropping things with R UE, continuous urinary incontinence without urge (PCP referred her to neurosurgery) are also affecting patient's functional outcome.   REHAB POTENTIAL: Good  CLINICAL DECISION MAKING: Evolving/moderate complexity  EVALUATION COMPLEXITY: Moderate   GOALS: Goals reviewed with patient? No  SHORT TERM GOALS: Target date: 07/28/2022  Patient will be independent with initial home exercise program for self-management of symptoms. Baseline: Initial HEP provided at IE (07/14/22); Goal status: INITIAL   LONG TERM GOALS: Target date: 10/06/2022  Patient will be independent with a long-term home exercise program for self-management of symptoms.  Baseline: Initial HEP provided at IE (07/14/22); Goal status: INITIAL  2.  Patient will demonstrate improved FOTO to equal or greater than 56 by visit #10 to demonstrate improvement in overall condition and self-reported functional ability.  Baseline: 46 (07/14/22); Goal status: INITIAL  3.  Patient will demonstrate full cervical spine AROM with no increase in pain except intermittent end range discomfort to improve her ability to drive and pick things up from the floor with less difficulty.  Baseline: painful with limitations in some directions - see objective (07/14/22); Goal status: INITIAL  4.  Patient will report decrease in frequency of LOB to the right by equal or 50% to improve her fall risk and safety with weight bearing activities.  Baseline: 1-2 times a month (07/14/22); Goal status: INITIAL  5.  Patient will complete community, work and/or recreational activities with 50% less limitation due to current condition.  Baseline: difficulty bending over, cleaning, driving, turing head, sleeping, food prep, completing daily activities, sitting,  ambulation, without falls and injury (07/14/22); Goal status: INITIAL  6.   Patient will score equal or greater than 25/30 on the Functional Gait Assessment to demonstrate low fall risk to decrease her chances of injury from fall.  Baseline: to be measured visit 2 as appropriate (07/14/2022);  Goal status: INITIAL   PLAN:  PT FREQUENCY: 1-2x/week  PT DURATION: 12 weeks  PLANNED INTERVENTIONS: Therapeutic exercises, Therapeutic activity, Neuromuscular re-education, Balance training, Gait training, Patient/Family education, Self Care, Joint mobilization, Canalith repositioning, DME instructions, Dry Needling, Electrical stimulation, Spinal mobilization, Cryotherapy, Moist heat, Manual therapy, and Re-evaluation.  PLAN FOR NEXT SESSION: update HEP as appropriate, continue examination as appropriate, progressive neck/postural/functional strengthening and ROM exercises as tolerated. Balance/vestibular exercises as appropriate. Education. Manual therapy/dry needling as appropriate.    Dioselina Brumbaugh C, PT, DPT 07/26/2022, 4:58 PM   5:16 PM, 07/26/22 Rosamaria Lints, PT, DPT Physical  Therapist - [place] [phone] (Office)   [facility] [address] [place], [place] [address] P: [phone] I F: [phone]

## 2022-07-26 NOTE — Progress Notes (Signed)
   07/26/2022  Patient ID: Shari Rivera, female   DOB: 07-25-1955, 67 y.o.   MRN: [id number]  Receive a call back from patient today.  Note patient previously noticed significant improvement in her mood while taking samples of Rexulti (in addition to her sertraline 100 mg daily) from PCP. However, for patient's [employer] Medicaid coverage, Rexulti is a non-preferred option. As requested, collaborated with PCP regarding covered therapeutic alternatives to Rexulti.   Provide patient an update. Let her know that prior authorization for aripiprazole 2 mg daily prescription from PCP has now been approved by her insurance and medication ready for pick up at pharmacy. Provide patient with counseling on aripiprazole.  Patient states will follow up with pharmacy to pick up medication. Requests follow up in 1 month.  Plan: Clinical Pharmacist will follow up with patient by telephone again on 09/01/2022 at 8:30 AM   Estelle Grumbles, PharmD, [facility] [phone]

## 2022-07-27 ENCOUNTER — Telehealth: Payer: Self-pay

## 2022-07-27 ENCOUNTER — Other Ambulatory Visit: Payer: Self-pay | Admitting: Family Medicine

## 2022-07-27 DIAGNOSIS — R1319 Other dysphagia: Secondary | ICD-10-CM

## 2022-07-27 DIAGNOSIS — H9193 Unspecified hearing loss, bilateral: Secondary | ICD-10-CM

## 2022-07-27 NOTE — Telephone Encounter (Signed)
Copied from CRM [id number]. Topic: Referral - Request for Referral >> Jul 27, 2022  2:24 PM Marlow Baars wrote: Has patient seen PCP for this complaint? Yes.   Referral for which specialty: ENT Preferred provider/office: [facility] in [place], [place] Reason for referral: Swallowing and hearing issues  She states the previous referred provider does not take her [facility] and this is why she is requesting a new referral. She states this provider office she is requesting does accept her insurance. Please assist patient further with any of the providers at that location that has the soonest appt.

## 2022-07-27 NOTE — Telephone Encounter (Signed)
Referral has been placed for ENT  [facility] in [place], [place] Reason for referral: Swallowing and hearing issues  Ronnald Ramp, MD  [facility]

## 2022-07-27 NOTE — Telephone Encounter (Signed)
Patient was advised.  

## 2022-07-28 ENCOUNTER — Ambulatory Visit: Payer: Medicare Other

## 2022-07-28 ENCOUNTER — Encounter: Payer: Self-pay | Admitting: Orthopedic Surgery

## 2022-07-28 ENCOUNTER — Telehealth: Payer: Self-pay | Admitting: Orthopedic Surgery

## 2022-07-28 ENCOUNTER — Ambulatory Visit (INDEPENDENT_AMBULATORY_CARE_PROVIDER_SITE_OTHER): Payer: Medicare Other | Admitting: Orthopedic Surgery

## 2022-07-28 VITALS — BP 130/72 | Ht 62.0 in | Wt 125.0 lb

## 2022-07-28 DIAGNOSIS — M50322 Other cervical disc degeneration at C5-C6 level: Secondary | ICD-10-CM

## 2022-07-28 DIAGNOSIS — M542 Cervicalgia: Secondary | ICD-10-CM

## 2022-07-28 DIAGNOSIS — M50323 Other cervical disc degeneration at C6-C7 level: Secondary | ICD-10-CM

## 2022-07-28 DIAGNOSIS — Z9181 History of falling: Secondary | ICD-10-CM

## 2022-07-28 DIAGNOSIS — M47812 Spondylosis without myelopathy or radiculopathy, cervical region: Secondary | ICD-10-CM | POA: Diagnosis not present

## 2022-07-28 DIAGNOSIS — R2681 Unsteadiness on feet: Secondary | ICD-10-CM | POA: Diagnosis not present

## 2022-07-28 DIAGNOSIS — R202 Paresthesia of skin: Secondary | ICD-10-CM | POA: Diagnosis not present

## 2022-07-28 DIAGNOSIS — M5030 Other cervical disc degeneration, unspecified cervical region: Secondary | ICD-10-CM

## 2022-07-28 DIAGNOSIS — R200 Anesthesia of skin: Secondary | ICD-10-CM

## 2022-07-28 NOTE — Patient Instructions (Signed)
It was so nice to see you today. Thank you so much for coming in.    You previous neck xrays show some wear and tear (arthritis). This may be causing your pain.   I want to get an MRI of your neck to look into things further. We will get this approved through your insurance and [facility] will call you to schedule the appointment.   [facility] (building with the white pillars)is located off of [address]. The address is [address], [place], [place] [address].   I want to get an EMG (nerve conduction test) to look into things further. I have ordered this and neurology at [facility] will call you to schedule it. If you don't hear from them, you can call them at [phone].   Please let me know when you have completed the nerve test so we can get the results.   After your MRI is done, it takes 5-7 days for me to get the results. Once I have them, we will call you to schedule a follow up with me to review them.   Please do not hesitate to call if you have any questions or concerns. You can also message me in MyChart.   If you have not heard back about the MRI or nerve teste in the next week, please call the office so we can help you get them scheduled.   Drake Leach PA-C [phone]

## 2022-07-28 NOTE — Telephone Encounter (Signed)
Emg/NSC ordered of bilateral upper extremities to be done at [facility] neurology.

## 2022-07-28 NOTE — Therapy (Signed)
OUTPATIENT PHYSICAL THERAPY TREATMENT   Patient Name: Mariavictoria Zika MRN: [id number] DOB:[birth year], 67 y.o., female Today's Date: 07/28/2022  END OF SESSION:  PT End of Session - 07/28/22 1651     Visit Number 3    Number of Visits 13    Date for PT Re-Evaluation 10/06/22    Authorization Type [employer] reporting period from 07/14/2022    Authorization Time Period VL:30 PT/OT per cal yr-30 remain    Authorization - Visit Number 3    Authorization - Number of Visits 30    Progress Note Due on Visit 10    PT Start Time 1645    PT Stop Time 1725    PT Time Calculation (min) 40 min    Activity Tolerance Patient tolerated treatment well;No increased pain    Behavior During Therapy [facility] for tasks assessed/performed               Patient Active Problem List   Diagnosis Date Noted   Falls frequently 06/10/2022   Neck pain 06/10/2022   Abnormal gait 06/10/2022   Acute otalgia, right 06/10/2022   Continuous leakage of urine 06/10/2022   Hearing loss associated with syndrome of both ears 06/10/2022   Esophageal dysphagia 02/12/2022   Recurrent major depressive disorder, in partial remission (HCC) 02/12/2022   Hyperlipidemia 02/12/2022   Age-related osteoporosis without current pathological fracture 02/11/2022    PCP: Ronnald Ramp, MD  REFERRING PROVIDER: Ronnald Ramp, MD  REFERRING DIAG: falls frequently, neck pain, abnormal gait  Rationale for Evaluation and Treatment: Rehabilitation  THERAPY DIAG:  Cervicalgia  Unsteadiness on feet  History of falling  ONSET DATE: neck pain started Easter 2 years ago, balance problems started about a year prior to PT eval  SUBJECTIVE:                                                                                                                                                                                           SUBJECTIVE STATEMENT: Patient doing ok today, pain around 2/10. She arrives  with a mask on due to a new sore throat. Still HEPing with success. Pt had some soreness in muscles after session, but no big deal. Pt's pain best controlled when not trying to move in strange directions.   PERTINENT HISTORY:  Patient is a 67 y.o. female who presents to outpatient physical therapy with a referral for medical diagnosis falls frequently, neck pain, abnormal gait. This patient's chief complaints consist of chronic neck pain and sudden LOB to the right resulting in falls or near falls leading to the following functional deficits: difficulty bending over, cleaning, driving, turing  head, sleeping, food prep, completing daily activities, sitting,  ambulation, without falls and injury. Relevant past medical history and comorbidities include has Age-related osteoporosis; Esophageal dysphagia; Recurrent major depressive disorder, in partial remission (HCC); Hyperlipidemia; Falls frequently; Neck pain; Abnormal gait; Acute otalgia, right; Continuous leakage of urine; and Hearing loss associated with syndrome of both ears on their problem list, low back pain, small intestine surgery, nasal septum surgery, appendectomy, cancer (cervical cancer, tip of cervix removed and that resolved it > 5 years ago), bilateral hand paresthesia, endometriosis, arthritis, anxiety, anemia, cataracts; sciatica, osteoporosis, 28lb weight loss over 3 months (over holidays, states she thinks she was too tired to eat), unexplained dropping things with R UE, continuous urinary incontinence without urge (PCP referred her to neurosurgery). Patient denies hx of stroke, seizures, lung problems, heart problems, and spinal surgery. She states she went from 136-108# from Halloween to Jan 1. She thinks maybe she was so tired she was not eating. She has recovered her weight to about 120# now. She does endorse dropping things with her right hand a lot. She also has urinary incontinence where she is leaking without knowing it. She states she  uses poise pads. PCP aware of incontinence and has referred pt to neurosurgery.   PAIN:  Are you having pain? Yes: NPRS scale: 2/10 right lateral neck    PRECAUTIONS: Fall  WEIGHT BEARING RESTRICTIONS: No  FALLS:  Has patient fallen in last 6 months? Yes. Number of falls 6 close falls, but did not fall all the way down   PATIENT GOALS: "just trying to get what my doctor wants me to do" "to get everything like it's supposed to be back"  OBJECTIVE  TODAY'S TREATMENT:  -UBE 4 minutes, seat 4 level 2, alterantes angular gyration Q 60sec  -Standing vitals:   60sec 123/58, 75 BPM; 127/61 mmHg 79 BPM  -additional balance screening: narrow stance eyes open x30 sec, eyes closed x15, -narrow stance horzontal head turns x5 each, no LOB (whole body strategy) -narrow stance vertical head turns x5, exclusive cervical extension -SLS: <15sec bilat, no assist needed recovery after LOB  -full tandem 2x20sec bilat, lookin pretty good   -discussion about dissociating thoracic and cervical movement  -seated repeat extension over chair back (green) seated on 2 foam pads, feet on 3" step x15 cues to maintain neutral capital/cervical angles -extension over chair x15 with physioball flexion (a little distracting and awkward) -extension over chair back with sled handle bar at manubrium (olympic clean end position)   -seated wide row 15lb 1x12 (seated on airex pad) -seated chest press 10lb 1x12 (cues for neutral chin, seated on airex pad) -seated wide row 15lb 1x12 (seated on airex pad) -seated chest press 10lb 1x12 (cues for neutral chin, seated on airex pad)  Pt required multimodal cuing for proper technique and to facilitate improved neuromuscular control, strength, range of motion, and functional ability resulting in improved performance and form.   PATIENT EDUCATION:  Education details: Exercise purpose/form. Self management techniques. Education on diagnosis, prognosis, POC, anatomy and  physiology of current condition. Education on HEP including handout. Reviewed cancelation/no-show policy with patient and confirmed patient has correct phone number for clinic; patient verbalized understanding (07/14/22). Person educated: Patient Education method: Explanation, Demonstration, Tactile cues, Verbal cues, and Handouts Education comprehension: verbalized understanding, returned demonstration, and needs further education  HOME EXERCISE PROGRAM: Access Code: [email or web address] URL: [email or web address] Date: 07/14/2022 Prepared by: Norton Blizzard  Exercises - Supine Deep Neck Flexor Nods  - 1 x daily -  2 sets - 10 reps - 5 seconds hold - Seated Cervical Retraction  - 1-2 x daily - 2 sets - 10 reps  ASSESSMENT:  CLINICAL IMPRESSION: Introduced UBE today, mild capital extension tendencies, good tolerance. Screened balance again, different systems included, no significant abnormalities to explain falling episodes. Discussed regional interdependence and her excessive cervical extension mobility that includes lack of paired thoracic, trunk movements. Pt does well with retraining interventions, then joint mobility is advanced in T spine as well. Patient will benefit from skilled physical therapy intervention to address current body structure impairments and activity limitations to improve function and work towards goals set in current POC in order to return to prior level of function or maximal functional improvement.    OBJECTIVE IMPAIRMENTS: decreased activity tolerance, decreased balance, decreased endurance, decreased knowledge of condition, decreased mobility, difficulty walking, decreased ROM, decreased strength, hypomobility, impaired perceived functional ability, increased muscle spasms, impaired UE functional use, improper body mechanics, and pain.   ACTIVITY LIMITATIONS: lifting, bending, sitting, standing, sleeping, continence, and locomotion level  PARTICIPATION  LIMITATIONS: cleaning, laundry, interpersonal relationship, driving, community activity, occupation, and   difficulty bending over, cleaning, driving, turing head, sleeping, food prep, completing daily activities, sitting,  ambulation, without falls and injury  PERSONAL FACTORS: Past/current experiences, Time since onset of injury/illness/exacerbation, and 3+ comorbidities:   Age-related osteoporosis; Esophageal dysphagia; Recurrent major depressive disorder, in partial remission (HCC); Hyperlipidemia; Falls frequently; Neck pain; Abnormal gait; Acute otalgia, right; Continuous leakage of urine; and Hearing loss associated with syndrome of both ears on their problem list, low back pain, small intestine surgery, nasal septum surgery, appendectomy, cancer (cervical cancer, tip of cervix removed and that resolved it > 5 years ago), bilateral hand paresthesia, endometriosis, arthritis, anxiety, anemia, cataracts; sciatica, osteoporosis, 28lb weight loss over 3 months (over holidays, states she thinks she was too tired to eat), unexplained dropping things with R UE, continuous urinary incontinence without urge (PCP referred her to neurosurgery) are also affecting patient's functional outcome.   REHAB POTENTIAL: Good  CLINICAL DECISION MAKING: Evolving/moderate complexity  EVALUATION COMPLEXITY: Moderate   GOALS: Goals reviewed with patient? No  SHORT TERM GOALS: Target date: 07/28/2022  Patient will be independent with initial home exercise program for self-management of symptoms. Baseline: Initial HEP provided at IE (07/14/22); Goal status: INITIAL   LONG TERM GOALS: Target date: 10/06/2022  Patient will be independent with a long-term home exercise program for self-management of symptoms.  Baseline: Initial HEP provided at IE (07/14/22); Goal status: INITIAL  2.  Patient will demonstrate improved FOTO to equal or greater than 56 by visit #10 to demonstrate improvement in overall condition and  self-reported functional ability.  Baseline: 46 (07/14/22); Goal status: INITIAL  3.  Patient will demonstrate full cervical spine AROM with no increase in pain except intermittent end range discomfort to improve her ability to drive and pick things up from the floor with less difficulty.  Baseline: painful with limitations in some directions - see objective (07/14/22); Goal status: INITIAL  4.  Patient will report decrease in frequency of LOB to the right by equal or 50% to improve her fall risk and safety with weight bearing activities.  Baseline: 1-2 times a month (07/14/22); Goal status: INITIAL  5.  Patient will complete community, work and/or recreational activities with 50% less limitation due to current condition.  Baseline: difficulty bending over, cleaning, driving, turing head, sleeping, food prep, completing daily activities, sitting,  ambulation, without falls and injury (07/14/22); Goal status: INITIAL  6.  Patient will score equal or greater than 25/30 on the Functional Gait Assessment to demonstrate low fall risk to decrease her chances of injury from fall.  Baseline: to be measured visit 2 as appropriate (07/14/2022);  Goal status: INITIAL   PLAN:  PT FREQUENCY: 1-2x/week  PT DURATION: 12 weeks  PLANNED INTERVENTIONS: Therapeutic exercises, Therapeutic activity, Neuromuscular re-education, Balance training, Gait training, Patient/Family education, Self Care, Joint mobilization, Canalith repositioning, DME instructions, Dry Needling, Electrical stimulation, Spinal mobilization, Cryotherapy, Moist heat, Manual therapy, and Re-evaluation.  PLAN FOR NEXT SESSION: update HEP as appropriate, continue examination as appropriate, progressive neck/postural/functional strengthening and ROM exercises as tolerated. Balance/vestibular exercises as appropriate. Education. Manual therapy/dry needling as appropriate.    Belen Pesch C, PT, DPT 07/28/2022, 4:52 PM   4:52 PM,  07/28/22 Rosamaria Lints, PT, DPT Physical Therapist - [place] [phone] (Office)   [facility] Physical & Sports Rehab [address] [place], [place] [address] P: [phone] I F: [phone]

## 2022-08-02 ENCOUNTER — Ambulatory Visit: Payer: Medicare Other | Admitting: Physical Therapy

## 2022-08-02 NOTE — Telephone Encounter (Signed)
Referral faxed to [facility]. 

## 2022-08-04 ENCOUNTER — Ambulatory Visit: Payer: Medicare Other | Admitting: Physical Therapy

## 2022-08-09 ENCOUNTER — Telehealth: Payer: Self-pay | Admitting: Physical Therapy

## 2022-08-09 ENCOUNTER — Ambulatory Visit: Payer: Medicare Other | Admitting: Physical Therapy

## 2022-08-09 NOTE — Telephone Encounter (Signed)
Called patient when she did not show up for her PT appointment scheduled at 6:15pm today. Left VM notifying patient of missed visit and requesting a call back to reschedule or confirm her next appointment.   Shari Rivera. Ilsa Iha, PT, DPT 08/09/22, 6:28 PM  [facility] Physical & Sports Rehab [address] [place], [place] [address] P: [phone] I F: [phone]

## 2022-08-11 ENCOUNTER — Ambulatory Visit: Payer: Medicare Other | Admitting: Physical Therapy

## 2022-08-11 ENCOUNTER — Telehealth: Payer: Self-pay | Admitting: Physical Therapy

## 2022-08-11 NOTE — Telephone Encounter (Signed)
Called patient when she did not show up for her PT appointment scheduled at 4:45pm today. Left VM notifying her of the missed visit and that we are now taking her appointments of the schedule per our no-show cancellation policy. Invited her to call back if she would like to get back on the schedule.   Asked support staff to remove her future visits from the schedule per our cancellation policy.   Luretha Murphy. Ilsa Iha, PT, DPT 08/11/22, 5:14 PM  [facility] Physical & Sports Rehab [address] [place], [place] [address] P: [phone] I F: [phone]

## 2022-08-16 ENCOUNTER — Other Ambulatory Visit: Payer: Self-pay | Admitting: Family Medicine

## 2022-08-17 ENCOUNTER — Encounter: Payer: Medicare Other | Admitting: Physical Therapy

## 2022-08-17 NOTE — Telephone Encounter (Signed)
Requested medications are due for refill today.  yes  Requested medications are on the active medications list.  yes  Last refill. 02/11/2022 #12 1 rf  Future visit scheduled.   yes  Notes to clinic.  Missing labs.    Requested Prescriptions  Pending Prescriptions Disp Refills   alendronate (FOSAMAX) 70 MG tablet [Pharmacy Med Name: ALENDRONATE SODIUM 70 MG TAB] 12 tablet 1    Sig: TAKE 1 TABLET EVERY 7 DAYS WITH A FULL GLASS OF WATER ON AN EMPTY STOMACH DO NOT LIE DOWN FOR AT LEAST 30 MIN     Endocrinology:  Bisphosphonates Failed - 08/16/2022  8:23 AM      Failed - Vitamin D in normal range and within 360 days    No results found for: "ZO1096EA5", "WU9811BJ4", "VD125OH2TOT", "25OHVITD3", "25OHVITD2", "25OHVITD1", "VD25OH"       Failed - Mg Level in normal range and within 360 days    No results found for: "MG"       Failed - Phosphate in normal range and within 360 days    No results found for: "PHOS"       Passed - Ca in normal range and within 360 days    Calcium  Date Value Ref Range Status  06/10/2022 9.4 8.7 - 10.3 mg/dL Final         Passed - Cr in normal range and within 360 days    Creatinine, Ser  Date Value Ref Range Status  06/10/2022 0.97 0.57 - 1.00 mg/dL Final         Passed - eGFR is 30 or above and within 360 days    GFR calc Af Amer  Date Value Ref Range Status  10/05/2017 >60 >60 mL/min Final    Comment:    (NOTE) The eGFR has been calculated using the CKD EPI equation. This calculation has not been validated in all clinical situations. eGFR's persistently <60 mL/min signify possible Chronic Kidney Disease.    GFR calc non Af Amer  Date Value Ref Range Status  10/05/2017 >60 >60 mL/min Final   eGFR  Date Value Ref Range Status  06/10/2022 64 >59 mL/min/1.73 Final         Passed - Valid encounter within last 12 months    Recent Outpatient Visits           1 month ago Recurrent major depressive disorder, in partial remission (HCC)    [facility] Simmons-Robinson, [place], MD   2 months ago Falls frequently   [place] [facility] Simmons-Robinson, [place], MD   5 months ago Age-related osteoporosis without current pathological fracture   [place] [facility] Simmons-Robinson, [place], MD   6 months ago Age-related osteoporosis without current pathological fracture   [place] [facility] Ronnald Ramp, MD   1 year ago Neck pain   [facility] Bosie Clos, MD       Future Appointments             In 1 month Simmons-Robinson, Tawanna Cooler, MD [facility]            Passed - Bone Mineral Density or Dexa Scan completed in the last 2 years

## 2022-08-19 ENCOUNTER — Encounter: Payer: Medicare Other | Admitting: Physical Therapy

## 2022-08-19 NOTE — Telephone Encounter (Signed)
Per receptionist they have tried to schedule the patient but she has cancelled several appts and no showed so they might not see her.

## 2022-08-24 ENCOUNTER — Encounter: Payer: Medicare Other | Admitting: Physical Therapy

## 2022-08-26 ENCOUNTER — Encounter: Payer: Medicare Other | Admitting: Physical Therapy

## 2022-08-28 ENCOUNTER — Ambulatory Visit
Admission: RE | Admit: 2022-08-28 | Discharge: 2022-08-28 | Disposition: A | Discharge status: Home / Self Care | Payer: Medicare Other | Source: Ambulatory Visit | Attending: Orthopedic Surgery | Admitting: Orthopedic Surgery

## 2022-08-28 DIAGNOSIS — R202 Paresthesia of skin: Secondary | ICD-10-CM

## 2022-08-28 DIAGNOSIS — M542 Cervicalgia: Secondary | ICD-10-CM

## 2022-08-28 DIAGNOSIS — M5030 Other cervical disc degeneration, unspecified cervical region: Secondary | ICD-10-CM

## 2022-08-28 DIAGNOSIS — M47812 Spondylosis without myelopathy or radiculopathy, cervical region: Secondary | ICD-10-CM

## 2022-08-31 ENCOUNTER — Encounter: Payer: Medicare Other | Admitting: Physical Therapy

## 2022-09-01 ENCOUNTER — Other Ambulatory Visit: Payer: Self-pay | Admitting: Family Medicine

## 2022-09-01 ENCOUNTER — Other Ambulatory Visit: Payer: Medicare Other | Admitting: Pharmacist

## 2022-09-01 ENCOUNTER — Telehealth: Payer: Self-pay | Admitting: Family Medicine

## 2022-09-01 MED ORDER — BREXPIPRAZOLE 1 MG PO TABS: 0.5000 mg | ORAL_TABLET | Freq: Every day | ORAL | 3 refills | Status: DC

## 2022-09-01 NOTE — Telephone Encounter (Signed)
[facility] is requesting prior authorization Key: [id number] Name: Rickel Rexulit 1MG  tablets

## 2022-09-01 NOTE — Progress Notes (Signed)
Patient unable to tolerate Abilify 2mg  daily, replaced with Rexulti 0.5mg  daily

## 2022-09-01 NOTE — Progress Notes (Unsigned)
09/01/2022 Name: Shari Rivera MRN: [id number] DOB: May 08, 1955  Chief Complaint  Patient presents with   Medication Management    [name] is a 67 y.o. year old female who presented for a telephone visit.   They were referred to the pharmacist by their PCP for assistance in managing medication access.      Subjective:   Care Team: Primary Care Provider: Ronnald Ramp, MD ; Next Scheduled Visit: 10/07/2022 Physical Therapist: Cira Rue, PT Neurology: Drake Leach, PA-C  Medication Access/Adherence  Current Pharmacy:  [facility] - [place], [place] - [address] [place] [address] Phone: [phone] Fax: [phone]   Patient reports affordability concerns with their medications: Yes  Patient reports access/transportation concerns to their pharmacy: No  Patient reports adherence concerns with their medications:  No     Reports uses weekly pillbox to organize her medications   Depression:   Current medication:  - sertraline 100 mg daily - aripiprazole 2 mg daily   Previous therapies tried: Rexulti (non preferred through patient's Medicaid formulary)  - Note patient previously took Rexulti from samples from PCP for 1 month and noticed a significant improvement in mood, but stopped when ran out of samples  Reports feels like she has had more difficulty with controlling temper and anxiety since switch from Rexulti to aripiprazole.   Shares that she had recent outburst when presented for MRI. Reports that she has claustrophobia and was advised to arrive early to allow for her to receive Valium prior to the imaging, but then none was provided and she was asked to proceed despite claustrophobia. Reports refused, but became very angry.   Objective:  Lab Results  Component Value Date   HGBA1C 5.7 (H) 06/10/2022    Lab Results  Component Value Date   CREATININE 0.97 06/10/2022   BUN 13 06/10/2022   NA 139 06/10/2022    K 4.6 06/10/2022   CL 105 06/10/2022   CO2 22 06/10/2022    Lab Results  Component Value Date   CHOL 144 03/26/2021   HDL 49 03/26/2021   LDLCALC 79 03/26/2021   TRIG 80 03/26/2021    Medications Reviewed Today     Reviewed by Manuela Neptune, RPH-CPP (Pharmacist) on 09/01/22 at 0845  Med List Status: <None>   Medication Order Taking? Sig Documenting Provider Last Dose Status Informant  alendronate (FOSAMAX) 70 MG tablet [id number]  TAKE 1 TABLET EVERY 7 DAYS WITH A FULL GLASS OF WATER ON AN EMPTY STOMACH DO NOT LIE DOWN FOR AT LEAST 30 MIN Simmons-Robinson, Makiera, MD  Active   ARIPiprazole (ABILIFY) 2 MG tablet [id number] Yes Take 1 tablet (2 mg total) by mouth daily. Simmons-Robinson, Makiera, MD Taking Active   Calcium Carbonate-Vitamin D 600-400 MG-UNIT tablet [id number]  Take 1 tablet by mouth daily. [provider]  Active Self  Coenzyme Q10 (COQ-10) 100 MG capsule [id number]  Take 100 mg by mouth daily. [provider]  Active   famotidine (PEPCID) 40 MG tablet [id number]  Take 1 tablet (40 mg total) by mouth at bedtime. Bosie Clos, MD  Active   fluticasone [facility]) 50 MCG/ACT nasal spray [id number]  Place 2 sprays into both nostrils daily. Simmons-Robinson, Makiera, MD  Active   gabapentin (NEURONTIN) 100 MG capsule [id number]  TAKE 2 CAPSULES BY MOUTH TWICE DAILY. Simmons-Robinson, Makiera, MD  Active   Multiple Vitamin (MULTIVITAMIN WITH MINERALS) TABS tablet [id number]  Take 1 tablet by mouth daily. [provider]  Active Self  naproxen sodium (ALEVE) 220 MG tablet [id number]  Take 220 mg by mouth daily as needed. [provider]  Active   rosuvastatin (CRESTOR) 20 MG tablet [id number]  Take 1 tablet (20 mg total) by mouth daily. Simmons-Robinson, Makiera, MD  Active   sertraline (ZOLOFT) 100 MG tablet [id number] Yes TAKE ONE TABLET BY MOUTH EVERY DAY Simmons-Robinson, Tawanna Cooler, MD Taking Active   Med List Note Neita Garnet,  Tawanna Cooler, MD 02/12/22 1431):                Assessment/Plan:   Patient plans to contact Neurology to reschedule MRI and to discuss need for claustrophobia to be addressed prior to the procedure  Depression: - Collaborate with PCP today regarding patient's report of increased difficulty with controlling temper and anxiety since switch from Rexulti to aripiprazole.  Note Rexulti is a non-preferred option through patient's [employer] dual Medicare-Medicaid coverage with trial of at least 1 preferred medication from the same class is required before approval for non-preferred options.  Recommend provider consider sending prescription for Rexulti and office complete prior authorization for this medication, documenting patient's trial of aripiprazole  Provider states will send Rexulti prescription for pharmacy  Follow up with patient to let her know   Follow Up Plan: Clinical Pharmacist will outreach to patient by telephone again within the next 30 days   Estelle Grumbles, PharmD, [facility] [phone]

## 2022-09-02 ENCOUNTER — Encounter: Payer: Medicare Other | Admitting: Physical Therapy

## 2022-09-02 NOTE — Patient Instructions (Signed)
Goals Addressed             This Visit's Progress    Pharmacy Goals       Please continue to use your weekly pillbox to organize your medications.   You can find more information about your [employer] [place] Medicaid benefits by visiting:    [email or web address]   Estelle Grumbles, PharmD, [facility] [phone]

## 2022-09-03 NOTE — Telephone Encounter (Signed)
Received a fax from covermymeds for Rexulti 0.5mg   Key: [id number]

## 2022-09-06 ENCOUNTER — Telehealth: Payer: Self-pay | Admitting: Family Medicine

## 2022-09-06 NOTE — Telephone Encounter (Signed)
PA initiated for 0.5 mg

## 2022-09-06 NOTE — Telephone Encounter (Signed)
Covermymeds is requesting prior authorization Key: [id number] Name: Clarizio Rexulti needs PA

## 2022-09-07 ENCOUNTER — Telehealth: Payer: Self-pay | Admitting: Family Medicine

## 2022-09-07 NOTE — Telephone Encounter (Signed)
KATHY [facility] (Key[employer]unnie Philips) Rx #: [id number] Rexulti 0.5MG  tablets Form [employer] Medicare Electronic PA Form [phone] NCPDP) Created 6 days ago Sent to Plan 1 day ago Plan Response 1 day ago Submit Clinical Questions 1 day ago Determination Favorable 5 hours ago Message from Plan The case has been Approved from 06/09/2022 to 09/07/2023. Authorization Expiration Date: September 07, 2023.

## 2022-09-07 NOTE — Telephone Encounter (Signed)
Pt stated she missed the call for her AWV. Pt was upset and stated they called her too early. Asked pt if she would like to reschedule, and she said no, she would wait if they called her back, and she was able to speak with them okay if not, oh well. She stated this was sending her into a panic attack. Pt began breathing heavily. I asked her if she was okay if I could grab a nurse for her. Pt declined and told me to just leave her alone. I again asked if I could grab a nurse for her, and she declined and said she was going to calm herself down before she had to drive.  FYI to office.

## 2022-09-08 ENCOUNTER — Telehealth: Payer: Self-pay

## 2022-09-08 ENCOUNTER — Telehealth: Payer: Self-pay | Admitting: Orthopedic Surgery

## 2022-09-08 DIAGNOSIS — M5030 Other cervical disc degeneration, unspecified cervical region: Secondary | ICD-10-CM

## 2022-09-08 DIAGNOSIS — M47812 Spondylosis without myelopathy or radiculopathy, cervical region: Secondary | ICD-10-CM

## 2022-09-08 MED ORDER — DIAZEPAM 5 MG PO TABS
ORAL_TABLET | ORAL | 0 refills | Status: DC
Start: 2022-09-08 — End: 2022-11-10

## 2022-09-08 NOTE — Telephone Encounter (Signed)
See approval notice under media tab in patient's chart. Duplicate

## 2022-09-08 NOTE — Telephone Encounter (Signed)
MRI 09/11/2022, needs valium sent to [facility]

## 2022-09-08 NOTE — Telephone Encounter (Signed)
PA started on Cover My Meds, this is pending  PA Case ID #: [id number]

## 2022-09-08 NOTE — Telephone Encounter (Signed)
Valium sent to pharmacy. Please let her know and remind her that she will need a driver.

## 2022-09-10 NOTE — Telephone Encounter (Signed)
Left voicemail that patient medication was approved and she can pick up at pharmacy.

## 2022-09-10 NOTE — Telephone Encounter (Signed)
Medication has been approved and is valid from 06/12/22 to 09/10/23.   I have faxed this to the pharmacy.   Cristin please let her know.

## 2022-09-10 NOTE — Telephone Encounter (Signed)
Please let the patient know that her Valium prescription was requiring authorization with her insurance however when I call the [employer] it is not pulling her up in the system and the lady told me to call the number on the back of her card but I dont have a number to call. Since her MRI is tomorrow it may be better for her to pay out of pocket if it is not to expensive. Let me know what she would like to do.

## 2022-09-11 ENCOUNTER — Ambulatory Visit
Admission: RE | Admit: 2022-09-11 | Discharge: 2022-09-11 | Disposition: A | Discharge status: Home / Self Care | Payer: Medicare Other | Source: Ambulatory Visit | Attending: Orthopedic Surgery | Admitting: Orthopedic Surgery

## 2022-09-11 DIAGNOSIS — R200 Anesthesia of skin: Secondary | ICD-10-CM | POA: Diagnosis not present

## 2022-09-11 DIAGNOSIS — R202 Paresthesia of skin: Secondary | ICD-10-CM | POA: Diagnosis not present

## 2022-09-11 DIAGNOSIS — M4802 Spinal stenosis, cervical region: Secondary | ICD-10-CM | POA: Diagnosis not present

## 2022-09-11 DIAGNOSIS — M50322 Other cervical disc degeneration at C5-C6 level: Secondary | ICD-10-CM | POA: Diagnosis not present

## 2022-09-11 DIAGNOSIS — M47812 Spondylosis without myelopathy or radiculopathy, cervical region: Secondary | ICD-10-CM | POA: Insufficient documentation

## 2022-09-11 DIAGNOSIS — M542 Cervicalgia: Secondary | ICD-10-CM | POA: Insufficient documentation

## 2022-09-11 DIAGNOSIS — M5030 Other cervical disc degeneration, unspecified cervical region: Secondary | ICD-10-CM | POA: Diagnosis not present

## 2022-09-11 DIAGNOSIS — M5021 Other cervical disc displacement,  high cervical region: Secondary | ICD-10-CM | POA: Diagnosis not present

## 2022-09-13 NOTE — Telephone Encounter (Signed)
Patient was notified on Friday afternoon.

## 2022-09-15 ENCOUNTER — Ambulatory Visit (INDEPENDENT_AMBULATORY_CARE_PROVIDER_SITE_OTHER): Payer: Medicare Other

## 2022-09-15 VITALS — Ht 62.5 in | Wt 125.0 lb

## 2022-09-15 DIAGNOSIS — Z0000 Encounter for general adult medical examination without abnormal findings: Secondary | ICD-10-CM

## 2022-09-15 DIAGNOSIS — Z1211 Encounter for screening for malignant neoplasm of colon: Secondary | ICD-10-CM

## 2022-09-15 NOTE — Progress Notes (Signed)
Subjective:   Shari Rivera is a 67 y.o. female who presents for Medicare Annual (Subsequent) preventive examination.  Visit Complete: Virtual  I connected with  Rachelle Morr on 09/15/22 by a audio enabled telemedicine application and verified that I am speaking with the correct person using two identifiers.  Patient Location: Home  Provider Location: Office/Clinic  I discussed the limitations of evaluation and management by telemedicine. The patient expressed understanding and agreed to proceed.  Vital Signs: Unable to obtain new vitals due to this being a telehealth visit.  Patient Medicare AWV questionnaire was completed by the patient on (not done); I have confirmed that all information answered by patient is correct and no changes since this date.  Review of Systems    Cardiac Risk Factors include: advanced age (>53men, >30 women);dyslipidemia    Objective:    Today's Vitals   09/15/22 0813  Weight: 125 lb (56.7 kg)  Height: 5' 2.5" (1.588 m)   Body mass index is 22.5 kg/m.     09/15/2022    8:34 AM 10/05/2021    8:49 AM 10/05/2017    8:55 AM 05/27/2015   11:04 PM  Advanced Directives  Does Patient Have a Medical Advance Directive? No No No No  Would patient like information on creating a medical advance directive?  No - Patient declined  No - patient declined information    Current Medications (verified) Outpatient Encounter Medications as of 09/15/2022  Medication Sig   alendronate (FOSAMAX) 70 MG tablet TAKE 1 TABLET EVERY 7 DAYS WITH A FULL GLASS OF WATER ON AN EMPTY STOMACH DO NOT LIE DOWN FOR AT LEAST 30 MIN   brexpiprazole (REXULTI) 1 MG TABS tablet Take 0.5 tablets (0.5 mg total) by mouth daily.   Calcium Carbonate-Vitamin D 600-400 MG-UNIT tablet Take 1 tablet by mouth daily.   Coenzyme Q10 (COQ-10) 100 MG capsule Take 100 mg by mouth daily.   diazepam (VALIUM) 5 MG tablet 1 po 30 minutes prior to MRI scan. May repeat x 1 right before MRI scan if  needed. You will need a driver.   famotidine (PEPCID) 40 MG tablet Take 1 tablet (40 mg total) by mouth at bedtime.   fluticasone (FLONASE) 50 MCG/ACT nasal spray Place 2 sprays into both nostrils daily.   gabapentin (NEURONTIN) 100 MG capsule TAKE 2 CAPSULES BY MOUTH TWICE DAILY.   Multiple Vitamin (MULTIVITAMIN WITH MINERALS) TABS tablet Take 1 tablet by mouth daily.   naproxen sodium (ALEVE) 220 MG tablet Take 220 mg by mouth daily as needed.   rosuvastatin (CRESTOR) 20 MG tablet Take 1 tablet (20 mg total) by mouth daily.   sertraline (ZOLOFT) 100 MG tablet TAKE ONE TABLET BY MOUTH EVERY DAY   No facility-administered encounter medications on file as of 09/15/2022.    Allergies (verified) Codeine   History: Past Medical History:  Diagnosis Date   Allergy    Anemia    Anxiety    Arthritis    Cancer (HCC)    Cataract    Depression    Endometriosis    Past Surgical History:  Procedure Laterality Date   APPENDECTOMY     NASAL SEPTUM SURGERY     SMALL INTESTINE SURGERY     Family History  Problem Relation Age of Onset   Cancer Mother    Stroke Mother    COPD Mother    Heart failure Mother    Atrial fibrillation Mother    Heart disease Father    Cancer Maternal  Grandmother    Diabetes Maternal Grandmother    Kidney disease Maternal Grandfather    Heart disease Maternal Grandfather    Heart disease Paternal Grandfather    Breast cancer Other        mggm   Social History   Socioeconomic History   Marital status: Single    Spouse name: Not on file   Number of children: Not on file   Years of education: Not on file   Highest education level: Not on file  Occupational History   Not on file  Tobacco Use   Smoking status: Former   Smokeless tobacco: Never  Vaping Use   Vaping status: Former  Substance and Sexual Activity   Alcohol use: No   Drug use: No   Sexual activity: Not on file  Other Topics Concern   Not on file  Social History Narrative   Not on  file   Social Determinants of Health   Financial Resource Strain: Low Risk  (09/15/2022)   Overall Financial Resource Strain (CARDIA)    Difficulty of Paying Living Expenses: Not hard at all  Food Insecurity: No Food Insecurity (09/15/2022)   Hunger Vital Sign    Worried About Running Out of Food in the Last Year: Never true    Ran Out of Food in the Last Year: Never true  Transportation Needs: No Transportation Needs (09/15/2022)   PRAPARE - Administrator, Civil Service (Medical): No    Lack of Transportation (Non-Medical): No  Physical Activity: Sufficiently Active (09/15/2022)   Exercise Vital Sign    Days of Exercise per Week: 5 days    Minutes of Exercise per Session: 40 min  Stress: Stress Concern Present (09/15/2022)   [employer] of Occupational Health - Occupational Stress Questionnaire    Feeling of Stress : To some extent  Social Connections: Moderately Isolated (09/15/2022)   Social Connection and Isolation Panel [NHANES]    Frequency of Communication with Friends and Family: More than three times a week    Frequency of Social Gatherings with Friends and Family: Twice a week    Attends Religious Services: More than 4 times per year    Active Member of [employer] or Organizations: No    Attends Engineer, structural: Never    Marital Status: Never married    Tobacco Counseling Counseling given: Not Answered   Clinical Intake:  Pre-visit preparation completed: Yes  Pain : No/denies pain     BMI - recorded: 22.5 Nutritional Status: BMI of 19-24  Normal Nutritional Risks: None Diabetes: No  How often do you need to have someone help you when you read instructions, pamphlets, or other written materials from your doctor or pharmacy?: 1 - Never  Interpreter Needed?: No  Comments: lives alone Information entered by :: B.Alinah Sheard,LPN   Activities of Daily Living    09/15/2022    8:35 AM 07/07/2022    3:56 PM  In your present state of  health, do you have any difficulty performing the following activities:  Hearing? 1 1  Vision? 1 1  Difficulty concentrating or making decisions? 1 1  Walking or climbing stairs? 1 0  Dressing or bathing? 0 0  Doing errands, shopping? 0 0  Preparing Food and eating ? N   Using the Toilet? N   In the past six months, have you accidently leaked urine? Y   Do you have problems with loss of bowel control? N   Managing your Medications? N  Managing your Finances? N   Housekeeping or managing your Housekeeping? N     Patient Care Team: Ronnald Ramp, MD as PCP - General (Family Medicine) Ronney Asters, Jackelyn Poling, RPH-CPP as Pharmacist Irene Limbo., MD (Ophthalmology)  Indicate any recent Medical Services you may have received from other than [facility] providers in the past year (date may be approximate).     Assessment:   This is a routine wellness examination for [facility].  Hearing/Vision screen Hearing Screening - Comments:: Inadequate hearing: needs new ENT ON:[id number] sent Vision Screening - Comments:: Inadequate vision in lt eye;has film over it; rt eye minimal vision Dr Alvester Morin  Dietary issues and exercise activities discussed:     Goals Addressed             This Visit's Progress    CCM Expected Outcome:  Monitor, Self-Manage and Reduce Symptoms of:   On track    DIET - EAT MORE FRUITS AND VEGETABLES   On track    Pharmacy Goals   On track    Please continue to use your weekly pillbox to organize your medications.   You can find more information about your [employer] [place] benefits by visiting:    [email or web address]    Estelle Grumbles, PharmD, [facility] [phone]       Depression Screen    09/15/2022    8:30 AM 07/07/2022    3:56 PM 06/10/2022    3:45 PM 03/10/2022    3:41 PM 02/11/2022    3:22 PM 10/05/2021    8:45 AM 11/11/2020    4:06 PM  PHQ 2/9 Scores   PHQ - 2 Score 4 6 6 6 6 2 4   PHQ- 9 Score 14 18 22 21 24 4 18     Fall Risk    09/15/2022    8:21 AM 07/07/2022    3:56 PM 06/10/2022    3:41 PM 03/10/2022    3:40 PM 02/11/2022    3:21 PM  Fall Risk   Falls in the past year? 0 1 1 1 1   Number falls in past yr: 1 1 1 1 1   Comment   5 or 6 10 5-6  Injury with Fall? 0 0 0 0 0  Risk for fall due to : Impaired vision;Impaired balance/gait History of fall(s) Impaired balance/gait    Follow up Education provided;Falls prevention discussed Falls evaluation completed       MEDICARE RISK AT HOME: Medicare Risk at Home Any stairs in or around the home?: Yes If so, are there any without handrails?: No (back door:does not ZDG:UYQI ramp) Home free of loose throw rugs in walkways, pet beds, electrical cords, etc?: Yes Adequate lighting in your home to reduce risk of falls?: Yes Life alert?: No Use of a cane, walker or w/c?: No Grab bars in the bathroom?: Yes Shower chair or bench in shower?: No Elevated toilet seat or a handicapped toilet?: Yes  TIMED UP AND GO:  Was the test performed?  No    Cognitive Function:        09/15/2022    8:37 AM 10/05/2021    8:54 AM  6CIT Screen  What Year? 0 points 0 points  What month? 0 points 0 points  What time? 0 points 0 points  Count back from 20 0 points 0 points  Months in reverse 0 points 0 points  Repeat phrase 0 points 0 points  Total Score 0 points 0 points    Immunizations Immunization  History  Administered Date(s) Administered   COVID-19, mRNA, vaccine(Comirnaty)12 years and older 03/10/2022   PFIZER(Purple Top)SARS-COV-2 Vaccination 05/09/2019, 05/31/2019, 01/23/2020, 11/19/2020   PNEUMOCOCCAL CONJUGATE-20 07/07/2022   Zoster Recombinant(Shingrix) 07/07/2022    TDAP status: Up to date  Flu Vaccine status: Declined, Education has been provided regarding the importance of this vaccine but patient still declined. Advised may receive this vaccine at local pharmacy or Health Dept.  Aware to provide a copy of the vaccination record if obtained from local pharmacy or Health Dept. Verbalized acceptance and understanding.  Pneumococcal vaccine status: Up to date  Covid-19 vaccine status: Completed vaccines  Qualifies for Shingles Vaccine? Yes   Zostavax completed No   Shingrix Completed?: No.    Education has been provided regarding the importance of this vaccine. Patient has been advised to call insurance company to determine out of pocket expense if they have not yet received this vaccine. Advised may also receive vaccine at local pharmacy or Health Dept. Verbalized acceptance and understanding.  Screening Tests Health Maintenance  Topic Date Due   DTaP/Tdap/Td (1 - Tdap) Never done   Colonoscopy  Never done   COVID-19 Vaccine (6 - 2023-24 season) 07/09/2022   Zoster Vaccines- Shingrix (2 of 2) 09/01/2022   INFLUENZA VACCINE  08/26/2022   MAMMOGRAM  04/22/2023   Medicare Annual Wellness (AWV)  09/15/2023   Pneumonia Vaccine 25+ Years old  Completed   DEXA SCAN  Completed   Hepatitis C Screening  Completed   HPV VACCINES  Aged Out    Health Maintenance  Health Maintenance Due  Topic Date Due   DTaP/Tdap/Td (1 - Tdap) Never done   Colonoscopy  Never done   COVID-19 Vaccine (6 - 2023-24 season) 07/09/2022   Zoster Vaccines- Shingrix (2 of 2) 09/01/2022   INFLUENZA VACCINE  08/26/2022    Colorectal cancer screening: Referral to GI placed yes. Pt aware the office will call re: appt.  Mammogram status: Completed yes. Repeat every year  Bone Density status: Completed yes. Results reflect: Bone density results: OSTEOPOROSIS. Repeat every 3-5 years.  Lung Cancer Screening: (Low Dose CT Chest recommended if Age 71-80 years, 20 pack-year currently smoking OR have quit w/in 15years.) does not qualify.   Lung Cancer Screening Referral: no  Additional Screening:  Hepatitis C Screening: does not qualify; Completed yes  Vision Screening: Recommended annual  ophthalmology exams for early detection of glaucoma and other disorders of the eye. Is the patient up to date with their annual eye exam?  Yes  Who is the provider or what is the name of the office in which the patient attends annual eye exams? Dr Alvester Morin If pt is not established with a provider, would they like to be referred to a provider to establish care? No .   Dental Screening: Recommended annual dental exams for proper oral hygiene  Diabetic Foot Exam: n/a  Community Resource Referral / Chronic Care Management: CRR required this visit?  No   CCM required this visit?  No    Plan:     I have personally reviewed and noted the following in the patient's chart:   Medical and social history Use of alcohol, tobacco or illicit drugs  Current medications and supplements including opioid prescriptions. Patient is not currently taking opioid prescriptions. Functional ability and status Nutritional status Physical activity Advanced directives List of other physicians Hospitalizations, surgeries, and ER visits in previous 12 months Vitals Screenings to include cognitive, depression, and falls Referrals and appointments  In addition,  I have reviewed and discussed with patient certain preventive protocols, quality metrics, and best practice recommendations. A written personalized care plan for preventive services as well as general preventive health recommendations were provided to patient.    Sue Lush, LPN   05/03/8117   After Visit Summary: (MyChart) Due to this being a telephonic visit, the after visit summary with patients personalized plan was offered to patient via MyChart   Nurse Notes: Pt says she has many problems getting to sleep and staying asleep. She relays she never gets more than 4 hours a day:as a result she feels tired all the time. The melatonin did not work and she would like to explore what else she can do to get more sleep.  Pt relays she has urinary leaks  and accidents all day. She goes through 60pads per week. Would like referral to urologist Pt relays she has hearing problems and would like a referral to ENT. She has found a place in [place] that accepts her insurance. She will send mychart message who that provider is. ADVance directives mailed to pt per request.

## 2022-09-15 NOTE — Patient Instructions (Signed)
[name] , Thank you for taking time to come for your Medicare Wellness Visit. I appreciate your ongoing commitment to your health goals. Please review the following plan we discussed and let me know if I can assist you in the future.   Referrals/Orders/Follow-Ups/Clinician Recommendations: referral to [facility] for colonoscopy  This is a list of the screening recommended for you and due dates:  Health Maintenance  Topic Date Due   DTaP/Tdap/Td vaccine (1 - Tdap) Never done   Colon Cancer Screening  Never done   COVID-19 Vaccine (6 - 2023-24 season) 07/09/2022   Zoster (Shingles) Vaccine (2 of 2) 09/01/2022   Flu Shot  08/26/2022   Mammogram  04/22/2023   Medicare Annual Wellness Visit  09/15/2023   Pneumonia Vaccine  Completed   DEXA scan (bone density measurement)  Completed   Hepatitis C Screening  Completed   HPV Vaccine  Aged Out    Advanced directives: (Provided) Advance directive discussed with you today. I have provided a copy for you to complete at home and have notarized. Once this is complete, please bring a copy in to our office so we can scan it into your chart.  MAILED to pt home per request  Next Medicare Annual Wellness Visit scheduled for next year: Yes 09/19/23 @1pm  telephone

## 2022-09-16 ENCOUNTER — Other Ambulatory Visit: Payer: Self-pay | Admitting: Family Medicine

## 2022-09-20 ENCOUNTER — Ambulatory Visit
Admission: EM | Admit: 2022-09-20 | Discharge: 2022-09-20 | Disposition: A | Discharge status: Home / Self Care | Payer: Medicare Other

## 2022-09-20 DIAGNOSIS — M79674 Pain in right toe(s): Secondary | ICD-10-CM

## 2022-09-20 NOTE — ED Triage Notes (Signed)
Patient to Urgent Care with complaints of right sided pinky toe pain that started two weeks ago.   Reports she nicked the skin on her toe when she was cutting her toenail and accidentally removed the entire toe nail. Redness and pain but no drainage. Taking aleve.

## 2022-09-20 NOTE — ED Provider Notes (Signed)
Renaldo Fiddler    CSN: [id number] Arrival date & time: 09/20/22  1102      History   Chief Complaint Chief Complaint  Patient presents with   Toe Pain    HPI Shari Rivera is a 67 y.o. female.   Patient presents for evaluation of right fifth toe pain present for 2 weeks.  Symptoms began after clipping the toenail which she left a piece of skin hanging, pulled on the skin and remove the entire toe and skin in its entirety.  Site has not healed.  Pain is exacerbated when wearing shoe and she has noticed that the side of the toe has started to rub against the fourth toe.  Denies presence of drainage.  Has taken Aleve and has cleansed.  Past Medical History:  Diagnosis Date   Allergy    Anemia    Anxiety    Arthritis    Cancer (HCC)    Cataract    Depression    Endometriosis     Patient Active Problem List   Diagnosis Date Noted   Falls frequently 06/10/2022   Neck pain 06/10/2022   Abnormal gait 06/10/2022   Acute otalgia, right 06/10/2022   Continuous leakage of urine 06/10/2022   Hearing loss associated with syndrome of both ears 06/10/2022   Esophageal dysphagia 02/12/2022   Recurrent major depressive disorder, in partial remission (HCC) 02/12/2022   Hyperlipidemia 02/12/2022   Age-related osteoporosis without current pathological fracture 02/11/2022    Past Surgical History:  Procedure Laterality Date   APPENDECTOMY     NASAL SEPTUM SURGERY     SMALL INTESTINE SURGERY      OB History   No obstetric history on file.      Home Medications    Prior to Admission medications   Medication Sig Start Date End Date Taking? Authorizing Provider  REXULTI 0.5 MG TABS Take 1 tablet by mouth daily. 09/09/22  Yes [provider]  alendronate (FOSAMAX) 70 MG tablet TAKE 1 TABLET EVERY 7 DAYS WITH A FULL GLASS OF WATER ON AN EMPTY STOMACH DO NOT LIE DOWN FOR AT LEAST 30 MIN 08/17/22   Simmons-Robinson, Makiera, MD  brexpiprazole (REXULTI) 1 MG TABS  tablet Take 0.5 tablets (0.5 mg total) by mouth daily. 09/01/22   Simmons-Robinson, Tawanna Cooler, MD  Calcium Carbonate-Vitamin D 600-400 MG-UNIT tablet Take 1 tablet by mouth daily.    [provider]  Coenzyme Q10 (COQ-10) 100 MG capsule Take 100 mg by mouth daily.    [provider]  diazepam (VALIUM) 5 MG tablet 1 po 30 minutes prior to MRI scan. May repeat x 1 right before MRI scan if needed. You will need a driver. 09/08/22   Drake Leach, PA-C  famotidine (PEPCID) 40 MG tablet Take 1 tablet (40 mg total) by mouth at bedtime. 09/21/21   Bosie Clos, MD  fluticasone (FLONASE) 50 MCG/ACT nasal spray Place 2 sprays into both nostrils daily. 06/10/22   Simmons-Robinson, Makiera, MD  gabapentin (NEURONTIN) 100 MG capsule TAKE 2 CAPSULES BY MOUTH TWICE DAILY. 09/17/22   Malva Limes, MD  Multiple Vitamin (MULTIVITAMIN WITH MINERALS) TABS tablet Take 1 tablet by mouth daily.    [provider]  naproxen sodium (ALEVE) 220 MG tablet Take 220 mg by mouth daily as needed.    [provider]  rosuvastatin (CRESTOR) 20 MG tablet Take 1 tablet (20 mg total) by mouth daily. 02/11/22   Simmons-Robinson, Makiera, MD  sertraline (ZOLOFT) 100 MG tablet TAKE  ONE TABLET BY MOUTH EVERY DAY 05/17/22   Simmons-Robinson, Tawanna Cooler, MD    Family History Family History  Problem Relation Age of Onset   Cancer Mother    Stroke Mother    COPD Mother    Heart failure Mother    Atrial fibrillation Mother    Heart disease Father    Cancer Maternal Grandmother    Diabetes Maternal Grandmother    Kidney disease Maternal Grandfather    Heart disease Maternal Grandfather    Heart disease Paternal Grandfather    Breast cancer Other        mggm    Social History Social History   Tobacco Use   Smoking status: Former   Smokeless tobacco: Never  Vaping Use   Vaping status: Former  Substance Use Topics   Alcohol use: No   Drug use: No     Allergies   Codeine   Review of  Systems Review of Systems   Physical Exam Triage Vital Signs ED Triage Vitals  Encounter Vitals Group     BP 09/20/22 1148 110/69     Systolic BP Percentile --      Diastolic BP Percentile --      Pulse Rate 09/20/22 1148 72     Resp 09/20/22 1148 16     Temp 09/20/22 1148 98.1 F (36.7 C)     Temp Source 09/20/22 1148 Oral     SpO2 09/20/22 1148 98 %     Weight 09/20/22 1151 125 lb (56.7 kg)     Height 09/20/22 1151 5' 2.5" (1.588 m)     Head Circumference --      Peak Flow --      Pain Score 09/20/22 1147 7     Pain Loc --      Pain Education --      Exclude from Growth Chart --    No data found.  Updated Vital Signs BP 110/69 (BP Location: Right Arm)   Pulse 72   Temp 98.1 F (36.7 C) (Oral)   Resp 16   Ht 5' 2.5" (1.588 m)   Wt 125 lb (56.7 kg)   SpO2 98%   BMI 22.50 kg/m   Visual Acuity Right Eye Distance:   Left Eye Distance:   Bilateral Distance:    Right Eye Near:   Left Eye Near:    Bilateral Near:     Physical Exam Constitutional:      Appearance: Normal appearance.  Eyes:     Extraocular Movements: Extraocular movements intact.  Pulmonary:     Effort: Pulmonary effort is normal.  Feet:     Comments: Nailbed exposed to the right fifth toe, intact, no drainage noted, lateral corner of the nail present, tender to palpation, able to bear weight, able to complete range of motion, sensation intact, capillary refill less than 3 Neurological:     Mental Status: She is alert and oriented to person, place, and time. Mental status is at baseline.      UC Treatments / Results  Labs (all labs ordered are listed, but only abnormal results are displayed) Labs Reviewed - No data to display  EKG   Radiology No results found.  Procedures Procedures (including critical care time)  Medications Ordered in UC Medications - No data to display  Initial Impression / Assessment and Plan / UC Course  I have reviewed the triage vital signs and the  nursing notes.  Pertinent labs & imaging results that were available during  my care of the patient were reviewed by me and considered in my medical decision making (see chart for details).  Pain of toe of right foot  Has removed approximately 90% of the nail and the nailbed is exposed without signs of infection, prophylactically providing coverage to promote healing and to keep clean, cephalexin sent to pharmacy, prednisone prescribed for management of pain, may use Tylenol additionally, advised daily cleansing, has been covering with Band-Aid but this is still out for friction therefore has recommended callus padding whenever wearing shoes, podiatry referral given for reevaluation if symptoms continue to persist Final Clinical Impressions(s) / UC Diagnoses   Final diagnoses:  Pain of toe of right foot     Discharge Instructions      On examination majority of the toe nail has been removed and has a left the nailbed exposed which has not healed which is the source of your pain  You have been placed on antibiotic prophylactically to ensure that a germ has not caused prolonged healing  Take cephalexin every 8 hours for the next 5 days  Begin prednisone every morning with food for 5 days which will help reduce swelling and help with pain, may use Tylenol every 6 hours in addition to this as needed  Area has been dressed here in the office  While at the pharmacy purchase callus padding which may be found in the footcare section which will give cushioning over the toe and help prevent friction when wearing shoes  If symptoms have not resolved at completion of medication please schedule follow-up appointment with podiatrist who is the foot specialist, information is listed on front page   ED Prescriptions   None    PDMP not reviewed this encounter.   Valinda Hoar, NP 09/20/22 1229

## 2022-09-20 NOTE — Discharge Instructions (Signed)
On examination majority of the toe nail has been removed and has a left the nailbed exposed which has not healed which is the source of your pain  You have been placed on antibiotic prophylactically to ensure that a germ has not caused prolonged healing  Take cephalexin every 8 hours for the next 5 days  Begin prednisone every morning with food for 5 days which will help reduce swelling and help with pain, may use Tylenol every 6 hours in addition to this as needed  Area has been dressed here in the office  While at the pharmacy purchase callus padding which may be found in the footcare section which will give cushioning over the toe and help prevent friction when wearing shoes  If symptoms have not resolved at completion of medication please schedule follow-up appointment with podiatrist who is the foot specialist, information is listed on front page

## 2022-09-21 ENCOUNTER — Telehealth: Payer: Self-pay | Admitting: Emergency Medicine

## 2022-09-21 MED ORDER — PREDNISONE 10 MG PO TABS: 40.0000 mg | ORAL_TABLET | Freq: Every day | ORAL | 0 refills | Status: AC

## 2022-09-21 MED ORDER — CEPHALEXIN 500 MG PO CAPS: 500.0000 mg | ORAL_CAPSULE | Freq: Three times a day (TID) | ORAL | 0 refills | Status: AC

## 2022-09-21 NOTE — Telephone Encounter (Signed)
Patient came up to clinic to ask about prescriptions she was told about yesterday at her visit.  Reviewed, I do see in note for medications ot be sent, but no prescriptions in system.  Reviewed with Hansel Starling, APP and will send now.  Apologized to patient while she was at the clinic for the mishap and assured her we would get it sent, she was upset,but seemed satisfied.  Verified pharmacy with patient before she left the clinic

## 2022-09-24 ENCOUNTER — Other Ambulatory Visit: Payer: Medicare Other | Admitting: Pharmacist

## 2022-09-24 ENCOUNTER — Encounter: Payer: Self-pay | Admitting: Pharmacist

## 2022-09-24 ENCOUNTER — Other Ambulatory Visit: Payer: Self-pay | Admitting: Family Medicine

## 2022-09-24 ENCOUNTER — Telehealth: Payer: Self-pay | Admitting: Pharmacist

## 2022-09-24 DIAGNOSIS — K219 Gastro-esophageal reflux disease without esophagitis: Secondary | ICD-10-CM

## 2022-09-24 NOTE — Progress Notes (Signed)
   Outreach Note  09/24/2022 Name: Kmari Kasprzak MRN: [id number] DOB: Mar 04, 1955  Referred by: Ronnald Ramp, MD  Was unable to reach patient via telephone today and have left HIPAA compliant voicemail asking patient to return my call.   Follow Up Plan: Will collaborate with Care Guide to outreach to schedule follow up with me  Estelle Grumbles, PharmD, [facility] [phone]

## 2022-09-24 NOTE — Telephone Encounter (Signed)
This encounter was created in error - please disregard.

## 2022-09-28 NOTE — Telephone Encounter (Signed)
Requested Prescriptions  Pending Prescriptions Disp Refills   famotidine (PEPCID) 40 MG tablet [Pharmacy Med Name: FAMOTIDINE 40 MG TAB] 30 tablet 6    Sig: TAKE ONE TABLET BY MOUTH AT BEDTIME     Gastroenterology:  H2 Antagonists Passed - 09/24/2022  6:21 PM      Passed - Valid encounter within last 12 months    Recent Outpatient Visits           2 months ago Recurrent major depressive disorder, in partial remission (HCC)   [place] [facility] Simmons-Robinson, Dor[place], MD   3 months ago Falls frequently   [place] [facility] Simmons-Robinson, [place], MD   6 months ago Age-related osteoporosis without current pathological fracture   [place] [facility] Simmons-Robinson, [place], MD   7 months ago Age-related osteoporosis without current pathological fracture   [place] [facility] Ronnald Ramp, MD   1 year ago Neck pain   [place] [facility] Bosie Clos, MD       Future Appointments             In 1 week Simmons-Robinson, Tawanna Cooler, MD [facility]

## 2022-09-29 ENCOUNTER — Encounter: Payer: Self-pay | Admitting: *Deleted

## 2022-10-07 ENCOUNTER — Ambulatory Visit: Payer: Medicare Other | Admitting: Family Medicine

## 2022-10-07 ENCOUNTER — Ambulatory Visit (INDEPENDENT_AMBULATORY_CARE_PROVIDER_SITE_OTHER): Payer: Medicare Other | Admitting: Family Medicine

## 2022-10-07 ENCOUNTER — Telehealth: Payer: Self-pay | Admitting: Orthopedic Surgery

## 2022-10-07 ENCOUNTER — Encounter: Payer: Self-pay | Admitting: Family Medicine

## 2022-10-07 VITALS — BP 120/61 | HR 79 | Temp 98.4°F | Ht 63.0 in | Wt 124.8 lb

## 2022-10-07 DIAGNOSIS — F3341 Major depressive disorder, recurrent, in partial remission: Secondary | ICD-10-CM | POA: Diagnosis not present

## 2022-10-07 DIAGNOSIS — Z23 Encounter for immunization: Secondary | ICD-10-CM | POA: Diagnosis not present

## 2022-10-07 NOTE — Progress Notes (Signed)
Established patient visit   Patient: Shari Rivera   DOB: 10-Nov-1955   67 y.o. Female  MRN: [id number] Visit Date: 10/07/2022  Today's healthcare provider: Ronnald Ramp, MD   No chief complaint on file.  Subjective       Discussed the use of AI scribe software for clinical note transcription with the patient, who gave verbal consent to proceed.  History of Present Illness   The patient, with a history of depression and anxiety, reports significant improvement in mood since starting Rexulti, which has now been approved for her use. She describes an increased interest in activities she enjoys, such as decorating her home for various seasons and holidays. She reports that this has contributed to her improved mood.  The patient also reports swallowing issues with her vitamin and calcium supplements, which she manages by cutting the pills in half. She has no issues swallowing her Fosamax medication for bone health. She also takes Crestor for cholesterol management and Zoloft for her mental health, both of which she reports are working well in combination with the Rexulti.  She has been in contact with a neurosurgeon regarding her MRI results, which indicate arthritis. She discontinued physical therapy due to worsening symptoms and an unsatisfactory experience with a new therapist.  The patient also reports a recent issue with her toe, which required a visit to an urgent care center. She accidentally removed her toenail while attempting to trim it, which resulted in significant bleeding and skin loss. After two weeks of worsening symptoms, she sought medical attention and was prescribed an antibiotic and a steroid, which resolved the issue.  The patient's blood pressure at the time of the visit was 120/61, which she notes is slightly elevated compared to her usual readings. She attributes this to increased physical activity, as she has been parking further away and taking the  stairs to increase her exercise. She also reports a pre-diabetic A1C level of 5.7, which she is attempting to manage through lifestyle modifications.      Flowsheet Row Office Visit from 10/07/2022 in [facility]  PHQ-9 Total Score 19          10/07/2022    3:37 PM 11/11/2020    4:52 PM  GAD 7 : Generalized Anxiety Score  Nervous, Anxious, on Edge 2 3  Control/stop worrying 3 3  Worry too much - different things 3 3  Trouble relaxing 2 3  Restless 2 3  Easily annoyed or irritable 2 3  Afraid - awful might happen 3 0  Total GAD 7 Score 17 18  Anxiety Difficulty Somewhat difficult Very difficult      Past Medical History:  Diagnosis Date   Allergy    Anemia    Anxiety    Arthritis    Cancer (HCC)    Cataract    Depression    Endometriosis     Medications: Outpatient Medications Prior to Visit  Medication Sig   alendronate (FOSAMAX) 70 MG tablet TAKE 1 TABLET EVERY 7 DAYS WITH A FULL GLASS OF WATER ON AN EMPTY STOMACH DO NOT LIE DOWN FOR AT LEAST 30 MIN   brexpiprazole (REXULTI) 1 MG TABS tablet Take 0.5 tablets (0.5 mg total) by mouth daily.   Calcium Carbonate-Vitamin D 600-400 MG-UNIT tablet Take 1 tablet by mouth daily.   Coenzyme Q10 (COQ-10) 100 MG capsule Take 100 mg by mouth daily.   famotidine (PEPCID) 40 MG tablet TAKE ONE TABLET BY MOUTH  AT BEDTIME   fluticasone (FLONASE) 50 MCG/ACT nasal spray Place 2 sprays into both nostrils daily.   gabapentin (NEURONTIN) 100 MG capsule TAKE 2 CAPSULES BY MOUTH TWICE DAILY.   Multiple Vitamin (MULTIVITAMIN WITH MINERALS) TABS tablet Take 1 tablet by mouth daily.   naproxen sodium (ALEVE) 220 MG tablet Take 220 mg by mouth daily as needed.   rosuvastatin (CRESTOR) 20 MG tablet Take 1 tablet (20 mg total) by mouth daily.   sertraline (ZOLOFT) 100 MG tablet TAKE ONE TABLET BY MOUTH EVERY DAY   [DISCONTINUED] REXULTI 0.5 MG TABS Take 1 tablet by mouth daily.   diazepam (VALIUM) 5 MG tablet 1 po 30  minutes prior to MRI scan. May repeat x 1 right before MRI scan if needed. You will need a driver. (Patient not taking: Reported on 10/07/2022)   No facility-administered medications prior to visit.    Review of Systems      Objective    BP 120/61 (BP Location: Right Arm, Patient Position: Sitting, Cuff Size: Normal)   Pulse 79   Temp 98.4 F (36.9 C) (Oral)   Ht 5\' 3"  (1.6 m)   Wt 124 lb 12.8 oz (56.6 kg)   SpO2 100%   BMI 22.11 kg/m     Physical Exam Vitals reviewed.  Constitutional:      General: She is not in acute distress.    Appearance: Normal appearance. She is normal weight. She is not ill-appearing, toxic-appearing or diaphoretic.     Comments: Well groomed, calmly sitting in exam rooom  Eyes:     Conjunctiva/sclera: Conjunctivae normal.  Cardiovascular:     Rate and Rhythm: Normal rate and regular rhythm.     Pulses: Normal pulses.     Heart sounds: Normal heart sounds. No murmur heard.    No friction rub. No gallop.  Pulmonary:     Effort: Pulmonary effort is normal. No respiratory distress.     Breath sounds: Normal breath sounds. No stridor. No wheezing, rhonchi or rales.  Abdominal:     General: Bowel sounds are normal. There is no distension.     Palpations: Abdomen is soft.     Tenderness: There is no abdominal tenderness.  Musculoskeletal:     Right lower leg: No edema.     Left lower leg: No edema.  Feet:     Comments: Affected toe appears well healed and does not have edema nor erythema on exam  Skin:    Findings: No erythema or rash.  Neurological:     Mental Status: She is alert and oriented to person, place, and time.  Psychiatric:        Attention and Perception: Attention and perception normal. She is attentive. She does not perceive auditory or visual hallucinations.        Mood and Affect: Mood and affect normal.        Speech: Speech normal.        Behavior: Behavior normal. Behavior is cooperative.        Thought Content: Thought  content normal. Thought content is not paranoid or delusional. Thought content does not include homicidal or suicidal ideation. Thought content does not include homicidal or suicidal plan.        Judgment: Judgment normal.       No results found for any visits on 10/07/22.  Assessment & Plan     Problem List Items Addressed This Visit     Recurrent major depressive disorder, in partial remission (HCC)  Chronic, stable Improved on Rexulti 2mg  once daily, continue this regimen  Improved mood      Other Visit Diagnoses     Need for influenza vaccination    -  Primary   Relevant Orders   Flu Vaccine Trivalent High Dose (Fluad) (Completed)          Depression Improved mood with Rexulti 1mg  daily. Patient reports increased engagement in activities she enjoys, such as decorating. -Continue Rexulti 1mg  daily. -Continue Zoloft 100mg  daily.  Anxiety Persistent, but improved with Rexulti. -Continue Rexulti 1mg  daily.  Osteoporosis Tolerating Fosamax well, no swallowing issues reported. -Continue Fosamax once weekly.  Hyperlipidemia No issues reported with Crestor. -Continue Crestor 20mg  daily.  Prediabetes A1C 5.7 in May, patient aware of family history and is making efforts to control diet. -Plan to recheck A1C after patient has seen specialists and made dietary adjustments.  General Health Maintenance -Administer influenza vaccine today. -Schedule follow-up appointment in January. -Patient to follow up with ENT specialist in October for hearing and throat issues. -Patient to schedule colonoscopy and endoscopy. -Patient to follow up after MRI results with neurosurgeon.         Return in about 4 months (around 02/06/2023) for CHRONIC F/U.         Ronnald Ramp, MD  [facility] [phone] (phone) [phone] (fax)  [facility]

## 2022-10-07 NOTE — Telephone Encounter (Signed)
EMG scheduled 10/12/2022  Left message to call back.

## 2022-10-07 NOTE — Telephone Encounter (Signed)
-----   Message from [place] sent at 09/30/2022  1:44 PM EDT ----- Left message to call back ----- Message ----- From: Rockey Situ Sent: 09/28/2022   8:38 AM EDT To: Rockey Situ  Left message to call back ----- Message ----- From: Drake Leach, PA-C Sent: 09/28/2022   8:09 AM EDT To: [facility] Admin  Please call her to schedule f/u with me to review her cervical MRI. Doesn't look like she had her EMG at [facility], does she want to reschedule this and see me for MRI and EMG?   Thanks

## 2022-10-07 NOTE — Assessment & Plan Note (Addendum)
Chronic, stable Improved on Rexulti 2mg  once daily, continue this regimen  Improved mood

## 2022-10-12 ENCOUNTER — Encounter: Payer: Self-pay | Admitting: Family Medicine

## 2022-10-12 DIAGNOSIS — R200 Anesthesia of skin: Secondary | ICD-10-CM | POA: Diagnosis not present

## 2022-10-12 DIAGNOSIS — R202 Paresthesia of skin: Secondary | ICD-10-CM | POA: Diagnosis not present

## 2022-10-25 ENCOUNTER — Telehealth: Payer: Self-pay

## 2022-10-25 NOTE — Telephone Encounter (Signed)
Patient confirmed appt for 11/01/2022

## 2022-10-25 NOTE — Progress Notes (Signed)
  Care Coordination Note  10/25/2022 Name: Elzina Devera MRN: [id number] DOB: 06/19/1955  Devlynn Knoff is a 67 y.o. year old female who is a primary care patient of Simmons-Robinson, Tawanna Cooler, MD and is actively engaged with the Chronic Care Management team. I reached out to Augusto Gamble by phone today to assist with re-scheduling a follow up visit with the Pharmacist  Follow up plan: Unsuccessful telephone outreach attempt made. A HIPAA compliant phone message was left for the patient providing contact information and requesting a return call.  If patient returns call to provider office, please advise to call CCM Care Guide Reona Zendejas  at [phone]  Penne Lash, RMA Care Guide [facility]  [place], [place] [address] Direct Dial: [phone] [email or web address]

## 2022-10-29 NOTE — Progress Notes (Unsigned)
Referring Physician:  No referring provider defined for this encounter.  Primary Physician:  Ronnald Ramp, MD  History of Present Illness: 10/29/2022 [name] has a history of osteoporosis, depression, GERD, hearing loss, hyperlipidemia.   Last seen by me on 07/28/22 for  3+ year history of intermittent neck pain with limited ROM. She has intermittent right > left sided neck pain that radiates into her shoulders. No pain in her arms, but she has constant tingling in her hands.    She has known cervical spondylosis and DDD C5-C7.   MRI of cervical spine was ordered along with an EMG. She is here to review them.   She has constant right sided neck pain with some radiation to her shoulders. Pain on left side is better. She has constant numbness and tingling in left hand, intermittent in right hand. 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   She balance issues that are getting worse. She has dexterity issues in hands- may be due to numbness and/or arthritis.   She is on neurontin- not sure it helps.   Bowel/Bladder Dysfunction: history of chronic urinary incontinence x 3 years- she does not have sensation to void. No bowel issues.   Conservative measures:  Physical therapy: initial eval for neck pain on 07/14/22- did 3 additional visits and stopped because it made her worse.  Multimodal medical therapy including regular antiinflammatories: gabapentin, aleve Injections: no epidural steroid injections  Past Surgery: No spinal surgery  Tayelor Osborne has symptoms of cervical myelopathy.   The symptoms are causing a significant impact on the patient's life.   Review of Systems:  A 10 point review of systems is negative, except for the pertinent positives and negatives detailed in the HPI.  Past Medical History: Past Medical History:  Diagnosis Date   Allergy    Anemia    Anxiety    Arthritis    Cancer (HCC)    Cataract    Depression    Endometriosis      Past Surgical History: Past Surgical History:  Procedure Laterality Date   APPENDECTOMY     NASAL SEPTUM SURGERY     SMALL INTESTINE SURGERY      Allergies: Allergies as of 11/01/2022 - Review Complete 10/07/2022  Allergen Reaction Noted   Codeine Diarrhea and Nausea And Vomiting 05/27/2015    Medications: Outpatient Encounter Medications as of 11/01/2022  Medication Sig   alendronate (FOSAMAX) 70 MG tablet TAKE 1 TABLET EVERY 7 DAYS WITH A FULL GLASS OF WATER ON AN EMPTY STOMACH DO NOT LIE DOWN FOR AT LEAST 30 MIN   brexpiprazole (REXULTI) 1 MG TABS tablet Take 0.5 tablets (0.5 mg total) by mouth daily.   Calcium Carbonate-Vitamin D 600-400 MG-UNIT tablet Take 1 tablet by mouth daily.   Coenzyme Q10 (COQ-10) 100 MG capsule Take 100 mg by mouth daily.   diazepam (VALIUM) 5 MG tablet 1 po 30 minutes prior to MRI scan. May repeat x 1 right before MRI scan if needed. You will need a driver. (Patient not taking: Reported on 10/07/2022)   famotidine (PEPCID) 40 MG tablet TAKE ONE TABLET BY MOUTH AT BEDTIME   fluticasone (FLONASE) 50 MCG/ACT nasal spray Place 2 sprays into both nostrils daily.   gabapentin (NEURONTIN) 100 MG capsule TAKE 2 CAPSULES BY MOUTH TWICE DAILY.   Multiple Vitamin (MULTIVITAMIN WITH MINERALS) TABS tablet Take 1 tablet by mouth daily.   naproxen sodium (ALEVE) 220 MG tablet Take 220 mg by mouth daily as needed.  rosuvastatin (CRESTOR) 20 MG tablet Take 1 tablet (20 mg total) by mouth daily.   sertraline (ZOLOFT) 100 MG tablet TAKE ONE TABLET BY MOUTH EVERY DAY   No facility-administered encounter medications on file as of 11/01/2022.    Social History: Social History   Tobacco Use   Smoking status: Former   Smokeless tobacco: Never  Advertising account planner   Vaping status: Former  Substance Use Topics   Alcohol use: No   Drug use: No    Family Medical History: Family History  Problem Relation Age of Onset   Cancer Mother    Stroke Mother    COPD Mother     Heart failure Mother    Atrial fibrillation Mother    Heart disease Father    Cancer Maternal Grandmother    Diabetes Maternal Grandmother    Kidney disease Maternal Grandfather    Heart disease Maternal Grandfather    Heart disease Paternal Grandfather    Breast cancer Other        mggm    Physical Examination: There were no vitals filed for this visit.    Awake, alert, oriented to person, place, and time.  Speech is clear and fluent. Fund of knowledge is appropriate.   Cranial Nerves: Pupils equal round and reactive to light.  Facial tone is symmetric.    No posterior cervical tenderness. No tenderness in bilateral trapezial region.   No abnormal lesions on exposed skin.   Strength: Side Biceps Triceps Deltoid Interossei Grip Wrist Ext. Wrist Flex.  R 5 5 5 5 5 5 5   L 5 5 5 5 5 5 5    Side Iliopsoas Quads Hamstring PF DF EHL  R 5 5 5 5 5 5   L 5 5 5 5 5 5    Reflexes are 2+ and symmetric at the biceps, brachioradialis and 3+ at patella and achilles.   Hoffman's is absent.  Clonus is not present.   Bilateral upper and lower extremity sensation is intact to light touch.     Gait is slow and somewhat unsteady.   Medical Decision Making  Imaging: Cervical MRI dated 09/11/22:  FINDINGS: The study is motion degraded throughout, including severe motion on axial sequences despite attempts at repeat imaging.   Alignment: Trace anterolisthesis of C3 on C4.   Vertebrae: No fracture or suspicious marrow lesion. Severe disc space narrowing at C5-6 and C6-7 with predominantly chronic degenerative endplate changes.   Cord: No gross spinal cord signal abnormality is identified, however evaluation is significantly limited by the degree of motion artifact.   Posterior Fossa, vertebral arteries, paraspinal tissues: Unremarkable.   Disc levels:   C2-3: Mild uncovertebral spurring without sizable disc herniation or stenosis.   C3-4: Anterolisthesis with bulging uncovered  disc, a large left foraminal disc protrusion, and mild-to-moderate right and severe left facet arthrosis result in severe left neural foraminal stenosis and likely left C4 nerve root impingement. No significant spinal or right neural foraminal stenosis.   C4-5: Mild uncovertebral spurring and mild-to-moderate facet arthrosis without compressive stenosis.   C5-6: A broad-based posterior disc osteophyte complex and mild facet arthrosis result in mild-to-moderate spinal stenosis and moderate to severe right and severe left neural foraminal stenosis.   C6-7: A broad-based posterior disc osteophyte complex results in mild spinal stenosis and severe right and moderate left neural foraminal stenosis.   C7-T1: No compressive stenosis.   IMPRESSION: 1. Severely motion degraded examination. 2. Large left foraminal disc protrusion at C3-4 with severe left neural foraminal  stenosis. 3. Mild-to-moderate spinal stenosis and moderate to severe bilateral neural foraminal stenosis at C5-6. 4. Mild spinal stenosis and severe right and moderate left neural foraminal stenosis at C6-7.     Electronically Signed   By: Sebastian Ache M.D.   On: 09/25/2022 12:07  I have personally reviewed the images and agree with the above interpretation.  EMG of bilateral upper extremities on 10/12/22 showed:  Impression: Abnormal Study. There is electrodiagnostic evidence of chronic, moderate bilateral carpal tunnel syndrome.  Thank you for the referral of this patient. It was our privilege to participate in care of your patient. Feel free to contact us with any further questions.   _____________________________ Theora Master, MD    Assessment and Plan: Ms. Geerdes is a pleasant 67 y.o. female has constant right sided neck pain with some radiation to her shoulders. Pain on left side is better. She has constant numbness and tingling in left hand, intermittent in right hand. 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    She balance issues that are getting worse. She has dexterity issues in hands- may be due to numbness and/or arthritis.   She has known left sided disc C3-C4 with severe left foraminal stenosis, mild/moderate stenosis C5-C6 with moderate/severe right and severe left foraminal stenosis, and mild stenosis C6-C7 with moderate left/severe right foraminal stenosis.   Neck pain is likely from underlying spondylosis.   EMG shows moderate bilateral carpal tunnel syndrome.  Balance issues may be from cervical stenosis. Dexterity issues in hands are likely multifactorial- may be from stenosis, numbness in hands, and underlying hand arthritis.   Treatment options discussed with patient and following plan made:   - Referral to Dr. Katrinka Blazing to for evaluate of bilateral carpal tunnel syndrome.  - Will also get his opinion regarding her cervical stenosis. She has some balance and dexterity issues. Negative hoffmans. DTRs in lower extremities are 3+. - Cervical flexion/extension xrays ordered. He can review at her follow up.   I spent a total of 20 minutes in face-to-face and non-face-to-face activities related to this patient's care today including review of outside records, review of imaging, review of symptoms, physical exam, discussion of differential diagnosis, discussion of treatment options, and documentation.   Drake Leach PA-C Dept. of Neurosurgery

## 2022-11-01 ENCOUNTER — Ambulatory Visit
Admission: RE | Admit: 2022-11-01 | Discharge: 2022-11-01 | Disposition: A | Discharge status: Home / Self Care | Payer: Medicare Other | Attending: Orthopedic Surgery | Admitting: Orthopedic Surgery

## 2022-11-01 ENCOUNTER — Encounter: Payer: Self-pay | Admitting: Orthopedic Surgery

## 2022-11-01 ENCOUNTER — Ambulatory Visit (INDEPENDENT_AMBULATORY_CARE_PROVIDER_SITE_OTHER): Payer: Medicare Other | Admitting: Orthopedic Surgery

## 2022-11-01 ENCOUNTER — Ambulatory Visit
Admission: RE | Admit: 2022-11-01 | Discharge: 2022-11-01 | Disposition: A | Discharge status: Home / Self Care | Payer: Medicare Other | Source: Ambulatory Visit | Attending: Orthopedic Surgery | Admitting: Orthopedic Surgery

## 2022-11-01 VITALS — BP 106/70 | Ht 63.0 in | Wt 124.0 lb

## 2022-11-01 DIAGNOSIS — M5030 Other cervical disc degeneration, unspecified cervical region: Secondary | ICD-10-CM

## 2022-11-01 DIAGNOSIS — M47812 Spondylosis without myelopathy or radiculopathy, cervical region: Secondary | ICD-10-CM

## 2022-11-01 DIAGNOSIS — G5603 Carpal tunnel syndrome, bilateral upper limbs: Secondary | ICD-10-CM | POA: Diagnosis not present

## 2022-11-01 DIAGNOSIS — M4802 Spinal stenosis, cervical region: Secondary | ICD-10-CM | POA: Diagnosis not present

## 2022-11-01 DIAGNOSIS — M50322 Other cervical disc degeneration at C5-C6 level: Secondary | ICD-10-CM | POA: Diagnosis not present

## 2022-11-01 DIAGNOSIS — M4312 Spondylolisthesis, cervical region: Secondary | ICD-10-CM | POA: Diagnosis not present

## 2022-11-03 DIAGNOSIS — H903 Sensorineural hearing loss, bilateral: Secondary | ICD-10-CM | POA: Diagnosis not present

## 2022-11-08 NOTE — Progress Notes (Unsigned)
Referring Physician:  Ronnald Ramp, MD [address] [place],  [place] [address]  Primary Physician:  Ronnald Ramp, MD  History of Present Illness: 11/08/2022 Ms. Malan Werk is here today with a chief complaint of ***  Notes from Drake Leach, [place] 10/29/2022 Ms. Arah Aro has a history of osteoporosis, depression, GERD, hearing loss, hyperlipidemia.    Last seen by me on 07/28/22 for  3+ year history of intermittent neck pain with limited ROM. She has intermittent right > left sided neck pain that radiates into her shoulders. No pain in her arms, but she has constant tingling in her hands.    She has known cervical spondylosis and DDD C5-C7.    MRI of cervical spine was ordered along with an EMG. She is here to review them.    She has constant right sided neck pain with some radiation to her shoulders. Pain on left side is better. She has constant numbness and tingling in left hand, intermittent in right hand. 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    She balance issues that are getting worse. She has dexterity issues in hands- may be due to numbness and/or arthritis.    She is on neurontin- not sure it helps.    Bowel/Bladder Dysfunction: history of chronic urinary incontinence x 3 years- she does not have sensation to void. No bowel issues.    Conservative measures:  Physical therapy: initial eval for neck pain on 07/14/22- did 3 additional visits and stopped because it made her worse.  Multimodal medical therapy including regular antiinflammatories: gabapentin, aleve Injections: no epidural steroid injections   Past Surgery: No spinal surgery  [name] has ***no symptoms of cervical myelopathy.  The symptoms are causing a significant impact on the patient's life.   I have utilized the care everywhere function in epic to review the outside records available from external health systems.  Review of Systems:  A 10  point review of systems is negative, except for the pertinent positives and negatives detailed in the HPI.  Past Medical History: Past Medical History:  Diagnosis Date   Allergy    Anemia    Anxiety    Arthritis    Cancer (HCC)    Cataract    Depression    Endometriosis     Past Surgical History: Past Surgical History:  Procedure Laterality Date   APPENDECTOMY     NASAL SEPTUM SURGERY     SMALL INTESTINE SURGERY      Allergies: Allergies as of 11/10/2022 - Review Complete 11/01/2022  Allergen Reaction Noted   Codeine Diarrhea and Nausea And Vomiting 05/27/2015    Medications:  Current Outpatient Medications:    alendronate (FOSAMAX) 70 MG tablet, TAKE 1 TABLET EVERY 7 DAYS WITH A FULL GLASS OF WATER ON AN EMPTY STOMACH DO NOT LIE DOWN FOR AT LEAST 30 MIN, Disp: 12 tablet, Rfl: 1   brexpiprazole (REXULTI) 1 MG TABS tablet, Take 0.5 tablets (0.5 mg total) by mouth daily., Disp: 30 tablet, Rfl: 3   Calcium Carbonate-Vitamin D 600-400 MG-UNIT tablet, Take 1 tablet by mouth daily., Disp: , Rfl:    Coenzyme Q10 (COQ-10) 100 MG capsule, Take 100 mg by mouth daily., Disp: , Rfl:    diazepam (VALIUM) 5 MG tablet, 1 po 30 minutes prior to MRI scan. May repeat x 1 right before MRI scan if needed. You will need a driver. (Patient not taking: Reported on 10/07/2022), Disp: 2 tablet, Rfl: 0  famotidine (PEPCID) 40 MG tablet, TAKE ONE TABLET BY MOUTH AT BEDTIME, Disp: 30 tablet, Rfl: 6   fluticasone (FLONASE) 50 MCG/ACT nasal spray, Place 2 sprays into both nostrils daily., Disp: 16 g, Rfl: 6   gabapentin (NEURONTIN) 100 MG capsule, TAKE 2 CAPSULES BY MOUTH TWICE DAILY., Disp: 180 capsule, Rfl: 1   Multiple Vitamin (MULTIVITAMIN WITH MINERALS) TABS tablet, Take 1 tablet by mouth daily., Disp: , Rfl:    naproxen sodium (ALEVE) 220 MG tablet, Take 220 mg by mouth daily as needed., Disp: , Rfl:    rosuvastatin (CRESTOR) 20 MG tablet, Take 1 tablet (20 mg total) by mouth daily., Disp: 90  tablet, Rfl: 1   sertraline (ZOLOFT) 100 MG tablet, TAKE ONE TABLET BY MOUTH EVERY DAY, Disp: 90 tablet, Rfl: 1  Social History: Social History   Tobacco Use   Smoking status: Former   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  General: Patient is in no apparent distress. Attention to examination is appropriate.  Neck:   Supple.  Full range of motion.  Respiratory: Patient is breathing without any difficulty.   NEUROLOGICAL:     Awake, alert, oriented to person, place, and time.  Speech is clear and fluent.   Cranial Nerves: Pupils equal round and reactive to light.  Facial tone is symmetric.  Facial sensation is symmetric. Shoulder shrug is symmetric. Tongue protrusion is midline.    Strength: Side Biceps Triceps Deltoid Interossei Grip Wrist Ext. Wrist Flex.  R 5 5 5 5 5 5 5   L 5 5 5 5 5 5 5    Side Iliopsoas Quads Hamstring PF DF EHL  R 5 5 5 5 5 5   L 5 5 5 5 5 5    Reflexes are ***2+ and symmetric at the biceps, triceps, brachioradialis, patella and achilles.   Hoffman's is absent. Clonus is absent  Bilateral upper and lower extremity sensation is intact to light touch ***.     No evidence of dysmetria noted.  Gait is normal.    Imaging: *** I have personally reviewed the images and agree with the above interpretation.  Medical Decision Making/Assessment and Plan: [name] is a pleasant 67 y.o.  female with ***  There are no diagnoses linked to this encounter.   Thank you for involving me in the care of this patient.    Lovenia Kim MD/MSCR Neurosurgery

## 2022-11-08 NOTE — H&P (View-Only) (Signed)
Referring Physician:  Ronnald Ramp, MD [address] [place],  [place] [address]  Primary Physician:  Shari Ramp, MD  History of Present Illness: 11/08/2022 [name] is here today with a chief complaint of ***  Notes from Shari Rivera, [place] 10/29/2022 [name] has a history of osteoporosis, depression, GERD, hearing loss, hyperlipidemia.    Last seen by me on 07/28/22 for  3+ year history of intermittent neck pain with limited ROM. She has intermittent right > left sided neck pain that radiates into her shoulders. No pain in her arms, but she has constant tingling in her hands.    She has known cervical spondylosis and DDD C5-C7.    MRI of cervical spine was ordered along with an EMG. She is here to review them.    She has constant right sided neck pain with some radiation to her shoulders. Pain on left side is better. She has constant numbness and tingling in left hand, intermittent in right hand. 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    She balance issues that are getting worse. She has dexterity issues in hands- may be due to numbness and/or arthritis.    She is on neurontin- not sure it helps.    Bowel/Bladder Dysfunction: history of chronic urinary incontinence x 3 years- she does not have sensation to void. No bowel issues.    Conservative measures:  Physical therapy: initial eval for neck pain on 07/14/22- did 3 additional visits and stopped because it made her worse.  Multimodal medical therapy including regular antiinflammatories: gabapentin, aleve Injections: no epidural steroid injections   Past Surgery: No spinal surgery  Shari Rivera has ***no symptoms of cervical myelopathy.  The symptoms are causing a significant impact on the patient's life.   I have utilized the care everywhere function in epic to review the outside records available from external health systems.  Review of Systems:  A 10  point review of systems is negative, except for the pertinent positives and negatives detailed in the HPI.  Past Medical History: Past Medical History:  Diagnosis Date   Allergy    Anemia    Anxiety    Arthritis    Cancer (HCC)    Cataract    Depression    Endometriosis     Past Surgical History: Past Surgical History:  Procedure Laterality Date   APPENDECTOMY     NASAL SEPTUM SURGERY     SMALL INTESTINE SURGERY      Allergies: Allergies as of 11/10/2022 - Review Complete 11/01/2022  Allergen Reaction Noted   Codeine Diarrhea and Nausea And Vomiting 05/27/2015    Medications:  Current Outpatient Medications:    alendronate (FOSAMAX) 70 MG tablet, TAKE 1 TABLET EVERY 7 DAYS WITH A FULL GLASS OF WATER ON AN EMPTY STOMACH DO NOT LIE DOWN FOR AT LEAST 30 MIN, Disp: 12 tablet, Rfl: 1   brexpiprazole (REXULTI) 1 MG TABS tablet, Take 0.5 tablets (0.5 mg total) by mouth daily., Disp: 30 tablet, Rfl: 3   Calcium Carbonate-Vitamin D 600-400 MG-UNIT tablet, Take 1 tablet by mouth daily., Disp: , Rfl:    Coenzyme Q10 (COQ-10) 100 MG capsule, Take 100 mg by mouth daily., Disp: , Rfl:    diazepam (VALIUM) 5 MG tablet, 1 po 30 minutes prior to MRI scan. May repeat x 1 right before MRI scan if needed. You will need a driver. (Patient not taking: Reported on 10/07/2022), Disp: 2 tablet, Rfl: 0  famotidine (PEPCID) 40 MG tablet, TAKE ONE TABLET BY MOUTH AT BEDTIME, Disp: 30 tablet, Rfl: 6   fluticasone (FLONASE) 50 MCG/ACT nasal spray, Place 2 sprays into both nostrils daily., Disp: 16 g, Rfl: 6   gabapentin (NEURONTIN) 100 MG capsule, TAKE 2 CAPSULES BY MOUTH TWICE DAILY., Disp: 180 capsule, Rfl: 1   Multiple Vitamin (MULTIVITAMIN WITH MINERALS) TABS tablet, Take 1 tablet by mouth daily., Disp: , Rfl:    naproxen sodium (ALEVE) 220 MG tablet, Take 220 mg by mouth daily as needed., Disp: , Rfl:    rosuvastatin (CRESTOR) 20 MG tablet, Take 1 tablet (20 mg total) by mouth daily., Disp: 90  tablet, Rfl: 1   sertraline (ZOLOFT) 100 MG tablet, TAKE ONE TABLET BY MOUTH EVERY DAY, Disp: 90 tablet, Rfl: 1  Social History: Social History   Tobacco Use   Smoking status: Former   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  General: Patient is in no apparent distress. Attention to examination is appropriate.  Neck:   Supple.  Full range of motion.  Respiratory: Patient is breathing without any difficulty.   NEUROLOGICAL:     Awake, alert, oriented to person, place, and time.  Speech is clear and fluent.   Cranial Nerves: Pupils equal round and reactive to light.  Facial tone is symmetric.  Facial sensation is symmetric. Shoulder shrug is symmetric. Tongue protrusion is midline.    Strength: Side Biceps Triceps Deltoid Interossei Grip Wrist Ext. Wrist Flex.  R 5 5 5 5 5 5 5   L 5 5 5 5 5 5 5    Side Iliopsoas Quads Hamstring PF DF EHL  R 5 5 5 5 5 5   L 5 5 5 5 5 5    Reflexes are ***2+ and symmetric at the biceps, triceps, brachioradialis, patella and achilles.   Hoffman's is absent. Clonus is absent  Bilateral upper and lower extremity sensation is intact to light touch ***.     No evidence of dysmetria noted.  Gait is normal.    Imaging: *** I have personally reviewed the images and agree with the above interpretation.  Medical Decision Making/Assessment and Plan: [name] is a pleasant 67 y.o.  female with ***  There are no diagnoses linked to this encounter.   Thank you for involving me in the care of this patient.    Lovenia Kim MD/MSCR Neurosurgery

## 2022-11-10 ENCOUNTER — Encounter: Payer: Self-pay | Admitting: Neurosurgery

## 2022-11-10 ENCOUNTER — Ambulatory Visit (INDEPENDENT_AMBULATORY_CARE_PROVIDER_SITE_OTHER): Payer: Medicare Other | Admitting: Neurosurgery

## 2022-11-10 ENCOUNTER — Other Ambulatory Visit: Payer: Self-pay

## 2022-11-10 VITALS — BP 108/66 | Ht 63.0 in | Wt 123.0 lb

## 2022-11-10 DIAGNOSIS — Z01818 Encounter for other preprocedural examination: Secondary | ICD-10-CM

## 2022-11-10 DIAGNOSIS — G5603 Carpal tunnel syndrome, bilateral upper limbs: Secondary | ICD-10-CM | POA: Diagnosis not present

## 2022-11-10 NOTE — Patient Instructions (Signed)
Please see below for information in regards to your upcoming surgery:   Planned surgery: Left carpal tunnel release with ultrasound guidance   Surgery date: 11/30/22 at [facility] (Medical Mall: [address] [place], [place] [address]) - you will find out your arrival time the business day before your surgery.   Pre-op appointment at [facility] Pre-admit Testing: we will call you with a date/time for this. If you are scheduled for an in person appointment, Pre-admit Testing is located on the first floor of the [facility] building, [address], [address]. Please bring all prescriptions in the original prescription bottles to your appointment. During this appointment, they will advise you which medications you can take the morning of surgery, and which medications you will need to hold for surgery. Labs (such as blood work, EKG) may be done at your pre-op appointment. You are not required to fast for these labs. Should you need to change your pre-op appointment, please call Pre-admit testing at [phone].     Common restrictions after surgery: 10 pound lifting restrictions for 2 weeks after surgery   How to contact us:  If you have any questions/concerns before or after surgery, you can reach [place] at [phone], or you can send a mychart message. We can be reached by phone or mychart 8am-4pm, Monday-Friday.  *Please note: Calls after 4pm are forwarded to a third party answering service. Mychart messages are not routinely monitored during evenings, weekends, and holidays. Please call our office to contact the answering service for urgent concerns during non-business hours.    Appointments/FMLA & disability paperwork: Joycelyn Rua, & Flonnie Hailstone Registered Nurse/Surgery scheduler: Royston Cowper Medical Assistants: Nash Mantis Physician Assistants: Manning Charity, PA-C & Drake Leach, PA-C Surgeons: Venetia Night, MD & Ernestine Mcmurray, MD

## 2022-11-11 ENCOUNTER — Other Ambulatory Visit: Payer: Self-pay | Admitting: Family Medicine

## 2022-11-11 DIAGNOSIS — F3341 Major depressive disorder, recurrent, in partial remission: Secondary | ICD-10-CM

## 2022-11-11 NOTE — Telephone Encounter (Signed)
Requested medication (s) are due for refill today: Yes  Requested medication (s) are on the active medication list: Yes  Last refill:  08/17/22 #12, 1RF  Future visit scheduled: Yes  Notes to clinic:  Unable to refill per protocol due to failed labs, no updated results.      Requested Prescriptions  Pending Prescriptions Disp Refills   alendronate (FOSAMAX) 70 MG tablet [Pharmacy Med Name: ALENDRONATE SODIUM 70 MG TAB] 12 tablet 1    Sig: TAKE 1 TABLET EVERY 7 DAYS WITH A FULL GLASS OF WATER ON AN EMPTY STOMACH DO NOT LIE DOWN FOR AT LEAST 30 MIN     Endocrinology:  Bisphosphonates Failed - 11/11/2022  8:25 AM      Failed - Vitamin D in normal range and within 360 days    No results found for: "GN5621HY8", "MV7846NG2", "VD125OH2TOT", "25OHVITD3", "25OHVITD2", "25OHVITD1", "VD25OH"       Failed - Mg Level in normal range and within 360 days    No results found for: "MG"       Failed - Phosphate in normal range and within 360 days    No results found for: "PHOS"       Passed - Ca in normal range and within 360 days    Calcium  Date Value Ref Range Status  06/10/2022 9.4 8.7 - 10.3 mg/dL Final         Passed - Cr in normal range and within 360 days    Creatinine, Ser  Date Value Ref Range Status  06/10/2022 0.97 0.57 - 1.00 mg/dL Final         Passed - eGFR is 30 or above and within 360 days    GFR calc Af Amer  Date Value Ref Range Status  10/05/2017 >60 >60 mL/min Final    Comment:    (NOTE) The eGFR has been calculated using the CKD EPI equation. This calculation has not been validated in all clinical situations. eGFR's persistently <60 mL/min signify possible Chronic Kidney Disease.    GFR calc non Af Amer  Date Value Ref Range Status  10/05/2017 >60 >60 mL/min Final   eGFR  Date Value Ref Range Status  06/10/2022 64 >59 mL/min/1.73 Final         Passed - Valid encounter within last 12 months    Recent Outpatient Visits           1 month ago Need for  influenza vaccination   [place] [facility] Simmons-Robinson, [place], MD   4 months ago Recurrent major depressive disorder, in partial remission (HCC)   [facility] Simmons-Robinson, [place], MD   5 months ago Falls frequently   [facility] Simmons-Robinson, [place], MD   8 months ago Age-related osteoporosis without current pathological fracture   [place] [facility] Simmons-Robinson, [place], MD   9 months ago Age-related osteoporosis without current pathological fracture   [place] [facility] Simmons-Robinson, Tawanna Cooler, MD       Future Appointments             In 2 months Simmons-Robinson, Tawanna Cooler, MD [facility]            Passed - Bone Mineral Density or Dexa Scan completed in the last 2 years      Signed Prescriptions Disp Refills   sertraline (ZOLOFT) 100 MG tablet 90 tablet 1    Sig: TAKE ONE TABLET BY MOUTH EVERY DAY  Psychiatry:  Antidepressants - SSRI - sertraline Passed - 11/11/2022  8:25 AM      Passed - AST in normal range and within 360 days    AST  Date Value Ref Range Status  06/10/2022 26 0 - 40 IU/L Final         Passed - ALT in normal range and within 360 days    ALT  Date Value Ref Range Status  06/10/2022 27 0 - 32 IU/L Final         Passed - Completed PHQ-2 or PHQ-9 in the last 360 days      Passed - Valid encounter within last 6 months    Recent Outpatient Visits           1 month ago Need for influenza vaccination   [place] [facility] Simmons-Robinson, [place], MD   4 months ago Recurrent major depressive disorder, in partial remission (HCC)   [place] [facility] Simmons-Robinson, [place], MD   5 months ago Falls frequently   [place] [facility] Simmons-Robinson, [place], MD   8 months ago Age-related osteoporosis without current  pathological fracture   [place] [facility] Simmons-Robinson, [place], MD   9 months ago Age-related osteoporosis without current pathological fracture   [place] [facility] Simmons-Robinson, Tawanna Cooler, MD       Future Appointments             In 2 months Simmons-Robinson, Tawanna Cooler, MD [facility]

## 2022-11-11 NOTE — Telephone Encounter (Signed)
Requested Prescriptions  Pending Prescriptions Disp Refills   alendronate (FOSAMAX) 70 MG tablet [Pharmacy Med Name: ALENDRONATE SODIUM 70 MG TAB] 12 tablet 1    Sig: TAKE 1 TABLET EVERY 7 DAYS WITH A FULL GLASS OF WATER ON AN EMPTY STOMACH DO NOT LIE DOWN FOR AT LEAST 30 MIN     Endocrinology:  Bisphosphonates Failed - 11/11/2022  8:25 AM      Failed - Vitamin D in normal range and within 360 days    No results found for: "NW2956OZ3", "YQ6578IO9", "VD125OH2TOT", "25OHVITD3", "25OHVITD2", "25OHVITD1", "VD25OH"       Failed - Mg Level in normal range and within 360 days    No results found for: "MG"       Failed - Phosphate in normal range and within 360 days    No results found for: "PHOS"       Passed - Ca in normal range and within 360 days    Calcium  Date Value Ref Range Status  06/10/2022 9.4 8.7 - 10.3 mg/dL Final         Passed - Cr in normal range and within 360 days    Creatinine, Ser  Date Value Ref Range Status  06/10/2022 0.97 0.57 - 1.00 mg/dL Final         Passed - eGFR is 30 or above and within 360 days    GFR calc Af Amer  Date Value Ref Range Status  10/05/2017 >60 >60 mL/min Final    Comment:    (NOTE) The eGFR has been calculated using the CKD EPI equation. This calculation has not been validated in all clinical situations. eGFR's persistently <60 mL/min signify possible Chronic Kidney Disease.    GFR calc non Af Amer  Date Value Ref Range Status  10/05/2017 >60 >60 mL/min Final   eGFR  Date Value Ref Range Status  06/10/2022 64 >59 mL/min/1.73 Final         Passed - Valid encounter within last 12 months    Recent Outpatient Visits           1 month ago Need for influenza vaccination   [place] [facility] Simmons-Robinson, [place], MD   4 months ago Recurrent major depressive disorder, in partial remission (HCC)   [place] [facility] Simmons-Robinson, [place], MD   5 months ago Falls frequently   [facility] Simmons-Robinson, [place], MD   8 months ago Age-related osteoporosis without current pathological fracture   [place] [facility] Simmons-Robinson, [place], MD   9 months ago Age-related osteoporosis without current pathological fracture   [place] [facility] Simmons-Robinson, Tawanna Cooler, MD       Future Appointments             In 2 months Simmons-Robinson, Tawanna Cooler, MD [facility]            Passed - Bone Mineral Density or Dexa Scan completed in the last 2 years       sertraline (ZOLOFT) 100 MG tablet [Pharmacy Med Name: SERTRALINE HCL 100 MG TAB] 90 tablet 1    Sig: TAKE ONE TABLET BY MOUTH EVERY DAY     Psychiatry:  Antidepressants - SSRI - sertraline Passed - 11/11/2022  8:25 AM      Passed - AST in normal range and within 360 days    AST  Date Value Ref Range Status  06/10/2022 26 0 - 40 IU/L Final  Passed - ALT in normal range and within 360 days    ALT  Date Value Ref Range Status  06/10/2022 27 0 - 32 IU/L Final         Passed - Completed PHQ-2 or PHQ-9 in the last 360 days      Passed - Valid encounter within last 6 months    Recent Outpatient Visits           1 month ago Need for influenza vaccination   [place] [facility] Simmons-Robinson, [place], MD   4 months ago Recurrent major depressive disorder, in partial remission (HCC)   [place] [facility] Simmons-Robinson, [place], MD   5 months ago Falls frequently   [facility] Simmons-Robinson, [place], MD   8 months ago Age-related osteoporosis without current pathological fracture   [facility] [facility] Simmons-Robinson, [place], MD   9 months ago Age-related osteoporosis without current pathological fracture    [facility] Simmons-Robinson, Tawanna Cooler, MD       Future Appointments              In 2 months Simmons-Robinson, Tawanna Cooler, MD [place]

## 2022-11-19 ENCOUNTER — Encounter
Admission: RE | Admit: 2022-11-19 | Discharge: 2022-11-19 | Disposition: A | Discharge status: Home / Self Care | Payer: Medicare Other | Source: Ambulatory Visit | Attending: Neurosurgery | Admitting: Neurosurgery

## 2022-11-19 ENCOUNTER — Other Ambulatory Visit: Payer: Self-pay

## 2022-11-19 VITALS — BP 121/70 | Resp 16 | Ht 63.0 in | Wt 126.5 lb

## 2022-11-19 DIAGNOSIS — D649 Anemia, unspecified: Secondary | ICD-10-CM

## 2022-11-19 DIAGNOSIS — R9431 Abnormal electrocardiogram [ECG] [EKG]: Secondary | ICD-10-CM | POA: Insufficient documentation

## 2022-11-19 DIAGNOSIS — R0602 Shortness of breath: Secondary | ICD-10-CM | POA: Diagnosis not present

## 2022-11-19 DIAGNOSIS — Z01818 Encounter for other preprocedural examination: Secondary | ICD-10-CM | POA: Insufficient documentation

## 2022-11-19 DIAGNOSIS — Z01812 Encounter for preprocedural laboratory examination: Secondary | ICD-10-CM

## 2022-11-19 HISTORY — DX: COVID-19: U07.1

## 2022-11-19 HISTORY — DX: Carpal tunnel syndrome, bilateral upper limbs: G56.03

## 2022-11-19 HISTORY — DX: Dyspnea, unspecified: R06.00

## 2022-11-19 HISTORY — DX: Prediabetes: R73.03

## 2022-11-19 HISTORY — DX: Gastro-esophageal reflux disease without esophagitis: K21.9

## 2022-11-19 HISTORY — DX: Family history of other specified conditions: Z84.89

## 2022-11-19 HISTORY — DX: Pneumonia, unspecified organism: J18.9

## 2022-11-19 LAB — BASIC METABOLIC PANEL
Anion gap: 6 (ref 5–15)
BUN: 14 mg/dL (ref 8–23)
CO2: 27 mmol/L (ref 22–32)
Calcium: 9.5 mg/dL (ref 8.9–10.3)
Chloride: 106 mmol/L (ref 98–111)
Creatinine, Ser: 1.01 mg/dL — ABNORMAL HIGH (ref 0.44–1.00)
GFR, Estimated: >60 mL/min (ref >60)
Glucose, Bld: 122 mg/dL — ABNORMAL HIGH (ref 70–99)
Potassium: 3.6 mmol/L (ref 3.5–5.1)
Sodium: 139 mmol/L (ref 135–145)

## 2022-11-19 LAB — CBC
HCT: 37.2 % (ref 36.0–46.0)
Hemoglobin: 12 g/dL (ref 12.0–15.0)
MCH: 30.8 pg (ref 26.0–34.0)
MCHC: 32.3 g/dL (ref 30.0–36.0)
MCV: 95.4 fL (ref 80.0–100.0)
Platelets: 217 10*3/uL (ref 150–400)
RBC: 3.9 MIL/uL (ref 3.87–5.11)
RDW: 13.1 % (ref 11.5–15.5)
WBC: 6.6 10*3/uL (ref 4.0–10.5)
nRBC: 0 % (ref 0.0–0.2)

## 2022-11-19 NOTE — Patient Instructions (Addendum)
Your procedure is scheduled on: 11/30/22 - Tuesday Report to the Registration Desk on the 1st floor of the [facility]. To find out your arrival time, please call [phone] between 1PM - 3PM on: 11/29/22 - Monday If your arrival time is 6:00 am, do not arrive before that time as the [facility] entrance doors do not open until 6:00 am.  REMEMBER: Instructions that are not followed completely may result in serious medical risk, up to and including death; or upon the discretion of your surgeon and anesthesiologist your surgery may need to be rescheduled.  Do not eat food after midnight the night before surgery.  No gum chewing or hard candies.  You may however, drink CLEAR liquids up to 2 hours before you are scheduled to arrive for your surgery. Do not drink anything within 2 hours of your scheduled arrival time.  Clear liquids include: - water  - apple juice without pulp - gatorade (not RED colors) - black coffee or tea (Do NOT add milk or creamers to the coffee or tea) Do NOT drink anything that is not on this list.   One week prior to surgery beginning 11/23/22 :  You may continue  Anti-inflammatories (NSAIDS) such as Advil, Aleve, Ibuprofen, Motrin, Naproxen, Naprosyn and Aspirin based products such as Excedrin, Goody's Powder, BC Powder. You may take Tylenol if needed for pain up until the day of surgery.  Stop ANY OVER THE COUNTER supplements until after surgery.  ON THE DAY OF SURGERY ONLY TAKE THESE MEDICATIONS WITH SIPS OF WATER:  Brexpiprazole (REXULTI)  fluticasone (FLONASE)  gabapentin (NEURONTIN)  sertraline (ZOLOFT)  famotidine (PEPCID)    No Alcohol for 24 hours before or after surgery.  No Smoking including e-cigarettes for 24 hours before surgery.  No chewable tobacco products for at least 6 hours before surgery.  No nicotine patches on the day of surgery.  Do not use any "recreational" drugs for at least a week (preferably 2 weeks) before your  surgery.  Please be advised that the combination of cocaine and anesthesia may have negative outcomes, up to and including death. If you test positive for cocaine, your surgery will be cancelled.  On the morning of surgery brush your teeth with toothpaste and water, you may rinse your mouth with mouthwash if you wish. Do not swallow any toothpaste or mouthwash.  Use CHG Soap or wipes as directed on instruction sheet.  Do not wear jewelry, make-up, hairpins, clips or nail polish.  For welded (permanent) jewelry: bracelets, anklets, waist bands, etc.  Please have this removed prior to surgery.  If it is not removed, there is a chance that hospital personnel will need to cut it off on the day of surgery.  Do not wear lotions, powders, or perfumes.   Do not shave body hair from the neck down 48 hours before surgery.  Contact lenses, hearing aids and dentures may not be worn into surgery.  Do not bring valuables to the hospital. [facility] is not responsible for any missing/lost belongings or valuables.   Notify your doctor if there is any change in your medical condition (cold, fever, infection).  Wear comfortable clothing (specific to your surgery type) to the hospital.  After surgery, you can help prevent lung complications by doing breathing exercises.  Take deep breaths and cough every 1-2 hours. Your doctor may order a device called an Incentive Spirometer to help you take deep breaths. When coughing or sneezing, hold a pillow firmly against your  incision with both hands. This is called "splinting." Doing this helps protect your incision. It also decreases belly discomfort.  If you are being admitted to the hospital overnight, leave your suitcase in the car. After surgery it may be brought to your room.  In case of increased patient census, it may be necessary for you, the patient, to continue your postoperative care in the Same Day Surgery department.  If you are being discharged  the day of surgery, you will not be allowed to drive home. You will need a responsible individual to drive you home and stay with you for 24 hours after surgery.   If you are taking public transportation, you will need to have a responsible individual with you.  Please call the Pre-admissions Testing Dept. at [phone] if you have any questions about these instructions.  Surgery Visitation Policy:  Patients having surgery or a procedure may have two visitors.  Children under the age of 79 must have an adult with them who is not the patient.  Inpatient Visitation:    Visiting hours are 7 a.m. to 8 p.m. Up to four visitors are allowed at one time in a patient room. The visitors may rotate out with other people during the day.  One visitor age 39 or older may stay with the patient overnight and must be in the room by 8 p.m.     Preparing for Surgery with CHLORHEXIDINE GLUCONATE (CHG) Soap  Chlorhexidine Gluconate (CHG) Soap  o An antiseptic cleaner that kills germs and bonds with the skin to continue killing germs even after washing  o Used for showering the night before surgery and morning of surgery  Before surgery, you can play an important role by reducing the number of germs on your skin.  CHG (Chlorhexidine gluconate) soap is an antiseptic cleanser which kills germs and bonds with the skin to continue killing germs even after washing.  Please do not use if you have an allergy to CHG or antibacterial soaps. If your skin becomes reddened/irritated stop using the CHG.  1. Shower the NIGHT BEFORE SURGERY and the MORNING OF SURGERY with CHG soap.  2. If you choose to wash your hair, wash your hair first as usual with your normal shampoo.  3. After shampooing, rinse your hair and body thoroughly to remove the shampoo.  4. Use CHG as you would any other liquid soap. You can apply CHG directly to the skin and wash gently with a scrungie or a clean washcloth.  5. Apply the  CHG soap to your body only from the neck down. Do not use on open wounds or open sores. Avoid contact with your eyes, ears, mouth, and genitals (private parts). Wash face and genitals (private parts) with your normal soap.  6. Wash thoroughly, paying special attention to the area where your surgery will be performed.  7. Thoroughly rinse your body with warm water.  8. Do not shower/wash with your normal soap after using and rinsing off the CHG soap.  9. Pat yourself dry with a clean towel.  10. Wear clean pajamas to bed the night before surgery.  12. Place clean sheets on your bed the night of your first shower and do not sleep with pets.  13. Shower again with the CHG soap on the day of surgery prior to arriving at the hospital.  14. Do not apply any deodorants/lotions/powders.  15. Please wear clean clothes to the hospital.

## 2022-11-22 ENCOUNTER — Telehealth: Payer: Self-pay

## 2022-11-22 DIAGNOSIS — E785 Hyperlipidemia, unspecified: Secondary | ICD-10-CM

## 2022-11-22 NOTE — Telephone Encounter (Signed)
Copied from CRM [id number]. Topic: General - Other >> Nov 22, 2022 11:07 AM Everette C wrote: Reason for CRM: The patient has been in contact with their pharmacy and been directed to follow up on previously made refill requests of rosuvastatin (CRESTOR) 20 MG tablet [[id number]]  Please contact further when possible

## 2022-11-23 ENCOUNTER — Other Ambulatory Visit: Payer: Medicare Other

## 2022-11-24 ENCOUNTER — Encounter: Payer: Self-pay | Admitting: Orthopedic Surgery

## 2022-11-24 MED ORDER — ROSUVASTATIN CALCIUM 20 MG PO TABS: 20.0000 mg | ORAL_TABLET | Freq: Every day | ORAL | 1 refills | Status: DC

## 2022-11-24 NOTE — Telephone Encounter (Signed)
refilled 

## 2022-11-24 NOTE — Progress Notes (Signed)
Cervical xrays dated 11/01/22:  FINDINGS: Similar moderately severe degenerative disc disease at multiple levels most pronounced at C5-6 and C6-7 where there is marked disc space narrowing, sclerosis and endplate osteophytes. Stable alignment and prevertebral soft tissues. Mild multilevel posterior facet arthropathy. No acute osseous finding or fracture. Minimal grade 1 anterolisthesis of C3 on C4 and C4 on C5 with flexion and resolves with extension.   IMPRESSION: 1. Multilevel cervical spondylosis as above. 2. Minimal grade 1 anterolisthesis of C3 on C4 and C4 on C5 with flexion and resolves with extension.     Electronically Signed   By: Judie Petit.  Shick M.D.   On: 11/23/2022 16:54   I have personally reviewed the images and agree with the above interpretation.  Above slip at C3-C4 and C4-C5 show only movement of about 1 mm. Will follow for now and see how she does after her carpal tunnel release.

## 2022-11-29 MED ORDER — LACTATED RINGERS IV SOLN: INTRAVENOUS | Status: DC

## 2022-11-29 MED ORDER — CHLORHEXIDINE GLUCONATE 0.12 % MT SOLN
15.0000 mL | Freq: Once | OROMUCOSAL | Status: AC
  Administered 2022-11-30: 15 mL via OROMUCOSAL

## 2022-11-29 MED ORDER — CEFAZOLIN SODIUM-DEXTROSE 2-4 GM/100ML-% IV SOLN: 2.0000 g | INTRAVENOUS | Status: DC

## 2022-11-29 MED ORDER — ORAL CARE MOUTH RINSE: 15.0000 mL | Freq: Once | OROMUCOSAL | Status: AC

## 2022-11-29 MED ORDER — CEFAZOLIN IN SODIUM CHLORIDE 2-0.9 GM/100ML-% IV SOLN
2.0000 g | Freq: Once | INTRAVENOUS | Status: DC
  Filled 2022-11-29: qty 100

## 2022-11-30 ENCOUNTER — Ambulatory Visit: Payer: Medicare Other | Admitting: Urgent Care

## 2022-11-30 ENCOUNTER — Encounter: Payer: Self-pay | Admitting: Neurosurgery

## 2022-11-30 ENCOUNTER — Encounter
Admission: RE | Disposition: A | Discharge status: Home / Self Care | Payer: Self-pay | Source: Home / Self Care | Attending: Neurosurgery

## 2022-11-30 ENCOUNTER — Ambulatory Visit
Admission: RE | Admit: 2022-11-30 | Discharge: 2022-11-30 | Disposition: A | Discharge status: Home / Self Care | Payer: Medicare Other | Attending: Neurosurgery | Admitting: Neurosurgery

## 2022-11-30 ENCOUNTER — Other Ambulatory Visit: Payer: Self-pay

## 2022-11-30 ENCOUNTER — Telehealth: Payer: Self-pay

## 2022-11-30 DIAGNOSIS — R7303 Prediabetes: Secondary | ICD-10-CM | POA: Diagnosis not present

## 2022-11-30 DIAGNOSIS — G5602 Carpal tunnel syndrome, left upper limb: Secondary | ICD-10-CM | POA: Diagnosis not present

## 2022-11-30 DIAGNOSIS — E785 Hyperlipidemia, unspecified: Secondary | ICD-10-CM

## 2022-11-30 DIAGNOSIS — Z01812 Encounter for preprocedural laboratory examination: Secondary | ICD-10-CM

## 2022-11-30 DIAGNOSIS — Z87891 Personal history of nicotine dependence: Secondary | ICD-10-CM | POA: Diagnosis not present

## 2022-11-30 DIAGNOSIS — K219 Gastro-esophageal reflux disease without esophagitis: Secondary | ICD-10-CM | POA: Diagnosis not present

## 2022-11-30 DIAGNOSIS — F419 Anxiety disorder, unspecified: Secondary | ICD-10-CM | POA: Diagnosis not present

## 2022-11-30 DIAGNOSIS — Z01818 Encounter for other preprocedural examination: Secondary | ICD-10-CM

## 2022-11-30 HISTORY — PX: CARPAL TUNNEL RELEASE: SHX101

## 2022-11-30 SURGERY — CARPAL TUNNEL RELEASE
Anesthesia: General | Site: Wrist | Laterality: Left

## 2022-11-30 MED ORDER — CEFAZOLIN SODIUM-DEXTROSE 2-4 GM/100ML-% IV SOLN
INTRAVENOUS | Status: AC
Start: 2022-11-30 — End: ?
  Filled 2022-11-30: qty 100

## 2022-11-30 MED ORDER — MIDAZOLAM HCL 2 MG/2ML IJ SOLN
INTRAMUSCULAR | Status: DC | PRN
  Administered 2022-11-30: 2 mg via INTRAVENOUS

## 2022-11-30 MED ORDER — FENTANYL CITRATE (PF) 100 MCG/2ML IJ SOLN: 25.0000 ug | INTRAMUSCULAR | Status: DC | PRN

## 2022-11-30 MED ORDER — CEFAZOLIN SODIUM-DEXTROSE 2-4 GM/100ML-% IV SOLN
2.0000 g | Freq: Once | INTRAVENOUS | Status: AC
  Administered 2022-11-30: 2 g via INTRAVENOUS

## 2022-11-30 MED ORDER — PROPOFOL 10 MG/ML IV BOLUS
INTRAVENOUS | Status: AC
Start: 2022-11-30 — End: ?
  Filled 2022-11-30: qty 20

## 2022-11-30 MED ORDER — 0.9 % SODIUM CHLORIDE (POUR BTL) OPTIME
TOPICAL | Status: DC | PRN
  Administered 2022-11-30: 500 mL

## 2022-11-30 MED ORDER — HYDROCODONE-ACETAMINOPHEN 5-325 MG PO TABS: 1.0000 | ORAL_TABLET | ORAL | 0 refills | Status: AC | PRN

## 2022-11-30 MED ORDER — BUPIVACAINE HCL (PF) 0.5 % IJ SOLN
INTRAMUSCULAR | Status: AC
  Filled 2022-11-30: qty 30

## 2022-11-30 MED ORDER — KETOROLAC TROMETHAMINE 30 MG/ML IJ SOLN
INTRAMUSCULAR | Status: DC | PRN
  Administered 2022-11-30: 15 mg via INTRAVENOUS

## 2022-11-30 MED ORDER — LIDOCAINE HCL (PF) 2 % IJ SOLN
INTRAMUSCULAR | Status: AC
  Filled 2022-11-30: qty 5

## 2022-11-30 MED ORDER — LIDOCAINE HCL (CARDIAC) PF 100 MG/5ML IV SOSY
PREFILLED_SYRINGE | INTRAVENOUS | Status: DC | PRN
  Administered 2022-11-30: 100 mg via INTRAVENOUS

## 2022-11-30 MED ORDER — KETOROLAC TROMETHAMINE 30 MG/ML IJ SOLN
INTRAMUSCULAR | Status: AC
  Filled 2022-11-30: qty 1

## 2022-11-30 MED ORDER — PROPOFOL 10 MG/ML IV BOLUS
INTRAVENOUS | Status: AC
  Filled 2022-11-30: qty 20

## 2022-11-30 MED ORDER — BUPIVACAINE HCL 0.5 % IJ SOLN
INTRAMUSCULAR | Status: DC | PRN
  Administered 2022-11-30: 5 mL

## 2022-11-30 MED ORDER — PROPOFOL 500 MG/50ML IV EMUL
INTRAVENOUS | Status: DC | PRN
  Administered 2022-11-30: 160 ug/kg/min via INTRAVENOUS
  Administered 2022-11-30: 30 mg via INTRAVENOUS
  Administered 2022-11-30: 20 mg via INTRAVENOUS

## 2022-11-30 MED ORDER — DEXAMETHASONE SODIUM PHOSPHATE 10 MG/ML IJ SOLN
INTRAMUSCULAR | Status: AC
Start: 2022-11-30 — End: ?
  Filled 2022-11-30: qty 1

## 2022-11-30 MED ORDER — CHLORHEXIDINE GLUCONATE 0.12 % MT SOLN
OROMUCOSAL | Status: AC
  Filled 2022-11-30: qty 15

## 2022-11-30 MED ORDER — MIDAZOLAM HCL 2 MG/2ML IJ SOLN
INTRAMUSCULAR | Status: AC
  Filled 2022-11-30: qty 2

## 2022-11-30 SURGICAL SUPPLY — 25 items
ADH SKN CLS APL DERMABOND .7 (GAUZE/BANDAGES/DRESSINGS)
APL PRP STRL LF DISP 70% ISPRP (MISCELLANEOUS) ×2
BNDG ADH 1X3 SHEER STRL LF (GAUZE/BANDAGES/DRESSINGS) ×1 IMPLANT
BNDG ADH 2 X3.75 FABRIC TAN LF (GAUZE/BANDAGES/DRESSINGS) ×1 IMPLANT
BNDG ADH THN 3X1 STRL LF (GAUZE/BANDAGES/DRESSINGS) ×1
BNDG ADH XL 3.75X2 STRCH LF (GAUZE/BANDAGES/DRESSINGS) ×1
BRUSH SCRUB EZ 4% CHG (MISCELLANEOUS) ×1 IMPLANT
CHLORAPREP W/TINT 26 (MISCELLANEOUS) ×2 IMPLANT
COVER PROBE FLX POLY STRL (MISCELLANEOUS) ×1 IMPLANT
DERMABOND ADVANCED .7 DNX12 (GAUZE/BANDAGES/DRESSINGS) IMPLANT
DRAPE 3/4 80X56 (DRAPES) ×1 IMPLANT
GAUZE SPONGE 4X4 12PLY STRL (GAUZE/BANDAGES/DRESSINGS) ×2 IMPLANT
GLOVE SRG 8 PF TXTR STRL LF DI (GLOVE) ×1 IMPLANT
GLOVE SURG SYN 7.5 E (GLOVE) ×1
GLOVE SURG SYN 7.5 PF PI (GLOVE) ×1 IMPLANT
GLOVE SURG UNDER POLY LF SZ8 (GLOVE) ×1
GOWN SRG XL LVL 3 NONREINFORCE (GOWNS) ×1 IMPLANT
GOWN STRL NON-REIN TWL XL LVL3 (GOWNS) ×1
KIT TURNOVER KIT A (KITS) ×1 IMPLANT
MANIFOLD NEPTUNE II (INSTRUMENTS) ×1 IMPLANT
NS IRRIG 500ML POUR BTL (IV SOLUTION) ×1 IMPLANT
PAD ARMBOARD 7.5X6 YLW CONV (MISCELLANEOUS) ×1 IMPLANT
TOWEL OR 17X26 4PK STRL BLUE (TOWEL DISPOSABLE) ×1 IMPLANT
ULTRAGLIDE CTR (BLADE) ×1 IMPLANT
WATER STERILE IRR 500ML POUR (IV SOLUTION) ×1 IMPLANT

## 2022-11-30 NOTE — Transfer of Care (Signed)
Immediate Anesthesia Transfer of Care Note  Patient: Shari Rivera  Procedure(s) Performed: LEFT CARPAL TUNNEL RELEASE WITH ULTRASOUND GUIDANCE (Left: Wrist)  Patient Location: PACU  Anesthesia Type:General  Level of Consciousness: awake and drowsy  Airway & Oxygen Therapy: Patient Spontanous Breathing and Patient connected to face mask oxygen  Post-op Assessment: Report given to RN and Post -op Vital signs reviewed and stable  Post vital signs: Reviewed and stable  Last Vitals:  Vitals Value Taken Time  BP 108/65 11/30/22 0918  Temp 35.8 0918  Pulse 72 0918  Resp 23 11/30/22 0923  SpO2 98 0919  Vitals shown include unfiled device data.  Last Pain:  Vitals:   11/30/22 0740  TempSrc: Temporal  PainSc: 0-No pain         Complications: No notable events documented.

## 2022-11-30 NOTE — Telephone Encounter (Signed)
Copied from CRM [id number]. Topic: General - Other >> Nov 30, 2022 11:50 AM Franchot Heidelberg wrote: Reason for CRM: Genella Rife Nurse Case Manager from [employer] called to formally invite the patient's PCP to go on the portal and review the care plan.   "[facility] the same portal that they submit their payment for bills"  Best contact: says she cannot help but all of the information is on the portal.

## 2022-11-30 NOTE — Anesthesia Preprocedure Evaluation (Signed)
Anesthesia Evaluation  Patient identified by MRN, date of birth, ID band Patient awake    Reviewed: Allergy & Precautions, H&P , NPO status , Patient's Chart, lab work & pertinent test results, reviewed documented beta blocker date and time   History of Anesthesia Complications Negative for: history of anesthetic complications  Airway Mallampati: I  TM Distance: >3 FB Neck ROM: full    Dental  (+) Dental Advidsory Given, Edentulous Upper, Edentulous Lower   Pulmonary neg pulmonary ROS, Continuous Positive Airway Pressure Ventilation , former smoker   Pulmonary exam normal breath sounds clear to auscultation       Cardiovascular Exercise Tolerance: Good negative cardio ROS Normal cardiovascular exam Rhythm:regular Rate:Normal     Neuro/Psych neg Seizures PSYCHIATRIC DISORDERS Anxiety Depression     Neuromuscular disease    GI/Hepatic Neg liver ROS,GERD  ,,  Endo/Other  diabetes (borderline)    Renal/GU negative Renal ROS  negative genitourinary   Musculoskeletal   Abdominal   Peds  Hematology  (+) Blood dyscrasia, anemia   Anesthesia Other Findings Past Medical History: No date: Allergy No date: Anemia No date: Anxiety No date: Arthritis No date: Cancer (HCC) No date: Carpal tunnel syndrome, bilateral No date: Cataract No date: COVID-19 No date: Depression No date: Dyspnea     Comment:  former smoker No date: Endometriosis No date: Family history of adverse reaction to anesthesia     Comment:  Mother difficult to wake up No date: GERD (gastroesophageal reflux disease) No date: Pneumonia No date: Pre-diabetes   Reproductive/Obstetrics negative OB ROS                             Anesthesia Physical Anesthesia Plan  ASA: 2  Anesthesia Plan: General   Post-op Pain Management:    Induction: Intravenous  PONV Risk Score and Plan: 3 and Propofol infusion and TIVA  Airway  Management Planned: Natural Airway and Simple Face Mask  Additional Equipment:   Intra-op Plan:   Post-operative Plan:   Informed Consent: I have reviewed the patients History and Physical, chart, labs and discussed the procedure including the risks, benefits and alternatives for the proposed anesthesia with the patient or authorized representative who has indicated his/her understanding and acceptance.     Dental Advisory Given  Plan Discussed with: Anesthesiologist, CRNA and Surgeon  Anesthesia Plan Comments:         Anesthesia Quick Evaluation

## 2022-11-30 NOTE — Op Note (Signed)
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  Findings: Severe compression of the median nerve at the transverse carpal ligament  Preoperative Diagnosis: Hospital Problem List as of 11/30/2022          Priority Resolved POA     Unprioritized     * (Principal) Carpal tunnel syndrome on left   Yes     Postoperative Diagnosis: same   EBL: Minimal IVF: See anesthesia report Drains: none Disposition:Stable to PACU Complications: none  No foley catheter was placed.   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  They had tried rest, padding, and watchful waiting but had continued progressive symptoms.  Given the progression of her median neuropathy plan was made for median nerve decompression  Risk of surgery is discussed and include: Infection, bleeding, wound healing issues, pillar pain nerve injury, pain, failure to relieve the symptoms, need for further surgery.  Procedure:  1) left sided carpal tunnel decompression with ultrasound guidance   Procedure: After obtaining informed consent, the patient taken to the operating room, placed in supine position, monitored anesthesia care was induced.  They were given preoperative antibiotics.  Prepped and draped in the usual fashion.  Comprehensive timeout was performed verifying the patient's name, MRN, planned procedure.  An ultrasound was used with a sterile probe.  We used this to mark out our safety points including the interval between the ulnar artery and median nerve.  We identified the motor branch as well as the first sensory branch which were both in safe position.  We also identified the palmar arcade which was in safe positioning as well.  At this point we placed a block with Marcaine without epinephrine.  We blocked the skin where the incision  would be, the superficial sensory median nerve, as well as the transverse carpal ligament.  Under ultrasound guidance we utilized the local anesthetic to perform a hydrodissection of the nerve from the transverse carpal ligament.  We then prepped the sonics ultra CTR knife on the back table while the anesthetic set in.  We performed a small linear incision approximately 2 to 3 mm.  We then utilized a Statistician under ultrasound guidance to identify the underside of the transverse carpal ligament.  The fat and connective tissue was dissected off the underside.  We could feel that this was quite thickened and calcified.  Causing severe compression.  Once we had a clear tract we then placed the ultrasound-guided knife into the incision and advanced it through the carpal tunnel.  We verified the safety zones including the median nerve which did not significantly cross over the knife.  We are able to see the first sensory branch which was not crossing the knife.  The artery was also in a safe place.  At this point we divided the transverse carpal ligament under ultrasound guidance, to get a full release it took 2 passes.  The nerve relaxed laterally and was well decompressed.  After the 2 passes we placed a [place] 4 into the wound and were able to feel a complete dissection of the transverse carpal ligament.  We then irrigated, we got meticulous hemostasis.  Skin glue was placed on the incision and a Band-Aid was placed on top once this was dried.  No immediate complications.  Sponge and pattie counts were correct at the end of the procedure.  I performed the procedure without an assistant surgeon  Lovenia Kim, MD/MSCR

## 2022-11-30 NOTE — Discharge Summary (Signed)
Physician Discharge Summary  Patient ID: Shari Rivera MRN: [id number] DOB/AGE: 1955/11/22 67 y.o.  Admit date: 11/30/2022 Discharge date: 11/30/2022  Admission Diagnoses:  Discharge Diagnoses:  Principal Problem:   Carpal tunnel syndrome on left   Discharged Condition: good  Hospital Course: Patient underwent an carpal tunnel release with ultrasound guidance.  Overall the procedure went well.  No immediate complications.  Continue to have median function postoperatively.  Consults: None  Significant Diagnostic Studies: None  Treatments: surgery: Carpal tunnel release with ultrasound guidance  Discharge Exam: Blood pressure 128/70, pulse 69, temperature 97.6 F (36.4 C), temperature source Temporal, resp. rate 16, SpO2 97%. Continues to have median motor function, hand sensory function including palm is decreased secondary to the local anesthetic block  Disposition: Discharge disposition: 01-Home or Self Care       Discharge Instructions     Diet - low sodium heart healthy   Complete by: As directed    Discharge instructions   Complete by: As directed    Your surgeon has performed an operation on 1 or more of your nerves. Many times, patients feel better immediately after surgery and can "overdo it." Even if you feel well, it is important that you follow these activity guidelines. If you do not let your nerves heal properly there is a chance for recurrence and return of your symptoms.  We do not expect your symptoms to get better immediately. The following are instructions to help in your recovery once you have been discharged from the hospital.  * It is ok to take NSAIDs after surgery.  Activity   Avoid lifting objects heavier than 10 pounds (gallon milk jug).  Where possible, avoid household activities that involve lifting, bending, pushing, or pulling such as laundry, vacuuming, grocery shopping, and childcare. Try to arrange for help from friends and family for  these activities while your back heals.  Increase physical activity slowly as tolerated.  Taking short walks is encouraged, but avoid strenuous exercise. Do not jog, run, bicycle, lift weights, or participate in any other exercises unless specifically allowed by your doctor. Avoid prolonged sitting, including car rides.  You should not drive until off of pain medication.    For ultrasound guided carpal tunnel release you may return to regular activity at 3 days.  You may shower three days after your surgery.  After showering, lightly dab your incision dry. Do not take a tub bath or go swimming for 3 weeks, or until approved by your doctor at your follow-up appointment.  If you smoke, we strongly recommend that you quit.  Smoking has been proven to interfere with normal healing in your back and will dramatically reduce the success rate of your surgery. Please contact [facility] (800-QUIT-NOW) and use the resources at www.[facility].com for assistance in stopping smoking.   Surgical Incision   If you have a dressing on your incision, you may remove it three days after your surgery. Keep your incision area clean and dry.  If you have staples or stitches on your incision, you should have a follow up scheduled for removal. If you do not have staples or stitches, you will have steri-strips (small pieces of surgical tape) or Dermabond glue. The steri-strips/glue should begin to peel away within about a week (it is fine if the steri-strips fall off before then). If the strips are still in place one week after your surgery, you may gently remove them.  Diet  You may return to your usual diet. Be sure to stay hydrated.  When to Contact us  Although your surgery and recovery will likely be uneventful, you may have some residual numbness, aches, and pains in your back and/or legs. This is normal and should improve in the next few weeks.  However, should you experience any of the following,  contact us immediately: New numbness or weakness Pain that is progressively getting worse, and is not relieved by your pain medications or rest Bleeding, redness, swelling, pain, or drainage from surgical incision Chills or flu-like symptoms Fever greater than 101.0 F (38.3 C) Problems with bowel or bladder functions Difficulty breathing or shortness of breath Warmth, tenderness, or swelling in your calf  Contact Information How to contact us:  If you have any questions/concerns before or after surgery, you can reach [place] at [phone], or you can send a mychart message. We can be reached by phone or mychart 8am-4pm, Monday-Friday.  *Please note: Calls after 4pm are forwarded to a third party answering service. Mychart messages are not routinely monitored during evenings, weekends, and holidays. Please call our office to contact the answering service for urgent concerns during non-business hours.   Increase activity slowly   Complete by: As directed       Allergies as of 11/30/2022       Reactions   Codeine Diarrhea, Nausea And Vomiting        Medication List     TAKE these medications    alendronate 70 MG tablet Commonly known as: FOSAMAX TAKE 1 TABLET EVERY 7 DAYS WITH A FULL GLASS OF WATER ON AN EMPTY STOMACH DO NOT LIE DOWN FOR AT LEAST 30 MIN   Aleve PM 220-25 MG Tabs Generic drug: Naproxen Sod-diphenhydrAMINE Take 2 capsules by mouth at bedtime.   Calcium Carbonate-Vitamin D 600-400 MG-UNIT tablet Take 1 tablet by mouth daily.   CoQ-10 200 MG Caps Take 200 mg by mouth at bedtime.   famotidine 40 MG tablet Commonly known as: PEPCID TAKE ONE TABLET BY MOUTH AT BEDTIME   fluticasone 50 MCG/ACT nasal spray Commonly known as: FLONASE Place 2 sprays into both nostrils daily.   gabapentin 100 MG capsule Commonly known as: NEURONTIN TAKE 2 CAPSULES BY MOUTH TWICE DAILY.   HYDROcodone-acetaminophen 5-325 MG tablet Commonly known as: NORCO/VICODIN Take 1 tablet by  mouth every 4 (four) hours as needed for up to 5 days for moderate pain (pain score 4-6).   multivitamin with minerals Tabs tablet Take 1 tablet by mouth daily.   naproxen sodium 220 MG tablet Commonly known as: ALEVE Take 220 mg by mouth daily as needed (pain).   Rexulti 0.5 MG Tabs Generic drug: Brexpiprazole Take 0.5 mg by mouth daily.   rosuvastatin 20 MG tablet Commonly known as: Crestor Take 1 tablet (20 mg total) by mouth daily.   sertraline 100 MG tablet Commonly known as: ZOLOFT TAKE ONE TABLET BY MOUTH EVERY DAY        Follow-up Information     Drake Leach, PA-C. Go on 12/13/2022.   Specialty: Neurosurgery Why: at 3 pm for post operative follow up Contact information: [address] [place] [address] [phone]                 Signed: Susanne Borders 11/30/2022, 10:11 AM

## 2022-11-30 NOTE — Interval H&P Note (Signed)
History and Physical Interval Note:  11/30/2022 8:31 AM  Shari Rivera  has presented today for surgery, with the diagnosis of G56.02 Left carpal tunnel syndrome.  The various methods of treatment have been discussed with the patient and family. After consideration of risks, benefits and other options for treatment, the patient has consented to  Procedure(s) with comments: LEFT CARPAL TUNNEL RELEASE WITH ULTRASOUND GUIDANCE (Left) - LOCAL W/ MAC as a surgical intervention.  The patient's history has been reviewed, patient examined, no change in status, stable for surgery.  I have reviewed the patient's chart and labs.  Questions were answered to the patient's satisfaction.    Heart and lungs clear  Lovenia Kim

## 2022-11-30 NOTE — Telephone Encounter (Signed)
REVIEWED. I am not familiar with the portal that is being referred to in the phone message.

## 2022-12-03 NOTE — Anesthesia Postprocedure Evaluation (Signed)
Anesthesia Post Note  Patient: Shari Rivera  Procedure(s) Performed: LEFT CARPAL TUNNEL RELEASE WITH ULTRASOUND GUIDANCE (Left: Wrist)  Patient location during evaluation: PACU Anesthesia Type: General Level of consciousness: awake and alert Pain management: pain level controlled Vital Signs Assessment: post-procedure vital signs reviewed and stable Respiratory status: spontaneous breathing, nonlabored ventilation, respiratory function stable and patient connected to nasal cannula oxygen Cardiovascular status: blood pressure returned to baseline and stable Postop Assessment: no apparent nausea or vomiting Anesthetic complications: no   No notable events documented.   Last Vitals:  Vitals:   11/30/22 0945 11/30/22 1000  BP: 125/78 128/70  Pulse: 78 69  Resp: 16 16  Temp: 36.6 C 36.4 C  SpO2: 100% 97%    Last Pain:  Vitals:   12/01/22 0832  TempSrc:   PainSc: 1                  Lenard Simmer

## 2022-12-06 NOTE — Telephone Encounter (Signed)
Okay, I can call her tomorrow to check in as well

## 2022-12-06 NOTE — Telephone Encounter (Signed)
Called patient to schedule her a sooner date but she was unable to come in. We are just leaving appointment where it is.

## 2022-12-06 NOTE — Telephone Encounter (Signed)
Can bring her to see me

## 2022-12-07 ENCOUNTER — Ambulatory Visit: Payer: Medicare Other | Admitting: Neurosurgery

## 2022-12-07 DIAGNOSIS — G5602 Carpal tunnel syndrome, left upper limb: Secondary | ICD-10-CM

## 2022-12-07 NOTE — Progress Notes (Signed)
I had a follow-up phone call with Shari Rivera today.  She underwent a ultrasound-guided carpal tunnel decompression.  She states that she was doing well for approximately 2 days and then unfortunately developed bruising and a hematoma.  This is caused irritation of her nerves in her hands.  She has not noticed any worsening weakness, although she does get intermittent electric-like sensations down to her hands.  This especially noted when she is moving her fingers or hands.  She gets some pain in the wrist, she does improve with elevation of her hand especially draining out some of the swelling.  Given her symptoms we feel that she is likely had some postoperative hematoma with some irritation of the nerve, since her strength and sensation continues to be stable we will plan for steroid taper and close follow-up.  We did mention that some ongoing bleeding is a rare complication of this procedure, she acknowledged this finding.  Will follow-up with her after her steroid taper.  Has been sent into her pharmacy.  We spent 12 minutes on the phone.

## 2022-12-08 ENCOUNTER — Telehealth: Payer: Self-pay | Admitting: Pharmacist

## 2022-12-08 ENCOUNTER — Other Ambulatory Visit: Payer: Medicare Other | Admitting: Pharmacist

## 2022-12-08 MED ORDER — METHYLPREDNISOLONE 4 MG PO TBPK: ORAL_TABLET | ORAL | 0 refills | Status: DC

## 2022-12-08 NOTE — Progress Notes (Signed)
   Outreach Note  12/08/2022 Name: Shari Rivera MRN: [id number] DOB: 1955/05/22  Referred by: Ronnald Ramp, MD  Was unable to reach patient via telephone today and have left HIPAA compliant voicemail asking patient to return my call. Outreach attempt #2.  Follow Up Plan: Will collaborate with Care Guide to outreach to schedule follow up with me  Estelle Grumbles, PharmD, [facility] [phone]

## 2022-12-10 NOTE — Progress Notes (Unsigned)
   REFERRING PHYSICIAN:  Ronnald Ramp, Md [address] [place],  [place] [address]  DOS: 11/30/22 left sided carpal tunnel decompression with ultrasound guidance   HISTORY OF PRESENT ILLNESS: Shari Rivera is 2 weeks status post above surgery. Given norco 5 on discharge from the hospital.   She called on 12/06/22 with increased numbness/tingling and electric pain in her hand. Dr. Katrinka Blazing called in a medrol dose pack and told her this should improve.   She was found to have postoperative hematoma which causing some nerve irritation even in the ulnar distribution.,  Given these findings we did given her steroid Dosepak, after the initial dose she felt improvement.     PHYSICAL EXAMINATION:  NEUROLOGICAL:  General: In no acute distress.   Awake, alert, oriented to person, place, and time.  Pupils equal round and reactive to light.  Facial tone is symmetric.    Strength: Continues to have at least 4+ out of 5 in median intrinsic hand muscles.  Incision c/d/i  Imaging:  Nothing new to review.   Assessment / Plan: Shari Rivera is doing well s/p above surgery. Treatment options reviewed with patient and following plan made:   -Her incision is healing well -She can return to work as tolerated -She was urged to continue her steroid Dosepak as it is giving her an improvement in her symptoms  Advised to contact the office if any questions or concerns arise.   Ernestine Mcmurray, MD Dept of Neurosurgery

## 2022-12-13 ENCOUNTER — Telehealth: Payer: Self-pay

## 2022-12-13 ENCOUNTER — Ambulatory Visit (INDEPENDENT_AMBULATORY_CARE_PROVIDER_SITE_OTHER): Payer: Medicare Other | Admitting: Neurosurgery

## 2022-12-13 VITALS — BP 110/76 | Temp 98.5°F

## 2022-12-13 DIAGNOSIS — Z9889 Other specified postprocedural states: Secondary | ICD-10-CM

## 2022-12-13 DIAGNOSIS — G5602 Carpal tunnel syndrome, left upper limb: Secondary | ICD-10-CM

## 2022-12-13 NOTE — Progress Notes (Signed)
  Care Coordination Note  12/13/2022 Name: [name] MRN: [id number] DOB: 1955/11/06  Shari Rivera is a 67 y.o. year old female who is a primary care patient of Simmons-Robinson, Tawanna Cooler, MD and is actively engaged with the Chronic Care Management team. I reached out to Augusto Gamble by phone today to assist with re-scheduling a follow up visit with the Pharmacist  Follow up plan: Telephone appointment with care management team member scheduled for:12/31/2022  Penne Lash , RMA     [facility]  [facility], [facility] Guide  Direct Dial: [phone]  Website: [email or web address]

## 2022-12-31 ENCOUNTER — Other Ambulatory Visit: Payer: Self-pay | Admitting: Pharmacist

## 2022-12-31 NOTE — Patient Instructions (Signed)
Goals Addressed             This Visit's Progress    Pharmacy Goals       Please continue to use your weekly pillbox to organize your medications.   You can find more information about your [employer] [place] Medicaid benefits by visiting:    [email or web address]   Estelle Grumbles, PharmD, [facility] [phone]

## 2022-12-31 NOTE — Progress Notes (Signed)
   12/31/2022 Name: Shari Rivera MRN: [id number] DOB: 11/27/1955  Chief Complaint  Patient presents with   Medication Access    Shari Rivera is a 67 y.o. year old female who presented for a telephone visit.   They were referred to the pharmacist by their PCP for assistance in managing medication access.      Subjective:   Care Team: Primary Care Provider: Ronnald Ramp, MD ; Next Scheduled Visit: 02/07/2023  Medication Access/Adherence  Current Pharmacy:  [facility] - [place], [place] - [address] [place] [address] Phone: [phone] Fax: [phone]   Uses weekly pillbox to organize her medications  Reports recently switched pharmacies to [facility] as [facility] was no longer in network with her insurance (update preferred pharmacy list in chart) - Reports has already had all of her prescriptions transferred to [facility]   Depression:   Current medication:  - sertraline 100 mg daily - Rexulti 0.5 mg daily   Previous therapies tried: aripiprazole   Reports she is doing well on Rexulti; feeling better than she has in years   Objective:   Lab Results  Component Value Date   CREATININE 1.01 (H) 11/19/2022   BUN 14 11/19/2022   NA 139 11/19/2022   K 3.6 11/19/2022   CL 106 11/19/2022   CO2 27 11/19/2022    Current Outpatient Medications on File Prior to Visit  Medication Sig Dispense Refill   alendronate (FOSAMAX) 70 MG tablet TAKE 1 TABLET EVERY 7 DAYS WITH A FULL GLASS OF WATER ON AN EMPTY STOMACH DO NOT LIE DOWN FOR AT LEAST 30 MIN 12 tablet 3   Brexpiprazole (REXULTI) 0.5 MG TABS Take 0.5 mg by mouth daily.     Calcium Carbonate-Vitamin D 600-400 MG-UNIT tablet Take 1 tablet by mouth daily.     Coenzyme Q10 (COQ-10) 200 MG CAPS Take 200 mg by mouth at bedtime.     famotidine (PEPCID) 40 MG tablet TAKE ONE TABLET BY MOUTH AT BEDTIME 30 tablet 6   fluticasone (FLONASE) 50 MCG/ACT nasal spray Place 2  sprays into both nostrils daily. 16 g 6   gabapentin (NEURONTIN) 100 MG capsule TAKE 2 CAPSULES BY MOUTH TWICE DAILY. 180 capsule 1   methylPREDNISolone (MEDROL DOSEPAK) 4 MG TBPK tablet Take as Directed, per package instructions 1 each 0   Multiple Vitamin (MULTIVITAMIN WITH MINERALS) TABS tablet Take 1 tablet by mouth daily.     Naproxen Sod-diphenhydrAMINE (ALEVE PM) 220-25 MG TABS Take 2 capsules by mouth at bedtime. (Patient not taking: Reported on 12/13/2022)     naproxen sodium (ALEVE) 220 MG tablet Take 220 mg by mouth daily as needed (pain). (Patient not taking: Reported on 12/13/2022)     rosuvastatin (CRESTOR) 20 MG tablet Take 1 tablet (20 mg total) by mouth daily. 90 tablet 1   sertraline (ZOLOFT) 100 MG tablet TAKE ONE TABLET BY MOUTH EVERY DAY 90 tablet 1   No current facility-administered medications on file prior to visit.     Assessment/Plan:   Depression: - Currently controlled   Follow Up Plan:   Patient denies further medication questions or concerns today Provide patient with contact information for clinic pharmacist to contact if needed in future for medication questions/concerns   Estelle Grumbles, PharmD, [facility] [phone]

## 2023-01-10 ENCOUNTER — Encounter: Payer: Medicare Other | Admitting: Neurosurgery

## 2023-02-07 ENCOUNTER — Ambulatory Visit (INDEPENDENT_AMBULATORY_CARE_PROVIDER_SITE_OTHER): Payer: Medicare Other | Admitting: Family Medicine

## 2023-02-07 ENCOUNTER — Encounter: Payer: Self-pay | Admitting: Family Medicine

## 2023-02-07 VITALS — BP 126/70 | Ht 63.0 in | Wt 117.0 lb

## 2023-02-07 DIAGNOSIS — M810 Age-related osteoporosis without current pathological fracture: Secondary | ICD-10-CM | POA: Diagnosis not present

## 2023-02-07 DIAGNOSIS — F3341 Major depressive disorder, recurrent, in partial remission: Secondary | ICD-10-CM | POA: Diagnosis not present

## 2023-02-07 DIAGNOSIS — E782 Mixed hyperlipidemia: Secondary | ICD-10-CM | POA: Diagnosis not present

## 2023-02-07 DIAGNOSIS — H9193 Unspecified hearing loss, bilateral: Secondary | ICD-10-CM | POA: Diagnosis not present

## 2023-02-07 DIAGNOSIS — H26492 Other secondary cataract, left eye: Secondary | ICD-10-CM

## 2023-02-07 MED ORDER — REXULTI 0.5 MG PO TABS: 1.0000 mg | ORAL_TABLET | Freq: Every day | ORAL | 3 refills | Status: DC

## 2023-02-07 NOTE — Assessment & Plan Note (Signed)
 Chronic condition managed with Fosamax and calcium supplementation. Discussed importance of adherence to medication regimen and potential outcomes of continued treatment. - Continue Fosamax 70 mg once weekly - Continue calcium supplementation

## 2023-02-07 NOTE — Assessment & Plan Note (Signed)
 Significant hearing loss in both ears, right ear worse than left. Difficulty affording hearing aids. Discussed options for obtaining hearing aids through state programs and [employer], including potential benefits and costs. - Refer to value-based care team for assistance with hearing aid affordability - Encourage contact with [employer] for hearing aid pricing and use sister's membership for potential discount - Follow up with audiologist for hearing aid program through insurance

## 2023-02-07 NOTE — Assessment & Plan Note (Signed)
 Chronic condition requiring monitoring. Last lipid panel in March 2023. Discussed need for repeat lipid panel and potential outcomes of lipid management. - Order repeat lipid panel - Continue crestor 20mg  daily

## 2023-02-07 NOTE — Progress Notes (Signed)
 Established patient visit   Patient: Shari Rivera   DOB: 1955/04/01   68 y.o. Female  MRN: [id number] Visit Date: 02/07/2023  Today's healthcare provider: Rockie Agent, MD   Chief Complaint  Patient presents with   Follow-up   Subjective       Discussed the use of AI scribe software for clinical note transcription with the patient, who gave verbal consent to proceed.  History of Present Illness   The patient is a 68 year old individual with a primary diagnosis of recurrent major depressive disorder, who also suffers from age-related hearing loss, osteoporosis, and mixed hyperlipidemia. The patient reports no significant improvement in depressive symptoms despite being on Rexulti . The patient also reports significant vision problems, describing it as seeing through lace or a spider web, which is particularly distressing when driving. The patient has attempted to schedule an appointment with the ophthalmologist who performed her surgery, but has not received a response.  The patient also reports significant hearing loss, with the right ear being worse than the left. The patient has been recommended hearing aids but reports financial constraints as a barrier to obtaining them. The patient is considering purchasing a hearing aid for the worse ear through [employer], using a family member's membership.  The patient also reports issues with her dentures, which are described as cosmetic only and not functional. The patient reports that they fall out when chewing, and even with the use of adhesive powder, they do not stay in place. This has led to the patient eating most meals alone and avoiding eating out, which contributes to her depressive symptoms.  The patient also reports chronic neck pain, which is not relieved by gabapentin  or Aleve. The patient has seen a neurosurgeon for carpal tunnel syndrome in the left hand and is scheduled for surgery on the right hand, followed by  treatment for the neck pain. The patient expresses a desire to have the neck pain addressed before the right carpal tunnel surgery due to the severity of the discomfort.  The patient also reports a history of dysphagia, which has improved, and indigestion, which she attributes to her diet of mostly coffee due to difficulties with eating. The patient has been recommended to see a gastroenterologist for the reflux and to schedule a colonoscopy. The patient has not yet done so.         Past Medical History:  Diagnosis Date   Allergy    Anemia    Anxiety    Arthritis    Cancer (HCC)    Carpal tunnel syndrome, bilateral    Cataract    COVID-19    Depression    Dyspnea    former smoker   Endometriosis    Family history of adverse reaction to anesthesia    Mother difficult to wake up   GERD (gastroesophageal reflux disease)    Pneumonia    Pre-diabetes     Medications: Outpatient Medications Prior to Visit  Medication Sig   alendronate  (FOSAMAX ) 70 MG tablet TAKE 1 TABLET EVERY 7 DAYS WITH A FULL GLASS OF WATER ON AN EMPTY STOMACH DO NOT LIE DOWN FOR AT LEAST 30 MIN   Calcium  Carbonate-Vitamin D  600-400 MG-UNIT tablet Take 1 tablet by mouth daily.   Coenzyme Q10 (COQ-10) 200 MG CAPS Take 200 mg by mouth at bedtime.   famotidine  (PEPCID ) 40 MG tablet TAKE ONE TABLET BY MOUTH AT BEDTIME   fluticasone  (FLONASE ) 50 MCG/ACT nasal spray Place 2 sprays into both nostrils  daily.   gabapentin  (NEURONTIN ) 100 MG capsule TAKE 2 CAPSULES BY MOUTH TWICE DAILY.   Multiple Vitamin (MULTIVITAMIN WITH MINERALS) TABS tablet Take 1 tablet by mouth daily.   Naproxen Sod-diphenhydrAMINE (ALEVE PM) 220-25 MG TABS Take 2 capsules by mouth at bedtime.   naproxen sodium (ALEVE) 220 MG tablet Take 220 mg by mouth daily as needed (pain).   rosuvastatin  (CRESTOR ) 20 MG tablet Take 1 tablet (20 mg total) by mouth daily.   sertraline  (ZOLOFT ) 100 MG tablet TAKE ONE TABLET BY MOUTH EVERY DAY   [DISCONTINUED]  Brexpiprazole  (REXULTI ) 0.5 MG TABS Take 1 mg by mouth daily.   [DISCONTINUED] methylPREDNISolone  (MEDROL  DOSEPAK) 4 MG TBPK tablet Take as Directed, per package instructions   No facility-administered medications prior to visit.      02/07/2023    3:46 PM 10/07/2022    3:37 PM 11/11/2020    4:52 PM  GAD 7 : Generalized Anxiety Score  Nervous, Anxious, on Edge 3 2 3   Control/stop worrying 1 3 3   Worry too much - different things 1 3 3   Trouble relaxing 3 2 3   Restless 3 2 3   Easily annoyed or irritable 1 2 3   Afraid - awful might happen 0 3 0  Total GAD 7 Score 12 17 18   Anxiety Difficulty Not difficult at all Somewhat difficult Very difficult    Flowsheet Row Office Visit from 02/07/2023 in [facility]  PHQ-9 Total Score 17       Review of Systems  Last CBC Lab Results  Component Value Date   WBC 6.6 11/19/2022   HGB 12.0 11/19/2022   HCT 37.2 11/19/2022   MCV 95.4 11/19/2022   MCH 30.8 11/19/2022   RDW 13.1 11/19/2022   PLT 217 11/19/2022   Last metabolic panel Lab Results  Component Value Date   GLUCOSE 122 (H) 11/19/2022   NA 139 11/19/2022   K 3.6 11/19/2022   CL 106 11/19/2022   CO2 27 11/19/2022   BUN 14 11/19/2022   CREATININE 1.01 (H) 11/19/2022   GFRNONAA >60 11/19/2022   CALCIUM  9.5 11/19/2022   PROT 6.2 06/10/2022   ALBUMIN 3.9 06/10/2022   LABGLOB 2.3 06/10/2022   AGRATIO 1.7 06/10/2022   BILITOT <0.2 06/10/2022   ALKPHOS 96 06/10/2022   AST 26 06/10/2022   ALT 27 06/10/2022   ANIONGAP 6 11/19/2022   Last lipids Lab Results  Component Value Date   CHOL 144 03/26/2021   HDL 49 03/26/2021   LDLCALC 79 03/26/2021   TRIG 80 03/26/2021   Last hemoglobin A1c Lab Results  Component Value Date   HGBA1C 5.7 (H) 06/10/2022   Last thyroid  functions Lab Results  Component Value Date   TSH 3.270 06/10/2022   Last vitamin D  No results found for: 25OHVITD2, 25OHVITD3, VD25OH      Objective    BP 126/70  (BP Location: Right Arm, Patient Position: Sitting, Cuff Size: Normal)   Ht 5' 3 (1.6 m)   Wt 117 lb (53.1 kg)   BMI 20.73 kg/m  BP Readings from Last 3 Encounters:  02/07/23 126/70  12/13/22 110/76  11/30/22 128/70   Wt Readings from Last 3 Encounters:  02/07/23 117 lb (53.1 kg)  11/19/22 126 lb 8.7 oz (57.4 kg)  11/10/22 123 lb (55.8 kg)        Physical Exam  General: Alert, no acute distress Cardio: Normal S1 and S2, RRR, no r/m/g Pulm: CTAB, normal work of breathing Abdomen: Bowel  sounds normal. Abdomen soft and non-tender.    No results found for any visits on 02/07/23.  Assessment & Plan     Problem List Items Addressed This Visit       Nervous and Auditory   Hearing loss associated with syndrome of both ears   Significant hearing loss in both ears, right ear worse than left. Difficulty affording hearing aids. Discussed options for obtaining hearing aids through state programs and [employer], including potential benefits and costs. - Refer to value-based care team for assistance with hearing aid affordability - Encourage contact with [employer] for hearing aid pricing and use sister's membership for potential discount - Follow up with audiologist for hearing aid program through insurance      Relevant Orders   AMB Referral VBCI Care Management     Musculoskeletal and Integument   Age-related osteoporosis without current pathological fracture   Chronic condition managed with Fosamax  and calcium  supplementation. Discussed importance of adherence to medication regimen and potential outcomes of continued treatment. - Continue Fosamax  70 mg once weekly - Continue calcium  supplementation        Other   Recurrent major depressive disorder, in partial remission (HCC) - Primary   Chronic condition, not well controlled. No significant improvement with Rexulti ; experiencing vision problems, hearing loss, and dental issues contributing to depression. Expresses stress and  being overwhelmed due to multiple health issues. Discussed increasing Rexulti  dosage, potential benefits, risks of side effects, and need for follow-up. - Increase Rexulti  to 1 mg daily - Refer to social workers for assistance with depression and related issues - Refer to nurse case management for assistance with depression and related issues      Relevant Orders   AMB Referral VBCI Care Management   Hyperlipidemia   Chronic condition requiring monitoring. Last lipid panel in March 2023. Discussed need for repeat lipid panel and potential outcomes of lipid management. 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 Difficulty driving and experiencing pain. Discussed need for urgent ophthalmology referral and potential outcomes. - Urgent referral to ophthalmologist for evaluation and management  Chronic Neck Pain Persistent neck pain not relieved by current medications. Awaiting neurosurgical intervention. Discussed plan to address neck pain after carpal tunnel surgeries and potential outcomes. - Continue gabapentin  200 mg twice daily - Follow up with neurosurgeon for neck pain management  Carpal Tunnel Syndrome Post-surgical pain in left wrist, awaiting surgery on right wrist. Discussed plan for sequential surgeries and potential outcomes. - Follow up with neurosurgeon for right carpal tunnel surgery and subsequent neck pain management  Gastroesophageal Reflux Disease (GERD) Chronic condition managed with Pepcid . Reports significant indigestion, likely exacerbated by dietary habits. Discussed need for gastroenterology follow-up and potential outcomes of further evaluation. - Continue  Pepcid  40 mg daily - Encourage contact with gastroenterologist for colonoscopy and further evaluation of reflux symptoms   General Health Maintenance Routine health maintenance and screenings. Discussed importance of regular monitoring and follow-up. - Order A1c test - Schedule follow-up appointment in April    Return in about 3 months (around 05/08/2023) for CHRONIC F/U.  Rockie Agent, MD  [facility] [phone] (phone) [phone] (fax)  [facility]

## 2023-02-07 NOTE — Assessment & Plan Note (Signed)
 Chronic condition, not well controlled. No significant improvement with Rexulti ; experiencing vision problems, hearing loss, and dental issues contributing to depression. Expresses stress and being overwhelmed due to multiple health issues. Discussed increasing Rexulti  dosage, potential benefits, risks of side effects, and need for follow-up. - Increase Rexulti  to 1 mg daily - Refer to social workers for assistance with depression and related issues - Refer to nurse case management for assistance with depression and related issues

## 2023-02-08 ENCOUNTER — Telehealth: Payer: Self-pay | Admitting: *Deleted

## 2023-02-08 LAB — LIPID PANEL
Chol/HDL Ratio: 3 {ratio} (ref 0.0–4.4)
Cholesterol, Total: 133 mg/dL (ref 100–199)
HDL: 44 mg/dL (ref >39)
LDL Chol Calc (NIH): 67 mg/dL (ref 0–99)
Triglycerides: 121 mg/dL (ref 0–149)
VLDL Cholesterol Cal: 22 mg/dL (ref 5–40)

## 2023-02-08 NOTE — Progress Notes (Signed)
 Complex Care Management Note Care Guide Note  02/08/2023 Name: Shari Rivera MRN: [id number] DOB: 03-07-1955   Complex Care Management Outreach Attempts: An unsuccessful telephone outreach was attempted today to offer the patient information about available complex care management services.  Follow Up Plan:  Additional outreach attempts will be made to offer the patient complex care management information and services.   Encounter Outcome:  No Answer  Thedford Franks, CCMA Care Coordination Care Guide Direct Dial: [phone]

## 2023-02-09 NOTE — Progress Notes (Signed)
Complex Care Management Note Care Guide Note  02/09/2023 Name: Shari Rivera MRN: [id number] DOB: October 12, 1955   Complex Care Management Outreach Attempts: A second unsuccessful outreach was attempted today to offer the patient with information about available complex care management services.  Follow Up Plan:  Additional outreach attempts will be made to offer the patient complex care management information and services.   Encounter Outcome:  No Answer  Burman Nieves, CMA, Care Guide [facility]  [facility], [facility] Guide Direct Dial: [phone]  Fax: [phone] Website: [email or web address]

## 2023-02-10 ENCOUNTER — Encounter: Payer: Self-pay | Admitting: *Deleted

## 2023-02-10 NOTE — Progress Notes (Signed)
Complex Care Management Note Care Guide Note  02/10/2023 Name: Shari Rivera MRN: [id number] DOB: 1955-03-18   Complex Care Management Outreach Attempts: A third unsuccessful outreach was attempted today to offer the patient with information about available complex care management services.  Follow Up Plan:  No further outreach attempts will be made at this time. We have been unable to contact the patient to offer or enroll patient in complex care management services.  Encounter Outcome:  No Answer  Burman Nieves, CMA, Care Guide [facility]  [facility], [facility] Guide Direct Dial: [phone]  Fax: [phone] Website: [email or web address]

## 2023-02-17 ENCOUNTER — Other Ambulatory Visit: Payer: Self-pay | Admitting: Family Medicine

## 2023-03-22 ENCOUNTER — Other Ambulatory Visit: Payer: Self-pay | Admitting: Family Medicine

## 2023-03-22 DIAGNOSIS — E785 Hyperlipidemia, unspecified: Secondary | ICD-10-CM

## 2023-03-22 NOTE — Telephone Encounter (Signed)
 Copied from CRM [id number]. Topic: Clinical - Medication Refill >> Mar 22, 2023  4:48 PM Shon Hale wrote: Most Recent Primary Care Visit:  Provider: Ronnald Ramp  Department: [id number]  Visit Type: OFFICE VISIT  Date: 02/07/2023  Medication: rosuvastatin (CRESTOR) 20 MG tablet Patient advised by pharmacy multiple requests have been sent to office & have not heard back from office.   Patient has been out of the rx for about a month now.  Has the patient contacted their pharmacy? Yes Reach out to office.  Is this the correct pharmacy for this prescription? Yes If no, delete pharmacy and type the correct one.  This is the patient's preferred pharmacy:  [facility] - [place], [place] - [address] [place] [address] Phone: [phone] Fax: [phone]   Has the prescription been filled recently? No  Is the patient out of the medication? Yes  Has the patient been seen for an appointment in the last year OR does the patient have an upcoming appointment? Yes  Can we respond through MyChart? No, prefers a phone call.  Agent: Please be advised that Rx refills may take up to 3 business days. We ask that you follow-up with your pharmacy.

## 2023-03-24 MED ORDER — ROSUVASTATIN CALCIUM 20 MG PO TABS: 20.0000 mg | ORAL_TABLET | Freq: Every day | ORAL | 1 refills | Status: DC

## 2023-05-02 ENCOUNTER — Encounter: Payer: Self-pay | Admitting: *Deleted

## 2023-05-02 ENCOUNTER — Telehealth: Payer: Self-pay

## 2023-05-02 ENCOUNTER — Other Ambulatory Visit: Payer: Self-pay | Admitting: *Deleted

## 2023-05-02 ENCOUNTER — Telehealth: Payer: Self-pay | Admitting: *Deleted

## 2023-05-02 DIAGNOSIS — Z1211 Encounter for screening for malignant neoplasm of colon: Secondary | ICD-10-CM

## 2023-05-02 MED ORDER — NA SULFATE-K SULFATE-MG SULF 17.5-3.13-1.6 GM/177ML PO SOLN: 1.0000 | Freq: Once | ORAL | 0 refills | Status: AC

## 2023-05-02 NOTE — Telephone Encounter (Signed)
 Noted. Patient had not completed a colonoscopy.   Colonoscopy cancel on 02/17/2021 with Dr Tobi Bastos  Colonoscopy cancel on 04/21/2022 with Dr Allegra Lai

## 2023-05-02 NOTE — Telephone Encounter (Signed)
 The patient called in to schedule colonoscopy.

## 2023-05-02 NOTE — Telephone Encounter (Signed)
 Gastroenterology Pre-Procedure Review  Request Date: 06/07/2023 Requesting Physician: Dr. Servando Snare  PATIENT REVIEW QUESTIONS: The patient responded to the following health history questions as indicated:    1. Are you having any GI issues? no 2. Do you have a personal history of Polyps? no 3. Do you have a family history of Colon Cancer or Polyps? no 4. Diabetes Mellitus? no 5. Joint replacements in the past 12 months?no 6. Major health problems in the past 3 months?no 7. Any artificial heart valves, MVP, or defibrillator?no    MEDICATIONS & ALLERGIES:    Patient reports the following regarding taking any anticoagulation/antiplatelet therapy:   Plavix, Coumadin, Eliquis, Xarelto, Lovenox, Pradaxa, Brilinta, or Effient? no Aspirin? no  Patient confirms/reports the following medications:  Current Outpatient Medications  Medication Sig Dispense Refill   alendronate (FOSAMAX) 70 MG tablet TAKE 1 TABLET EVERY 7 DAYS WITH A FULL GLASS OF WATER ON AN EMPTY STOMACH DO NOT LIE DOWN FOR AT LEAST 30 MIN 12 tablet 3   Brexpiprazole (REXULTI) 0.5 MG TABS Take 2 tablets (1 mg total) by mouth daily. 60 tablet 3   Calcium Carbonate-Vitamin D 600-400 MG-UNIT tablet Take 1 tablet by mouth daily.     Coenzyme Q10 (COQ-10) 200 MG CAPS Take 200 mg by mouth at bedtime.     famotidine (PEPCID) 40 MG tablet TAKE ONE TABLET BY MOUTH AT BEDTIME 30 tablet 6   fluticasone (FLONASE) 50 MCG/ACT nasal spray Place 2 sprays into both nostrils daily. 16 g 6   gabapentin (NEURONTIN) 100 MG capsule TAKE 2 CAPSULES BY MOUTH TWICE DAILY. 180 capsule 1   Multiple Vitamin (MULTIVITAMIN WITH MINERALS) TABS tablet Take 1 tablet by mouth daily.     Naproxen Sod-diphenhydrAMINE (ALEVE PM) 220-25 MG TABS Take 2 capsules by mouth at bedtime.     naproxen sodium (ALEVE) 220 MG tablet Take 220 mg by mouth daily as needed (pain).     rosuvastatin (CRESTOR) 20 MG tablet Take 1 tablet (20 mg total) by mouth daily. 90 tablet 1    sertraline (ZOLOFT) 100 MG tablet TAKE ONE TABLET BY MOUTH EVERY DAY 90 tablet 1   No current facility-administered medications for this visit.    Patient confirms/reports the following allergies:  Allergies  Allergen Reactions   Codeine Diarrhea and Nausea And Vomiting    No orders of the defined types were placed in this encounter.   AUTHORIZATION INFORMATION Primary Insurance: 1D#: Group #:  Secondary Insurance: 1D#: Group #:  SCHEDULE INFORMATION: Date:  Time: Location:

## 2023-05-02 NOTE — Telephone Encounter (Signed)
 Colonoscopy schedule on 06/07/2023

## 2023-05-09 ENCOUNTER — Encounter: Payer: Self-pay | Admitting: Family Medicine

## 2023-05-09 ENCOUNTER — Ambulatory Visit (INDEPENDENT_AMBULATORY_CARE_PROVIDER_SITE_OTHER): Payer: Self-pay | Admitting: Family Medicine

## 2023-05-09 VITALS — BP 126/64 | HR 83 | Ht 63.0 in | Wt 123.0 lb

## 2023-05-09 DIAGNOSIS — F3341 Major depressive disorder, recurrent, in partial remission: Secondary | ICD-10-CM

## 2023-05-09 DIAGNOSIS — F331 Major depressive disorder, recurrent, moderate: Secondary | ICD-10-CM

## 2023-05-09 DIAGNOSIS — E782 Mixed hyperlipidemia: Secondary | ICD-10-CM | POA: Diagnosis not present

## 2023-05-09 DIAGNOSIS — E785 Hyperlipidemia, unspecified: Secondary | ICD-10-CM

## 2023-05-09 DIAGNOSIS — M542 Cervicalgia: Secondary | ICD-10-CM | POA: Diagnosis not present

## 2023-05-09 DIAGNOSIS — M79671 Pain in right foot: Secondary | ICD-10-CM

## 2023-05-09 DIAGNOSIS — M79672 Pain in left foot: Secondary | ICD-10-CM

## 2023-05-09 DIAGNOSIS — B351 Tinea unguium: Secondary | ICD-10-CM | POA: Insufficient documentation

## 2023-05-09 MED ORDER — COQ-10 200 MG PO CAPS: 200.0000 mg | ORAL_CAPSULE | Freq: Every day | ORAL | 3 refills | Status: AC

## 2023-05-09 MED ORDER — SERTRALINE HCL 100 MG PO TABS: 150.0000 mg | ORAL_TABLET | Freq: Every day | ORAL | 1 refills | Status: DC

## 2023-05-09 MED ORDER — CLONAZEPAM 0.5 MG PO TABS: 0.5000 mg | ORAL_TABLET | Freq: Every evening | ORAL | 1 refills | Status: DC | PRN

## 2023-05-09 MED ORDER — ROSUVASTATIN CALCIUM 20 MG PO TABS: 20.0000 mg | ORAL_TABLET | Freq: Every day | ORAL | 3 refills | Status: AC

## 2023-05-09 NOTE — Progress Notes (Signed)
 "     Established patient visit   Patient: Shari Rivera   DOB: 1955-04-03   68 y.o. Female  MRN: [id number] Visit Date: 05/09/2023  Today's healthcare provider: Rockie Agent, MD   Chief Complaint  Patient presents with   Follow Up     Discuss medication, depressed    Subjective     HPI     Follow Up     Additional comments: Discuss medication, depressed       Last edited by Thelbert Eulalio HERO, CMA on 05/09/2023  4:23 PM.       Discussed the use of AI scribe software for clinical note transcription with the patient, who gave verbal consent to proceed.  History of Present Illness [employer] is a 68 year old female who presents for a follow-up regarding complications following eye surgery and issues with medication management for depression.  She is experiencing complications following eye surgery, including worsening vision, particularly in the left eye, and increased sensitivity to light, causing pain in the eyeball. She is unable to drive even during the day due to her vision issues. There has been difficulty in obtaining a follow-up appointment with the [facility], and previous attempts to contact them have been unsuccessful.  She has been experiencing difficulties obtaining Rexulti  for her depression due to insurance coverage issues and pharmacy availability. She has stopped taking Rexulti  as she feels it is not helping significantly and is inconvenient to obtain. She feels worse, attributing it to external stressors, and experiences anxiety about financial stability and social security. She is currently taking Zoloft  100 mg for mood, which she feels is not helping much.  She reports chronic neck pain due to loss of cushioning between vertebrae, which is not relieved by gabapentin . She has stopped taking gabapentin  as it was ineffective and she has concerns about its safety. She is following up with neurosurgery for her neck issues.  She has  a history of anxiety and panic attacks, previously managed with medications like Ativan, Valium , and Xanax. She experiences panic attacks characterized by racing heart and difficulty breathing. She occasionally takes half a Xanax provided by her niece during severe episodes.  She has stopped taking her cholesterol medication as it was not refilled due to normal cholesterol levels. She was previously on Crestor  and CoQ10, which helped with leg cramps.  She reports issues with her feet, including pain and numbness in her toes, and fungal changes in her toenails. She is unable to manage her toenail care due to limited mobility and requires a referral to podiatry.     Past Medical History:  Diagnosis Date   Allergy    Anemia    Anxiety    Arthritis    Cancer (HCC)    Carpal tunnel syndrome, bilateral    Cataract    COVID-19    Depression    Dyspnea    former smoker   Endometriosis    Family history of adverse reaction to anesthesia    Mother difficult to wake up   GERD (gastroesophageal reflux disease)    Pneumonia    Pre-diabetes     Medications: Outpatient Medications Prior to Visit  Medication Sig Note   alendronate  (FOSAMAX ) 70 MG tablet TAKE 1 TABLET EVERY 7 DAYS WITH A FULL GLASS OF WATER ON AN EMPTY STOMACH DO NOT LIE DOWN FOR AT LEAST 30 MIN    Calcium  Carbonate-Vitamin D  600-400 MG-UNIT tablet Take 1 tablet by mouth daily.  famotidine  (PEPCID ) 40 MG tablet TAKE ONE TABLET BY MOUTH AT BEDTIME    fluticasone  (FLONASE ) 50 MCG/ACT nasal spray Place 2 sprays into both nostrils daily.    Multiple Vitamin (MULTIVITAMIN WITH MINERALS) TABS tablet Take 1 tablet by mouth daily.    Naproxen Sod-diphenhydrAMINE (ALEVE PM) 220-25 MG TABS Take 2 capsules by mouth at bedtime.    naproxen sodium (ALEVE) 220 MG tablet Take 220 mg by mouth daily as needed (pain).    [DISCONTINUED] sertraline  (ZOLOFT ) 100 MG tablet TAKE ONE TABLET BY MOUTH EVERY DAY    [DISCONTINUED] Brexpiprazole   (REXULTI ) 0.5 MG TABS Take 2 tablets (1 mg total) by mouth daily. (Patient not taking: Reported on 05/09/2023)    [DISCONTINUED] Coenzyme Q10 (COQ-10) 200 MG CAPS Take 200 mg by mouth at bedtime. (Patient not taking: Reported on 05/09/2023) 05/09/2023: pt reports not taking   [DISCONTINUED] gabapentin  (NEURONTIN ) 100 MG capsule TAKE 2 CAPSULES BY MOUTH TWICE DAILY. (Patient not taking: Reported on 05/09/2023) 05/09/2023: pt is concerned side effects   [DISCONTINUED] rosuvastatin  (CRESTOR ) 20 MG tablet Take 1 tablet (20 mg total) by mouth daily. (Patient not taking: Reported on 05/09/2023)    No facility-administered medications prior to visit.    Review of Systems  Last metabolic panel Lab Results  Component Value Date   GLUCOSE 122 (H) 11/19/2022   NA 139 11/19/2022   K 3.6 11/19/2022   CL 106 11/19/2022   CO2 27 11/19/2022   BUN 14 11/19/2022   CREATININE 1.01 (H) 11/19/2022   GFRNONAA >60 11/19/2022   CALCIUM  9.5 11/19/2022   PROT 6.2 06/10/2022   ALBUMIN 3.9 06/10/2022   LABGLOB 2.3 06/10/2022   AGRATIO 1.7 06/10/2022   BILITOT <0.2 06/10/2022   ALKPHOS 96 06/10/2022   AST 26 06/10/2022   ALT 27 06/10/2022   ANIONGAP 6 11/19/2022   Last lipids Lab Results  Component Value Date   CHOL 133 02/07/2023   HDL 44 02/07/2023   LDLCALC 67 02/07/2023   TRIG 121 02/07/2023   CHOLHDL 3.0 02/07/2023    Last hemoglobin A1c Lab Results  Component Value Date   HGBA1C 5.7 (H) 06/10/2022        Objective    BP 126/64   Pulse 83   Ht 5' 3 (1.6 m)   Wt 123 lb (55.8 kg)   SpO2 100%   BMI 21.79 kg/m  BP Readings from Last 3 Encounters:  05/09/23 126/64  02/07/23 126/70  12/13/22 110/76   Wt Readings from Last 3 Encounters:  05/09/23 123 lb (55.8 kg)  02/07/23 117 lb (53.1 kg)  11/19/22 126 lb 8.7 oz (57.4 kg)        Physical Exam Vitals reviewed.  Constitutional:      General: She is not in acute distress.    Appearance: Normal appearance. She is not  ill-appearing, toxic-appearing or diaphoretic.  Eyes:     Conjunctiva/sclera: Conjunctivae normal.  Cardiovascular:     Rate and Rhythm: Normal rate and regular rhythm.     Pulses: Normal pulses.     Heart sounds: Normal heart sounds. No murmur heard.    No friction rub. No gallop.  Pulmonary:     Effort: Pulmonary effort is normal. No respiratory distress.     Breath sounds: Normal breath sounds. No stridor. No wheezing, rhonchi or rales.  Abdominal:     General: Bowel sounds are normal. There is no distension.     Palpations: Abdomen is soft.     Tenderness: There  is no abdominal tenderness.  Musculoskeletal:     Right lower leg: No edema.     Left lower leg: No edema.  Skin:    Findings: No erythema or rash.  Neurological:     Mental Status: She is alert and oriented to person, place, and time.  Psychiatric:        Mood and Affect: Affect normal. Mood is anxious and depressed.        Speech: Speech normal.        Behavior: Behavior normal. Behavior is cooperative.        Thought Content: Thought content normal.         No results found for any visits on 05/09/23.  Assessment & Plan     Problem List Items Addressed This Visit       Musculoskeletal and Integument   Fungal toenail infection   Reports pain and structural issues with toes, including numbness and possible fungal changes. Difficulty managing foot care. - Refer to podiatry for evaluation and management      Relevant Orders   Ambulatory referral to Podiatry     Other   Recurrent major depression (HCC) - Primary   Depression and anxiety previously managed with Rexulti  and Zoloft . Discontinued Rexulti  due to insurance and pharmacy issues, worsening symptoms attributed to external stressors. Zoloft  100 mg inadequate. History of panic attacks, previously relieved with benzodiazepines. Klonopin  preferred over Xanax for longer duration and lower addiction potential. 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 Gabapentin  discontinued due to inefficacy and safety concerns. Under neurosurgery care but follow-up pending. - Discontinue gabapentin  - Encourage follow-up with neurosurgery      Hyperlipidemia   Previously on Crestor  for hyperlipidemia, discontinued due to lack of refills despite normal cholesterol levels. Restarting to prevent dyslipidemia. Continues CoQ10 for statin-associated leg cramps. - Restart Crestor  - Continue CoQ10      Relevant Medications   rosuvastatin  (CRESTOR ) 20 MG tablet   Coenzyme Q10 (COQ-10) 200 MG CAPS   Other Visit Diagnoses       Bilateral foot pain       Relevant Orders   Ambulatory referral to Podiatry       Assessment & Plan Post-surgical eye complications Experiencing worsening vision and pain in the left eye post-surgery. Photophobia impairs vision, preventing driving even during the day. Difficulty obtaining follow-up with [facility]; [facility] unresponsive. - Re-refer to [facility] for evaluation - Provide contact number for the eye center on the after-visit summary      Return in about 4 months (around 09/08/2023) for CHRONIC F/U.         Rockie Agent, MD  [facility] [phone] (phone) [phone] (fax)  [facility] "

## 2023-05-09 NOTE — Patient Instructions (Signed)
 Referral sent to [facility] Phone: [phone] - please call to schedule for referral submitted in Jan

## 2023-05-10 NOTE — Assessment & Plan Note (Signed)
 Reports pain and structural issues with toes, including numbness and possible fungal changes. Difficulty managing foot care. - Refer to podiatry for evaluation and management

## 2023-05-10 NOTE — Assessment & Plan Note (Signed)
 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 Gabapentin discontinued due to inefficacy and safety concerns. Under neurosurgery care but follow-up pending. - Discontinue gabapentin - Encourage follow-up with neurosurgery

## 2023-05-10 NOTE — Assessment & Plan Note (Signed)
 Depression and anxiety previously managed with Rexulti and Zoloft. Discontinued Rexulti due to insurance and pharmacy issues, worsening symptoms attributed to external stressors. Zoloft 100 mg inadequate. History of panic attacks, previously relieved with benzodiazepines. Klonopin preferred over Xanax for longer duration and lower addiction potential. - Increase Zoloft to 150 mg daily - Prescribe Klonopin 0.5 mg as needed for heightened anxiety or panic attacks, preferably at bedtime

## 2023-05-10 NOTE — Assessment & Plan Note (Signed)
 Previously on Crestor for hyperlipidemia, discontinued due to lack of refills despite normal cholesterol levels. Restarting to prevent dyslipidemia. Continues CoQ10 for statin-associated leg cramps. - Restart Crestor - Continue CoQ10

## 2023-05-24 ENCOUNTER — Ambulatory Visit (INDEPENDENT_AMBULATORY_CARE_PROVIDER_SITE_OTHER): Admitting: Podiatry

## 2023-05-24 DIAGNOSIS — B353 Tinea pedis: Secondary | ICD-10-CM | POA: Diagnosis not present

## 2023-05-24 DIAGNOSIS — L600 Ingrowing nail: Secondary | ICD-10-CM

## 2023-05-24 MED ORDER — TERBINAFINE HCL 250 MG PO TABS: 250.0000 mg | ORAL_TABLET | Freq: Every day | ORAL | 0 refills | Status: DC

## 2023-05-24 NOTE — Progress Notes (Signed)
 Subjective:  Patient ID: Shari Rivera, female    DOB: May 28, 1955,  MRN: [id number]  Chief Complaint  Patient presents with   Nail Problem    Nail trim     68 y.o. female presents with the above complaint.  Patient presents with left hallux lateral border ingrown painful to touch is progressive gotten worse worse with ambulation or shoe pressure patient will like to have removed has not seen MRIs prior to seeing me for this.  She would also has secondary complaint of athlete's foot to bilateral interdigital spaces.  She states is causing a lot of itching burning scaling between the toes she wanted to discuss treatment options for that she tried over-the-counter option which has not helped denies any other acute complaints.   Review of Systems: Negative except as noted in the HPI. Denies N/V/F/Ch.  Past Medical History:  Diagnosis Date   Allergy    Anemia    Anxiety    Arthritis    Cancer (HCC)    Carpal tunnel syndrome, bilateral    Cataract    COVID-19    Depression    Dyspnea    former smoker   Endometriosis    Family history of adverse reaction to anesthesia    Mother difficult to wake up   GERD (gastroesophageal reflux disease)    Pneumonia    Pre-diabetes     Current Outpatient Medications:    terbinafine (LAMISIL) 250 MG tablet, Take 1 tablet (250 mg total) by mouth daily., Disp: 30 tablet, Rfl: 0   alendronate  (FOSAMAX ) 70 MG tablet, TAKE 1 TABLET EVERY 7 DAYS WITH A FULL GLASS OF WATER ON AN EMPTY STOMACH DO NOT LIE DOWN FOR AT LEAST 30 MIN, Disp: 12 tablet, Rfl: 3   Calcium  Carbonate-Vitamin D 600-400 MG-UNIT tablet, Take 1 tablet by mouth daily., Disp: , Rfl:    clonazePAM  (KLONOPIN ) 0.5 MG tablet, Take 1 tablet (0.5 mg total) by mouth at bedtime as needed for anxiety., Disp: 30 tablet, Rfl: 1   Coenzyme Q10 (COQ-10) 200 MG CAPS, Take 200 mg by mouth at bedtime., Disp: 90 capsule, Rfl: 3   famotidine  (PEPCID ) 40 MG tablet, TAKE ONE TABLET BY MOUTH AT BEDTIME,  Disp: 30 tablet, Rfl: 6   fluticasone  (FLONASE ) 50 MCG/ACT nasal spray, Place 2 sprays into both nostrils daily., Disp: 16 g, Rfl: 6   Multiple Vitamin (MULTIVITAMIN WITH MINERALS) TABS tablet, Take 1 tablet by mouth daily., Disp: , Rfl:    Naproxen Sod-diphenhydrAMINE (ALEVE PM) 220-25 MG TABS, Take 2 capsules by mouth at bedtime., Disp: , Rfl:    naproxen sodium (ALEVE) 220 MG tablet, Take 220 mg by mouth daily as needed (pain)., Disp: , Rfl:    rosuvastatin  (CRESTOR ) 20 MG tablet, Take 1 tablet (20 mg total) by mouth daily., Disp: 90 tablet, Rfl: 3   sertraline  (ZOLOFT ) 100 MG tablet, Take 1.5 tablets (150 mg total) by mouth daily., Disp: 135 tablet, Rfl: 1  Social History   Tobacco Use  Smoking Status Former  Smokeless Tobacco Never    Allergies  Allergen Reactions   Codeine Diarrhea and Nausea And Vomiting   Objective:  There were no vitals filed for this visit. There is no height or weight on file to calculate BMI. Constitutional Well developed. Well nourished.  Vascular Dorsalis pedis pulses palpable bilaterally. Posterior tibial pulses palpable bilaterally. Capillary refill normal to all digits.  No cyanosis or clubbing noted. Pedal hair growth normal.  Neurologic Normal speech. Oriented to person, place,  and time. Epicritic sensation to light touch grossly present bilaterally.  Dermatologic Painful ingrowing nail at lateral nail borders of the hallux nail left. No other open wounds. Interdigital subjective complaint of itching noted with burning and skin epidermolysis.  No open wounds or lesion noted  Orthopedic: Normal joint ROM without pain or crepitus bilaterally. No visible deformities. No bony tenderness.   Radiographs: None Assessment:  No diagnosis found. Plan:  Patient was evaluated and treated and all questions answered.  Bilateral athlete's foot - All questions and concerns were discussed with the patient in extensive detail given the amount of  athlete's foot that is present patient would benefit from Lamisil oral medication for 30 days to help athlete's foot.  Patient agrees with plan would like to proceed with Lamisil.  She denies any liver issues  Ingrown Nail, left -Patient elects to proceed with minor surgery to remove ingrown toenail removal today. Consent reviewed and signed by patient. -Ingrown nail excised. See procedure note. -Educated on post-procedure care including soaking. Written instructions provided and reviewed. -Patient to follow up in 2 weeks for nail check.  Procedure: Excision of Ingrown Toenail Location: Left 1st toe lateral nail borders. Anesthesia: Lidocaine  1% plain; 1.5 mL and Marcaine  0.5% plain; 1.5 mL, digital block. Skin Prep: Betadine. Dressing: Silvadene; telfa; dry, sterile, compression dressing. Technique: Following skin prep, the toe was exsanguinated and a tourniquet was secured at the base of the toe. The affected nail border was freed, split with a nail splitter, and excised. Chemical matrixectomy was then performed with phenol and irrigated out with alcohol. The tourniquet was then removed and sterile dressing applied. Disposition: Patient tolerated procedure well. Patient to return in 2 weeks for follow-up.   No follow-ups on file.

## 2023-05-24 NOTE — Patient Instructions (Signed)

## 2023-06-06 ENCOUNTER — Telehealth: Payer: Self-pay | Admitting: *Deleted

## 2023-06-06 MED ORDER — NA SULFATE-K SULFATE-MG SULF 17.5-3.13-1.6 GM/177ML PO SOLN: 1.0000 | Freq: Once | ORAL | 0 refills | Status: AC

## 2023-06-06 NOTE — Telephone Encounter (Signed)
 Patient called office in need to reschedule because she is sick, coughing, nasal congestion.  Requesting to reschedule to 06/28/2023.  Called endo unit to make the change.

## 2023-06-23 ENCOUNTER — Other Ambulatory Visit: Payer: Self-pay | Admitting: Family Medicine

## 2023-06-23 DIAGNOSIS — Z1231 Encounter for screening mammogram for malignant neoplasm of breast: Secondary | ICD-10-CM

## 2023-06-28 ENCOUNTER — Ambulatory Visit
Admission: RE | Admit: 2023-06-28 | Discharge: 2023-06-28 | Disposition: A | Discharge status: Home / Self Care | Attending: Gastroenterology | Admitting: Gastroenterology

## 2023-06-28 ENCOUNTER — Encounter: Payer: Self-pay | Admitting: Gastroenterology

## 2023-06-28 ENCOUNTER — Ambulatory Visit: Admitting: Anesthesiology

## 2023-06-28 ENCOUNTER — Encounter
Admission: RE | Disposition: A | Discharge status: Home / Self Care | Payer: Self-pay | Source: Home / Self Care | Attending: Gastroenterology

## 2023-06-28 DIAGNOSIS — K640 First degree hemorrhoids: Secondary | ICD-10-CM | POA: Diagnosis not present

## 2023-06-28 DIAGNOSIS — K219 Gastro-esophageal reflux disease without esophagitis: Secondary | ICD-10-CM | POA: Insufficient documentation

## 2023-06-28 DIAGNOSIS — F419 Anxiety disorder, unspecified: Secondary | ICD-10-CM | POA: Diagnosis not present

## 2023-06-28 DIAGNOSIS — Z1211 Encounter for screening for malignant neoplasm of colon: Secondary | ICD-10-CM | POA: Diagnosis present

## 2023-06-28 DIAGNOSIS — G4730 Sleep apnea, unspecified: Secondary | ICD-10-CM | POA: Diagnosis not present

## 2023-06-28 DIAGNOSIS — Z79899 Other long term (current) drug therapy: Secondary | ICD-10-CM | POA: Insufficient documentation

## 2023-06-28 DIAGNOSIS — M1990 Unspecified osteoarthritis, unspecified site: Secondary | ICD-10-CM | POA: Insufficient documentation

## 2023-06-28 DIAGNOSIS — Z87891 Personal history of nicotine dependence: Secondary | ICD-10-CM | POA: Diagnosis not present

## 2023-06-28 DIAGNOSIS — F32A Depression, unspecified: Secondary | ICD-10-CM | POA: Insufficient documentation

## 2023-06-28 DIAGNOSIS — D649 Anemia, unspecified: Secondary | ICD-10-CM | POA: Insufficient documentation

## 2023-06-28 DIAGNOSIS — R7303 Prediabetes: Secondary | ICD-10-CM | POA: Insufficient documentation

## 2023-06-28 DIAGNOSIS — K5730 Diverticulosis of large intestine without perforation or abscess without bleeding: Secondary | ICD-10-CM | POA: Diagnosis not present

## 2023-06-28 HISTORY — PX: COLONOSCOPY: SHX5424

## 2023-06-28 SURGERY — COLONOSCOPY
Anesthesia: General

## 2023-06-28 MED ORDER — GLYCOPYRROLATE 0.2 MG/ML IJ SOLN
INTRAMUSCULAR | Status: DC | PRN
Start: 2023-06-28 — End: 2023-06-28
  Administered 2023-06-28: .2 mg via INTRAVENOUS

## 2023-06-28 MED ORDER — SODIUM CHLORIDE 0.9 % IV SOLN
INTRAVENOUS | Status: DC
Start: 2023-06-28 — End: 2023-06-28

## 2023-06-28 MED ORDER — PROPOFOL 500 MG/50ML IV EMUL
INTRAVENOUS | Status: DC | PRN
  Administered 2023-06-28: 165 ug/kg/min via INTRAVENOUS

## 2023-06-28 MED ORDER — LIDOCAINE HCL (CARDIAC) PF 100 MG/5ML IV SOSY
PREFILLED_SYRINGE | INTRAVENOUS | Status: DC | PRN
  Administered 2023-06-28: 60 mg via INTRAVENOUS

## 2023-06-28 MED ORDER — PROPOFOL 10 MG/ML IV BOLUS
INTRAVENOUS | Status: DC | PRN
  Administered 2023-06-28: 60 mg via INTRAVENOUS

## 2023-06-28 NOTE — Op Note (Signed)
 [facility] Gastroenterology Patient Name: Shari Rivera Procedure Date: 06/28/2023 8:06 AM MRN: [id number] Account #: [id number] Date of Birth: 30-Mar-1955 Admit Type: Outpatient Age: 68 Room: [facility] ENDO ROOM 4 Gender: Female Note Status: Finalized Instrument Name: Charlyn Cooley [id number] Procedure:             Colonoscopy Indications:           Screening for colorectal malignant neoplasm Providers:             Marnee Sink MD, MD Referring MD:          Dori Garbe (Referring MD) Medicines:             Propofol  per Anesthesia Complications:         No immediate complications. Procedure:             Pre-Anesthesia Assessment:                        - Prior to the procedure, a History and Physical was                         performed, and patient medications and allergies were                         reviewed. The patient's tolerance of previous                         anesthesia was also reviewed. The risks and benefits                         of the procedure and the sedation options and risks                         were discussed with the patient. All questions were                         answered, and informed consent was obtained. Prior                         Anticoagulants: The patient has taken no anticoagulant                         or antiplatelet agents. ASA Grade Assessment: II - A                         patient with mild systemic disease. After reviewing                         the risks and benefits, the patient was deemed in                         satisfactory condition to undergo the procedure.                        After obtaining informed consent, the colonoscope was                         passed under direct vision. Throughout the procedure,  the patient's blood pressure, pulse, and oxygen                         saturations were monitored continuously. The                         Colonoscope was introduced through the  anus and                         advanced to the the terminal ileum. The colonoscopy                         was performed without difficulty. The patient                         tolerated the procedure well. The quality of the bowel                         preparation was excellent. Findings:      The perianal and digital rectal examinations were normal.      Multiple small-mouthed diverticula were found in the entire colon.      Non-bleeding internal hemorrhoids were found during retroflexion. The       hemorrhoids were Grade I (internal hemorrhoids that do not prolapse). Impression:            - Diverticulosis in the entire examined colon.                        - Non-bleeding internal hemorrhoids.                        - No specimens collected. Recommendation:        - Discharge patient to home.                        - Resume previous diet.                        - Continue present medications.                        - Repeat colonoscopy is not recommended due to current                         age (25 years or older) for screening purposes. Procedure Code(s):     --- Professional ---                        [phone], Colonoscopy, flexible; diagnostic, including                         collection of specimen(s) by brushing or washing, when                         performed (separate procedure) Diagnosis Code(s):     --- Professional ---                        Z12.11, Encounter for screening for malignant neoplasm  of colon CPT copyright 2022 American [employer] Association. All rights reserved. The codes documented in this report are preliminary and upon coder review may  be revised to meet current compliance requirements. Marnee Sink MD, MD 06/28/2023 8:34:33 AM This report has been signed electronically. Number of Addenda: 0 Note Initiated On: 06/28/2023 8:06 AM Scope Withdrawal Time: 0 hours 8 minutes 9 seconds  Total Procedure Duration: 0 hours 17 minutes 4  seconds  Estimated Blood Loss:  Estimated blood loss: none.      [facility]

## 2023-06-28 NOTE — Anesthesia Procedure Notes (Signed)
 Procedure Name: General with mask airway Date/Time: 06/28/2023 8:16 AM  Performed by: Niki Barter, CRNAPre-anesthesia Checklist: Patient identified, Emergency Drugs available, Suction available and Patient being monitored Patient Re-evaluated:Patient Re-evaluated prior to induction Oxygen Delivery Method: Simple face mask Induction Type: IV induction Placement Confirmation: positive ETCO2 and breath sounds checked- equal and bilateral Dental Injury: Teeth and Oropharynx as per pre-operative assessment

## 2023-06-28 NOTE — Anesthesia Preprocedure Evaluation (Addendum)
 Anesthesia Evaluation  Patient identified by MRN, date of birth, ID band Patient awake    Reviewed: Allergy & Precautions, NPO status , Patient's Chart, lab work & pertinent test results  History of Anesthesia Complications Negative for: history of anesthetic complications  Airway Mallampati: I   Neck ROM: Full    Dental  (+) Edentulous Upper, Edentulous Lower   Pulmonary sleep apnea and Continuous Positive Airway Pressure Ventilation , former smoker (quit 20 years ago)   Pulmonary exam normal breath sounds clear to auscultation       Cardiovascular Exercise Tolerance: Good negative cardio ROS Normal cardiovascular exam Rhythm:Regular Rate:Normal     Neuro/Psych  PSYCHIATRIC DISORDERS Anxiety Depression       GI/Hepatic ,GERD  ,,  Endo/Other  Prediabetes   Renal/GU negative Renal ROS     Musculoskeletal  (+) Arthritis ,    Abdominal   Peds  Hematology  (+) Blood dyscrasia, anemia   Anesthesia Other Findings   Reproductive/Obstetrics                             Anesthesia Physical Anesthesia Plan  ASA: 2  Anesthesia Plan: General   Post-op Pain Management:    Induction: Intravenous  PONV Risk Score and Plan: 3 and Propofol  infusion, TIVA and Treatment may vary due to age or medical condition  Airway Management Planned: Natural Airway  Additional Equipment:   Intra-op Plan:   Post-operative Plan:   Informed Consent: I have reviewed the patients History and Physical, chart, labs and discussed the procedure including the risks, benefits and alternatives for the proposed anesthesia with the patient or authorized representative who has indicated his/her understanding and acceptance.       Plan Discussed with: CRNA  Anesthesia Plan Comments: (LMA/GETA backup discussed.  Patient consented for risks of anesthesia including but not limited to:  - adverse reactions to  medications - damage to eyes, teeth, lips or other oral mucosa - nerve damage due to positioning  - sore throat or hoarseness - damage to heart, brain, nerves, lungs, other parts of body or loss of life  Informed patient about role of CRNA in peri- and intra-operative care.  Patient voiced understanding.)        Anesthesia Quick Evaluation

## 2023-06-28 NOTE — Addendum Note (Signed)
 Addendum  created 06/28/23 1004 by Niki Barter, CRNA   Child order released for a procedure order, Clinical Note Signed, Flowsheet accepted, Intraprocedure Blocks edited, SmartForm saved

## 2023-06-28 NOTE — Anesthesia Postprocedure Evaluation (Signed)
 Anesthesia Post Note  Patient: Shari Rivera  Procedure(s) Performed: COLONOSCOPY  Patient location during evaluation: PACU Anesthesia Type: General Level of consciousness: awake and alert, oriented and patient cooperative Pain management: pain level controlled Vital Signs Assessment: post-procedure vital signs reviewed and stable Respiratory status: spontaneous breathing, nonlabored ventilation and respiratory function stable Cardiovascular status: blood pressure returned to baseline and stable Postop Assessment: adequate PO intake Anesthetic complications: no   No notable events documented.   Last Vitals:  Vitals:   06/28/23 0847 06/28/23 0857  BP: 102/81 122/84  Pulse:    Resp:    Temp:    SpO2:      Last Pain:  Vitals:   06/28/23 0857  TempSrc:   PainSc: 0-No pain                 Dorothey Gate

## 2023-06-28 NOTE — Transfer of Care (Signed)
 Immediate Anesthesia Transfer of Care Note  Patient: Shari Rivera  Procedure(s) Performed: COLONOSCOPY  Patient Location: Endoscopy Unit  Anesthesia Type:General  Level of Consciousness: drowsy and patient cooperative  Airway & Oxygen Therapy: Patient Spontanous Breathing and Patient connected to face mask oxygen  Post-op Assessment: Report given to RN and Post -op Vital signs reviewed and stable  Post vital signs: Reviewed and stable  Last Vitals:  Vitals Value Taken Time  BP 96/62 06/28/23 0837  Temp 35.6 C 06/28/23 0837  Pulse 109 06/28/23 0838  Resp 16 06/28/23 0838  SpO2 100 % 06/28/23 0838  Vitals shown include unfiled device data.  Last Pain:  Vitals:   06/28/23 0837  TempSrc: Temporal         Complications: No notable events documented.

## 2023-06-28 NOTE — H&P (Signed)
 Marnee Sink, MD [facility] [address], [address] [place], [place] [address] Phone: [phone] Fax : [phone]  Primary Care Physician:  Mimi Alt, MD Primary Gastroenterologist:  Dr. Ole Berkeley  Pre-Procedure History & Physical: HPI:  Shari Rivera is a 68 y.o. female is here for a screening colonoscopy.   Past Medical History:  Diagnosis Date   Allergy    Anemia    Anxiety    Arthritis    Cancer (HCC)    Carpal tunnel syndrome, bilateral    Cataract    COVID-19    Depression    Dyspnea    former smoker   Endometriosis    Family history of adverse reaction to anesthesia    Mother difficult to wake up   GERD (gastroesophageal reflux disease)    Pneumonia    Pre-diabetes     Past Surgical History:  Procedure Laterality Date   APPENDECTOMY     CARPAL TUNNEL RELEASE Left 11/30/2022   Procedure: LEFT CARPAL TUNNEL RELEASE WITH ULTRASOUND GUIDANCE;  Surgeon: Carroll Clamp, MD;  Location: [facility] ORS;  Service: Neurosurgery;  Laterality: Left;  LOCAL W/ MAC   DIAGNOSTIC LAPAROSCOPY     endometrosis   EYE SURGERY     cataract bilateral with lens replaced   NASAL SEPTUM SURGERY     SMALL INTESTINE SURGERY      Prior to Admission medications   Medication Sig Start Date End Date Taking? Authorizing Provider  alendronate  (FOSAMAX ) 70 MG tablet TAKE 1 TABLET EVERY 7 DAYS WITH A FULL GLASS OF WATER ON AN EMPTY STOMACH DO NOT LIE DOWN FOR AT LEAST 30 MIN 11/12/22   Simmons-Robinson, Makiera, MD  Calcium  Carbonate-Vitamin D 600-400 MG-UNIT tablet Take 1 tablet by mouth daily.    [provider]  clonazePAM  (KLONOPIN ) 0.5 MG tablet Take 1 tablet (0.5 mg total) by mouth at bedtime as needed for anxiety. 05/09/23   Simmons-Robinson, Makiera, MD  Coenzyme Q10 (COQ-10) 200 MG CAPS Take 200 mg by mouth at bedtime. 05/09/23   Simmons-Robinson, Makiera, MD  famotidine  (PEPCID ) 40 MG tablet TAKE ONE TABLET BY MOUTH AT BEDTIME 09/28/22   Simmons-Robinson, Makiera, MD   fluticasone  (FLONASE ) 50 MCG/ACT nasal spray Place 2 sprays into both nostrils daily. 06/10/22   Simmons-Robinson, Judyann Number, MD  Multiple Vitamin (MULTIVITAMIN WITH MINERALS) TABS tablet Take 1 tablet by mouth daily.    [provider]  Naproxen Sod-diphenhydrAMINE (ALEVE PM) 220-25 MG TABS Take 2 capsules by mouth at bedtime.    [provider]  naproxen sodium (ALEVE) 220 MG tablet Take 220 mg by mouth daily as needed (pain).    [provider]  rosuvastatin  (CRESTOR ) 20 MG tablet Take 1 tablet (20 mg total) by mouth daily. 05/09/23   Simmons-Robinson, Judyann Number, MD  sertraline  (ZOLOFT ) 100 MG tablet Take 1.5 tablets (150 mg total) by mouth daily. 05/09/23 11/05/23  Simmons-Robinson, Judyann Number, MD  terbinafine  (LAMISIL ) 250 MG tablet Take 1 tablet (250 mg total) by mouth daily. Patient not taking: Reported on 06/28/2023 05/24/23   Velma Ghazi, DPM    Allergies as of 05/02/2023 - Review Complete 02/07/2023  Allergen Reaction Noted   Codeine Diarrhea and Nausea And Vomiting 05/27/2015    Family History  Problem Relation Age of Onset   Cancer Mother    Stroke Mother    COPD Mother    Heart failure Mother    Atrial fibrillation Mother    Heart disease Father    Cancer Maternal Grandmother  Diabetes Maternal Grandmother    Kidney disease Maternal Grandfather    Heart disease Maternal Grandfather    Heart disease Paternal Grandfather    Breast cancer Other        mggm    Social History   Socioeconomic History   Marital status: Single    Spouse name: Not on file   Number of children: Not on file   Years of education: Not on file   Highest education level: Not on file  Occupational History   Not on file  Tobacco Use   Smoking status: Former   Smokeless tobacco: Never  Vaping Use   Vaping status: Former  Substance and Sexual Activity   Alcohol use: No   Drug use: Yes    Types: Marijuana    Comment: occas   Sexual activity: Not on file  Other Topics  Concern   Not on file  Social History Narrative   Lives alone , sister will be with her after surgery   Social Drivers of Health   Financial Resource Strain: Low Risk  (09/15/2022)   Overall Financial Resource Strain (CARDIA)    Difficulty of Paying Living Expenses: Not hard at all  Food Insecurity: No Food Insecurity (09/15/2022)   Hunger Vital Sign    Worried About Running Out of Food in the Last Year: Never true    Ran Out of Food in the Last Year: Never true  Transportation Needs: No Transportation Needs (09/15/2022)   PRAPARE - Administrator, Civil Service (Medical): No    Lack of Transportation (Non-Medical): No  Physical Activity: Sufficiently Active (09/15/2022)   Exercise Vital Sign    Days of Exercise per Week: 5 days    Minutes of Exercise per Session: 40 min  Stress: Stress Concern Present (09/15/2022)   [employer] of Occupational Health - Occupational Stress Questionnaire    Feeling of Stress : To some extent  Social Connections: Moderately Isolated (09/15/2022)   Social Connection and Isolation Panel [NHANES]    Frequency of Communication with Friends and Family: More than three times a week    Frequency of Social Gatherings with Friends and Family: Twice a week    Attends Religious Services: More than 4 times per year    Active Member of [employer] or Organizations: No    Attends Banker Meetings: Never    Marital Status: Never married  Intimate Partner Violence: Not At Risk (09/15/2022)   Humiliation, Afraid, Rape, and Kick questionnaire    Fear of Current or Ex-Partner: No    Emotionally Abused: No    Physically Abused: No    Sexually Abused: No    Review of Systems: See HPI, otherwise negative ROS  Physical Exam: BP 125/69   Pulse 79   Temp (!) 96.8 F (36 C) (Temporal)   Resp 18   Ht 5\' 3"  (1.6 m)   Wt 53.1 kg   SpO2 100%   BMI 20.73 kg/m  General:   Alert,  pleasant and cooperative in NAD Head:  Normocephalic and  atraumatic. Neck:  Supple; no masses or thyromegaly. Lungs:  Clear throughout to auscultation.    Heart:  Regular rate and rhythm. Abdomen:  Soft, nontender and nondistended. Normal bowel sounds, without guarding, and without rebound.   Neurologic:  Alert and  oriented x4;  grossly normal neurologically.  Impression/Plan: Shari Rivera is now here to undergo a screening colonoscopy.  Risks, benefits, and alternatives regarding colonoscopy have been reviewed with the  patient.  Questions have been answered.  All parties agreeable.

## 2023-07-07 ENCOUNTER — Inpatient Hospital Stay: Admission: RE | Admit: 2023-07-07 | Source: Ambulatory Visit

## 2023-07-27 ENCOUNTER — Ambulatory Visit: Admitting: Neurosurgery

## 2023-08-01 ENCOUNTER — Inpatient Hospital Stay
Admission: EM | Admit: 2023-08-01 | Discharge: 2023-08-04 | DRG: 872 | Disposition: A | Discharge status: Home / Self Care | Attending: Student | Admitting: Student

## 2023-08-01 ENCOUNTER — Encounter: Payer: Self-pay | Admitting: Emergency Medicine

## 2023-08-01 ENCOUNTER — Emergency Department

## 2023-08-01 ENCOUNTER — Other Ambulatory Visit: Payer: Self-pay

## 2023-08-01 DIAGNOSIS — E785 Hyperlipidemia, unspecified: Secondary | ICD-10-CM | POA: Diagnosis present

## 2023-08-01 DIAGNOSIS — F1210 Cannabis abuse, uncomplicated: Secondary | ICD-10-CM | POA: Diagnosis present

## 2023-08-01 DIAGNOSIS — Z87891 Personal history of nicotine dependence: Secondary | ICD-10-CM | POA: Diagnosis not present

## 2023-08-01 DIAGNOSIS — F339 Major depressive disorder, recurrent, unspecified: Secondary | ICD-10-CM | POA: Diagnosis present

## 2023-08-01 DIAGNOSIS — E876 Hypokalemia: Secondary | ICD-10-CM | POA: Diagnosis present

## 2023-08-01 DIAGNOSIS — Z79899 Other long term (current) drug therapy: Secondary | ICD-10-CM

## 2023-08-01 DIAGNOSIS — Z7983 Long term (current) use of bisphosphonates: Secondary | ICD-10-CM | POA: Diagnosis not present

## 2023-08-01 DIAGNOSIS — Z885 Allergy status to narcotic agent status: Secondary | ICD-10-CM | POA: Diagnosis not present

## 2023-08-01 DIAGNOSIS — Z833 Family history of diabetes mellitus: Secondary | ICD-10-CM

## 2023-08-01 DIAGNOSIS — R112 Nausea with vomiting, unspecified: Principal | ICD-10-CM | POA: Insufficient documentation

## 2023-08-01 DIAGNOSIS — Z9049 Acquired absence of other specified parts of digestive tract: Secondary | ICD-10-CM | POA: Diagnosis not present

## 2023-08-01 DIAGNOSIS — K529 Noninfective gastroenteritis and colitis, unspecified: Secondary | ICD-10-CM | POA: Diagnosis present

## 2023-08-01 DIAGNOSIS — R1314 Dysphagia, pharyngoesophageal phase: Secondary | ICD-10-CM | POA: Diagnosis present

## 2023-08-01 DIAGNOSIS — A419 Sepsis, unspecified organism: Principal | ICD-10-CM | POA: Diagnosis present

## 2023-08-01 DIAGNOSIS — Z1152 Encounter for screening for COVID-19: Secondary | ICD-10-CM | POA: Diagnosis not present

## 2023-08-01 DIAGNOSIS — A4151 Sepsis due to Escherichia coli [E. coli]: Secondary | ICD-10-CM | POA: Diagnosis present

## 2023-08-01 DIAGNOSIS — R1319 Other dysphagia: Secondary | ICD-10-CM | POA: Diagnosis present

## 2023-08-01 DIAGNOSIS — E872 Acidosis, unspecified: Secondary | ICD-10-CM | POA: Diagnosis present

## 2023-08-01 DIAGNOSIS — F32A Depression, unspecified: Secondary | ICD-10-CM | POA: Insufficient documentation

## 2023-08-01 DIAGNOSIS — Z8249 Family history of ischemic heart disease and other diseases of the circulatory system: Secondary | ICD-10-CM | POA: Diagnosis not present

## 2023-08-01 DIAGNOSIS — K219 Gastro-esophageal reflux disease without esophagitis: Secondary | ICD-10-CM | POA: Diagnosis present

## 2023-08-01 DIAGNOSIS — F419 Anxiety disorder, unspecified: Secondary | ICD-10-CM | POA: Diagnosis present

## 2023-08-01 DIAGNOSIS — R1084 Generalized abdominal pain: Secondary | ICD-10-CM

## 2023-08-01 LAB — CBC WITH DIFFERENTIAL/PLATELET
Abs Immature Granulocytes: 0.09 K/uL — ABNORMAL HIGH (ref 0.00–0.07)
Basophils Absolute: 0 K/uL (ref 0.0–0.1)
Basophils Relative: 0 %
Eosinophils Absolute: 0.1 K/uL (ref 0.0–0.5)
Eosinophils Relative: 1 %
HCT: 36.6 % (ref 36.0–46.0)
Hemoglobin: 12.6 g/dL (ref 12.0–15.0)
Immature Granulocytes: 1 %
Lymphocytes Relative: 10 %
Lymphs Abs: 1.5 K/uL (ref 0.7–4.0)
MCH: 31.7 pg (ref 26.0–34.0)
MCHC: 34.4 g/dL (ref 30.0–36.0)
MCV: 92.2 fL (ref 80.0–100.0)
Monocytes Absolute: 1.3 K/uL — ABNORMAL HIGH (ref 0.1–1.0)
Monocytes Relative: 9 %
Neutro Abs: 11.9 K/uL — ABNORMAL HIGH (ref 1.7–7.7)
Neutrophils Relative %: 79 %
Platelets: 241 K/uL (ref 150–400)
RBC: 3.97 MIL/uL (ref 3.87–5.11)
RDW: 12.3 % (ref 11.5–15.5)
WBC: 14.8 K/uL — ABNORMAL HIGH (ref 4.0–10.5)
nRBC: 0 % (ref 0.0–0.2)

## 2023-08-01 LAB — COMPREHENSIVE METABOLIC PANEL WITH GFR
ALT: 18 U/L (ref 0–44)
AST: 22 U/L (ref 15–41)
Albumin: 3.6 g/dL (ref 3.5–5.0)
Alkaline Phosphatase: 75 U/L (ref 38–126)
Anion gap: 12 (ref 5–15)
BUN: 11 mg/dL (ref 8–23)
CO2: 20 mmol/L — ABNORMAL LOW (ref 22–32)
Calcium: 9.6 mg/dL (ref 8.9–10.3)
Chloride: 105 mmol/L (ref 98–111)
Creatinine, Ser: 0.99 mg/dL (ref 0.44–1.00)
GFR, Estimated: >60 mL/min (ref >60)
Glucose, Bld: 125 mg/dL — ABNORMAL HIGH (ref 70–99)
Potassium: 3.1 mmol/L — ABNORMAL LOW (ref 3.5–5.1)
Sodium: 137 mmol/L (ref 135–145)
Total Bilirubin: 0.5 mg/dL (ref 0.0–1.2)
Total Protein: 7 g/dL (ref 6.5–8.1)

## 2023-08-01 LAB — RESP PANEL BY RT-PCR (RSV, FLU A&B, COVID)  RVPGX2
Influenza A by PCR: NEGATIVE
Influenza B by PCR: NEGATIVE
Resp Syncytial Virus by PCR: NEGATIVE
SARS Coronavirus 2 by RT PCR: NEGATIVE

## 2023-08-01 LAB — URINALYSIS, W/ REFLEX TO CULTURE (INFECTION SUSPECTED)
Bacteria, UA: NONE SEEN
Bilirubin Urine: NEGATIVE
Glucose, UA: NEGATIVE mg/dL
Hgb urine dipstick: NEGATIVE
Ketones, ur: NEGATIVE mg/dL
Leukocytes,Ua: NEGATIVE
Nitrite: NEGATIVE
Protein, ur: NEGATIVE mg/dL
Specific Gravity, Urine: 1.023 (ref 1.005–1.030)
pH: 6 (ref 5.0–8.0)

## 2023-08-01 LAB — TROPONIN I (HIGH SENSITIVITY)
Troponin I (High Sensitivity): 4 ng/L (ref <18)
Troponin I (High Sensitivity): 6 ng/L (ref <18)

## 2023-08-01 LAB — MAGNESIUM: Magnesium: 1.5 mg/dL — ABNORMAL LOW (ref 1.7–2.4)

## 2023-08-01 LAB — LACTIC ACID, PLASMA
Lactic Acid, Venous: 2.8 mmol/L (ref 0.5–1.9)
Lactic Acid, Venous: 3.2 mmol/L (ref 0.5–1.9)
Lactic Acid, Venous: 1.9 mmol/L (ref 0.5–1.9)

## 2023-08-01 MED ORDER — METRONIDAZOLE 500 MG/100ML IV SOLN
500.0000 mg | Freq: Once | INTRAVENOUS | Status: AC
  Administered 2023-08-01: 500 mg via INTRAVENOUS
  Filled 2023-08-01: qty 100

## 2023-08-01 MED ORDER — METRONIDAZOLE 500 MG/100ML IV SOLN
500.0000 mg | Freq: Two times a day (BID) | INTRAVENOUS | Status: DC
  Administered 2023-08-02: 500 mg via INTRAVENOUS
  Filled 2023-08-01: qty 100

## 2023-08-01 MED ORDER — POTASSIUM CHLORIDE 10 MEQ/100ML IV SOLN
10.0000 meq | INTRAVENOUS | Status: AC
  Administered 2023-08-01 – 2023-08-02 (×2): 10 meq via INTRAVENOUS
  Filled 2023-08-01 (×2): qty 100

## 2023-08-01 MED ORDER — POTASSIUM CHLORIDE 10 MEQ/100ML IV SOLN
10.0000 meq | INTRAVENOUS | Status: AC
  Administered 2023-08-01 (×2): 10 meq via INTRAVENOUS
  Filled 2023-08-01 (×2): qty 100

## 2023-08-01 MED ORDER — ACETAMINOPHEN 325 MG PO TABS
650.0000 mg | ORAL_TABLET | Freq: Four times a day (QID) | ORAL | Status: DC | PRN
Start: 2023-08-01 — End: 2023-08-06
  Administered 2023-08-02 – 2023-08-03 (×3): 650 mg via ORAL
  Filled 2023-08-01 (×3): qty 2

## 2023-08-01 MED ORDER — ONDANSETRON HCL 4 MG/2ML IJ SOLN
4.0000 mg | Freq: Four times a day (QID) | INTRAMUSCULAR | Status: DC | PRN
  Administered 2023-08-03 – 2023-08-04 (×3): 4 mg via INTRAVENOUS
  Filled 2023-08-01 (×3): qty 2

## 2023-08-01 MED ORDER — ACETAMINOPHEN 650 MG RE SUPP: 650.0000 mg | Freq: Four times a day (QID) | RECTAL | Status: DC | PRN

## 2023-08-01 MED ORDER — SODIUM CHLORIDE 0.9 % IV BOLUS (SEPSIS)
1000.0000 mL | Freq: Once | INTRAVENOUS | Status: AC
  Administered 2023-08-01: 1000 mL via INTRAVENOUS

## 2023-08-01 MED ORDER — SODIUM CHLORIDE 0.9 % IV SOLN
2.0000 g | Freq: Once | INTRAVENOUS | Status: AC
  Administered 2023-08-01: 2 g via INTRAVENOUS
  Filled 2023-08-01: qty 20

## 2023-08-01 MED ORDER — ROSUVASTATIN CALCIUM 10 MG PO TABS
20.0000 mg | ORAL_TABLET | Freq: Every day | ORAL | Status: DC
  Administered 2023-08-02 – 2023-08-04 (×3): 20 mg via ORAL
  Filled 2023-08-01: qty 2
  Filled 2023-08-01: qty 1
  Filled 2023-08-01: qty 2

## 2023-08-01 MED ORDER — CLONAZEPAM 0.5 MG PO TABS: 0.5000 mg | ORAL_TABLET | Freq: Every evening | ORAL | Status: DC | PRN

## 2023-08-01 MED ORDER — SODIUM CHLORIDE 0.9 % IV SOLN
2.0000 g | Freq: Two times a day (BID) | INTRAVENOUS | Status: DC
  Administered 2023-08-01 – 2023-08-02 (×2): 2 g via INTRAVENOUS
  Filled 2023-08-01 (×2): qty 12.5

## 2023-08-01 MED ORDER — ONDANSETRON HCL 4 MG PO TABS: 4.0000 mg | ORAL_TABLET | Freq: Four times a day (QID) | ORAL | Status: DC | PRN

## 2023-08-01 MED ORDER — ADULT MULTIVITAMIN W/MINERALS CH
1.0000 | ORAL_TABLET | Freq: Every day | ORAL | Status: DC
  Administered 2023-08-02 – 2023-08-04 (×3): 1 via ORAL
  Filled 2023-08-01 (×3): qty 1

## 2023-08-01 MED ORDER — MAGNESIUM SULFATE IN D5W 1-5 GM/100ML-% IV SOLN
1.0000 g | Freq: Once | INTRAVENOUS | Status: AC
  Administered 2023-08-02: 1 g via INTRAVENOUS
  Filled 2023-08-01: qty 100

## 2023-08-01 MED ORDER — IOHEXOL 350 MG/ML SOLN
75.0000 mL | Freq: Once | INTRAVENOUS | Status: AC | PRN
  Administered 2023-08-01: 75 mL via INTRAVENOUS

## 2023-08-01 MED ORDER — VANCOMYCIN HCL 750 MG/150ML IV SOLN: 750.0000 mg | INTRAVENOUS | Status: DC

## 2023-08-01 MED ORDER — SODIUM CHLORIDE 0.9 % IV SOLN
12.5000 mg | Freq: Once | INTRAVENOUS | Status: AC
  Administered 2023-08-01: 12.5 mg via INTRAVENOUS
  Filled 2023-08-01: qty 12.5

## 2023-08-01 MED ORDER — SODIUM CHLORIDE 0.9 % IV BOLUS
1000.0000 mL | Freq: Once | INTRAVENOUS | Status: AC
  Administered 2023-08-01: 1000 mL via INTRAVENOUS

## 2023-08-01 MED ORDER — FAMOTIDINE 20 MG PO TABS
40.0000 mg | ORAL_TABLET | Freq: Every day | ORAL | Status: DC
  Administered 2023-08-01: 40 mg via ORAL
  Filled 2023-08-01: qty 2

## 2023-08-01 MED ORDER — VANCOMYCIN HCL 1250 MG/250ML IV SOLN
1250.0000 mg | Freq: Once | INTRAVENOUS | Status: AC
  Administered 2023-08-02: 1250 mg via INTRAVENOUS
  Filled 2023-08-01: qty 250

## 2023-08-01 MED ORDER — SERTRALINE HCL 50 MG PO TABS
150.0000 mg | ORAL_TABLET | Freq: Every day | ORAL | Status: DC
  Administered 2023-08-02 – 2023-08-04 (×3): 150 mg via ORAL
  Filled 2023-08-01 (×3): qty 3

## 2023-08-01 MED ORDER — SODIUM CHLORIDE 0.9 % IV SOLN: 12.5000 mg | Freq: Four times a day (QID) | INTRAVENOUS | Status: AC | PRN

## 2023-08-01 MED ORDER — HEPARIN SODIUM (PORCINE) 5000 UNIT/ML IJ SOLN
5000.0000 [IU] | Freq: Three times a day (TID) | INTRAMUSCULAR | Status: DC
  Administered 2023-08-01 – 2023-08-02 (×2): 5000 [IU] via SUBCUTANEOUS
  Filled 2023-08-01 (×5): qty 1

## 2023-08-01 MED ORDER — LACTATED RINGERS IV SOLN: INTRAVENOUS | Status: DC

## 2023-08-01 NOTE — Consult Note (Signed)
 Pharmacy Antibiotic Note  ASSESSMENT: 68 y.o. female with PMH including esophageal dysphagia, urine leakage is presenting with sepsis from unknown source. Patient tachycardic with leukocytosis. Imaging thus far unremarkable. Pharmacy has been consulted to manage cefepime  and vancomycin  dosing. Patient also on metronidazole .  PLAN: Initiate cefepime  2g IV q12H Administer vancomycin  1250mg  IV x 1 followed by 750mg  IV q24H thereafter Follow up culture results to assess for antibiotic optimization. Monitor renal function to assess for any necessary antibiotic dosing changes.  Patient measurements: Height: 5' 3 (160 cm) Weight: 53 kg (116 lb 13.5 oz) IBW/kg (Calculated) : 52.4  Vital signs: Temp: 97.9 F (36.6 C) (07/07 1540) Temp Source: Oral (07/07 1540) BP: 128/38 (07/07 1930) Pulse Rate: 91 (07/07 2120) Recent Labs  Lab 08/01/23 1347  WBC 14.8*  CREATININE 0.99   Estimated Creatinine Clearance: 45 mL/min (by C-G formula based on SCr of 0.99 mg/dL).  Allergies: Allergies  Allergen Reactions   Codeine Diarrhea and Nausea And Vomiting    Antimicrobials this admission: Cefepime  7/7 >> Ceftriaxone  7/7 x 1 Vancomycin  7/7 >> Metronidazole  7/7 >>  Dose adjustments this admission: N/A  Microbiology results: 7/7 BCx: collected 7/7 MRSA PCR: ordered  Thank you for allowing pharmacy to be a part of this patient's care.  Will M. Lenon, PharmD Clinical Pharmacist 08/01/2023 10:25 PM

## 2023-08-01 NOTE — Progress Notes (Signed)
 CODE SEPSIS - PHARMACY COMMUNICATION  **Broad Spectrum Antibiotics should be administered within 1 hour of Sepsis diagnosis**  Time Code Sepsis Called/Page Received: 16:06  Antibiotics Ordered: Ceftriaxone    Time of 1st antibiotic administration: 16:35  Additional action taken by pharmacy: n/a  If necessary, Name of Provider/Nurse Contacted: n/a    Shari Rivera ,PharmD Clinical Pharmacist  08/01/2023  4:45 PM

## 2023-08-01 NOTE — Assessment & Plan Note (Signed)
 Check serum magnesium  level Status post potassium chloride  10 mEq IV, 2 doses, per EDP Continue with LR infusion at 150 mL/h, 1 day ordered Potassium chloride  10 mEq, 2 doses ordered on admission

## 2023-08-01 NOTE — Assessment & Plan Note (Signed)
 Blood cultures x 2 are in process Check C. difficile screening and GI panel Continue with broad-spectrum antibiotic including metronidazole , vancomycin , and cefepime  Status post sepsis bolus per EDP On admission I have continued with LR infusion at 150 mL/h, 1 day ordered We will recheck a third lactic acid to ensure downtrending

## 2023-08-01 NOTE — ED Notes (Signed)
 Pt reporting to ED d/t emesis, abdominal pain, and diarrhea. Pt also reporting CP initially. At this time, pt is resting in ED stretcher, receiving IV potassium dose. Pt ABCs intact. RR even and unlabored. Pt in NAD. Bed in lowest locked position. Call bell in reach. Denies needs at this time.   Past Medical History:  Diagnosis Date   Allergy    Anemia    Anxiety    Arthritis    Cancer (HCC)    Carpal tunnel syndrome, bilateral    Cataract    COVID-19    Depression    Dyspnea    former smoker   Endometriosis    Family history of adverse reaction to anesthesia    Mother difficult to wake up   GERD (gastroesophageal reflux disease)    Pneumonia    Pre-diabetes

## 2023-08-01 NOTE — Assessment & Plan Note (Addendum)
 With anxiety Home sertraline  150 mg daily resumed Home clonazepam  0.5 mg nightly as needed for anxiety resumed

## 2023-08-01 NOTE — Sepsis Progress Note (Signed)
 Sepsis protocol is being followed by [facility].

## 2023-08-01 NOTE — Assessment & Plan Note (Signed)
Home rosuvastatin 20 mg daily resumed

## 2023-08-01 NOTE — ED Triage Notes (Signed)
 Pt to ED via POV with multiple medical complaints. Pt states that she has had abdominal pain and diarrhea since Wednesday. Pt has had shortness of breath since Wednesday as well. Pt reports chest pain x 4 days. Pt very worked up in triage. Stating that she can not breath and that it hurts her heart to breath. Pt was initially tachycardic in the 140's but once she calmed down her heart rate came down to 115. Pts O2 sat 100%

## 2023-08-01 NOTE — Hospital Course (Addendum)
 [name] is a 68 year old female with history of GERD, hyperlipidemia, depression, who presents emergency department for chief concerns of abdominal pain, diarrhea, shortness of breath.  Vitals in the ED showed temperature of 97.6, respiration rate of 24 initially, heart rate initially 115 and improved to 91, blood pressure 90/63, improved to 128/38, SpO2 100% on room air.  Serum sodium is 137, potassium 3.1, chloride 105, bicarb 20, BUN of 11, serum creatinine of 0.99, EGFR greater than 60, nonfasting blood glucose 125, WBC 14.8, hemoglobin 12.6, platelets of 241.  Lactic acid is 2.8, repeat is 3.2.  HS troponin is 4 and on repeat is 6. UA was negative for leukocytes and nitrates.  Blood cultures x 2 are in process.  COVID/influenza A/influenza B/RSV PCR have been collected and pending result.    3rd lactic acid has been ordered and pending result.  ED treatment: Ceftriaxone  2 g IV one-time dose, metronidazole  500 mg IV one-time dose, potassium chloride  10 mEq x 2

## 2023-08-01 NOTE — Assessment & Plan Note (Signed)
Home famotidine 40 mg nightly resumed

## 2023-08-01 NOTE — ED Provider Notes (Signed)
 [facility] Provider Note    Event Date/Time   First MD Initiated Contact with Patient 08/01/23 1548     (approximate)   History   Emesis, Chest Pain, and Near Syncope   HPI  Shari Rivera is a 68 year old female presenting to the emergency department for evaluation of shortness of breath and abdominal pain.  Reports symptoms started last Tuesday.  Reports cough and congestion with associated shortness of breath as well as chest pain worse when she takes a deep breath.  She additionally states that she has had generalized abdominal pain with multiple episodes of nonbloody vomiting and diarrhea.  Reports poor p.o. intake.  Tachycardic up to the 140s initially in triage.     Physical Exam   Triage Vital Signs: ED Triage Vitals  Encounter Vitals Group     BP 08/01/23 1343 90/63     Girls Systolic BP Percentile --      Girls Diastolic BP Percentile --      Boys Systolic BP Percentile --      Boys Diastolic BP Percentile --      Pulse Rate 08/01/23 1343 (!) 115     Resp 08/01/23 1343 (!) 24     Temp 08/01/23 1458 97.6 F (36.4 C)     Temp Source 08/01/23 1458 Oral     SpO2 08/01/23 1343 100 %     Weight 08/01/23 1344 116 lb 13.5 oz (53 kg)     Height 08/01/23 1344 5' 3 (1.6 m)     Head Circumference --      Peak Flow --      Pain Score 08/01/23 1343 8     Pain Loc --      Pain Education --      Exclude from Growth Chart --     Most recent vital signs: Vitals:   08/01/23 2100 08/01/23 2120  BP:    Pulse: 93 91  Resp:  13  Temp:    SpO2: 100% 100%     General: Awake, interactive  CV:  Tachycardic with regular rhythm, normal peripheral perfusion Resp:  Unlabored respirations, lungs clear to auscultation Abd:  Nondistended, soft generalized tenderness to palpation without rebound or guarding Neuro:  Symmetric facial movement, fluid speech   ED Results / Procedures / Treatments   Labs (all labs ordered are listed, but only abnormal  results are displayed) Labs Reviewed  LACTIC ACID, PLASMA - Abnormal; Notable for the following components:      Result Value   Lactic Acid, Venous 2.8 (*)    All other components within normal limits  LACTIC ACID, PLASMA - Abnormal; Notable for the following components:   Lactic Acid, Venous 3.2 (*)    All other components within normal limits  COMPREHENSIVE METABOLIC PANEL WITH GFR - Abnormal; Notable for the following components:   Potassium 3.1 (*)    CO2 20 (*)    Glucose, Bld 125 (*)    All other components within normal limits  CBC WITH DIFFERENTIAL/PLATELET - Abnormal; Notable for the following components:   WBC 14.8 (*)    Neutro Abs 11.9 (*)    Monocytes Absolute 1.3 (*)    Abs Immature Granulocytes 0.09 (*)    All other components within normal limits  URINALYSIS, W/ REFLEX TO CULTURE (INFECTION SUSPECTED) - Abnormal; Notable for the following components:   Color, Urine STRAW (*)    APPearance CLEAR (*)    All other components within normal limits  RESP PANEL BY RT-PCR (RSV, FLU A&B, COVID)  [id number]  CULTURE, BLOOD (ROUTINE X 2)  CULTURE, BLOOD (ROUTINE X 2)  LACTIC ACID, PLASMA  TROPONIN I (HIGH SENSITIVITY)  TROPONIN I (HIGH SENSITIVITY)     EKG EKG independently reviewed and interpreted by myself demonstrates:  EKG demonstrate sinus tachycardia at a rate of 111, PR 116, QRS 64, QTc 456, nonspecific ST changes, no STEMI  RADIOLOGY Imaging independently reviewed and interpreted by myself demonstrates:  CXR without focal consolidation CTA of the chest without PE CT abdomen pelvis without colitis or other acute findings  Formal Radiology Read:  CT ABDOMEN PELVIS W CONTRAST Result Date: 08/01/2023 CLINICAL DATA:  Sepsis.  Abdominal pain EXAM: CT ABDOMEN AND PELVIS WITH CONTRAST TECHNIQUE: Multidetector CT imaging of the abdomen and pelvis was performed using the standard protocol following bolus administration of intravenous contrast. RADIATION DOSE REDUCTION: This  exam was performed according to the departmental dose-optimization program which includes automated exposure control, adjustment of the mA and/or kV according to patient size and/or use of iterative reconstruction technique. CONTRAST:  75mL OMNIPAQUE  IOHEXOL  350 MG/ML SOLN COMPARISON:  None Available. FINDINGS: Lower chest: No acute findings Hepatobiliary: No focal hepatic abnormality. Gallbladder unremarkable. Pancreas: No focal abnormality or ductal dilatation. Spleen: No focal abnormality.  Normal size. Adrenals/Urinary Tract: No adrenal abnormality. No focal renal abnormality. No stones or hydronephrosis. Urinary bladder is unremarkable. Stomach/Bowel: Stomach, large and small bowel grossly unremarkable. Vascular/Lymphatic: Aortic atherosclerosis. No evidence of aneurysm or adenopathy. Reproductive: Uterus and adnexa unremarkable.  No mass. Other: No free fluid or free air. Musculoskeletal: No acute bony abnormality. IMPRESSION: No acute findings in the abdomen or pelvis. Aortic atherosclerosis. Electronically Signed   By: Franky Crease M.D.   On: 08/01/2023 19:19   CT Angio Chest PE W and/or Wo Contrast Result Date: 08/01/2023 CLINICAL DATA:  Pulmonary embolism (PE) suspected, high prob. Abdominal pain. Shortness of breath, chest pain. EXAM: CT ANGIOGRAPHY CHEST WITH CONTRAST TECHNIQUE: Multidetector CT imaging of the chest was performed using the standard protocol during bolus administration of intravenous contrast. Multiplanar CT image reconstructions and MIPs were obtained to evaluate the vascular anatomy. RADIATION DOSE REDUCTION: This exam was performed according to the departmental dose-optimization program which includes automated exposure control, adjustment of the mA and/or kV according to patient size and/or use of iterative reconstruction technique. CONTRAST:  75mL OMNIPAQUE  IOHEXOL  350 MG/ML SOLN COMPARISON:  None Available. FINDINGS: Cardiovascular: No filling defects in the pulmonary arteries to  suggest pulmonary emboli. Heart is normal size. Aorta is normal caliber. Mediastinum/Nodes: No mediastinal, hilar, or axillary adenopathy. Trachea and esophagus are unremarkable. Thyroid  unremarkable. Lungs/Pleura: No confluent airspace opacities or effusions. Minimal dependent atelectasis. Upper Abdomen: No acute findings Musculoskeletal: Chest wall soft tissues are unremarkable. No acute bony abnormality. Review of the MIP images confirms the above findings. IMPRESSION: No evidence of pulmonary embolus. Minimal dependent atelectasis in the lower lobes. No acute cardiopulmonary disease. Electronically Signed   By: Franky Crease M.D.   On: 08/01/2023 19:17   DG Chest 2 View Result Date: 08/01/2023 CLINICAL DATA:  Chest pain. EXAM: CHEST - 2 VIEW COMPARISON:  08/30/2008. FINDINGS: The heart size and mediastinal contours are within normal limits. No focal consolidation, pleural effusion, or pneumothorax. No acute osseous abnormality. IMPRESSION: No acute cardiopulmonary findings. Electronically Signed   By: Harrietta Sherry M.D.   On: 08/01/2023 14:06    PROCEDURES:  Critical Care performed: Yes, see critical care procedure note(s)  CRITICAL CARE Performed by: Nilsa Dade  Total critical care time: 31 minutes  Critical care time was exclusive of separately billable procedures and treating other patients.  Critical care was necessary to treat or prevent imminent or life-threatening deterioration.  Critical care was time spent personally by me on the following activities: development of treatment plan with patient and/or surrogate as well as nursing, discussions with consultants, evaluation of patient's response to treatment, examination of patient, obtaining history from patient or surrogate, ordering and performing treatments and interventions, ordering and review of laboratory studies, ordering and review of radiographic studies, pulse oximetry and re-evaluation of patient's  condition.   Procedures   MEDICATIONS ORDERED IN ED: Medications  promethazine  (PHENERGAN ) 12.5 mg in sodium chloride  0.9 % 50 mL IVPB (has no administration in time range)  sodium chloride  0.9 % bolus 1,000 mL (0 mLs Intravenous Stopped 08/01/23 2016)  cefTRIAXone  (ROCEPHIN ) 2 g in sodium chloride  0.9 % 100 mL IVPB (0 g Intravenous Stopped 08/01/23 1742)  metroNIDAZOLE  (FLAGYL ) IVPB 500 mg (500 mg Intravenous New Bag/Given 08/01/23 2016)  potassium chloride  10 mEq in 100 mL IVPB (0 mEq Intravenous Stopped 08/01/23 2016)  sodium chloride  0.9 % bolus 1,000 mL (1,000 mLs Intravenous New Bag/Given 08/01/23 2114)  iohexol  (OMNIPAQUE ) 350 MG/ML injection 75 mL (75 mLs Intravenous Contrast Given 08/01/23 1756)     IMPRESSION / MDM / ASSESSMENT AND PLAN / ED COURSE  I reviewed the triage vital signs and the nursing notes.  Differential diagnosis includes, but is not limited to, pneumonia, colitis, appendicitis, other acute intra-abdominal process, dehydration, anemia, electrolyte abnormality  Patient's presentation is most consistent with acute presentation with potential threat to life or bodily function.  68 year old female presenting to the ER for evaluation of shortness of breath, abdominal pain.  Found to be tachycardic, tachypneic on presentation.  Labs from triage notable for leukocytosis with WC of 14.8.  Mild hypokalemia noted with K of 3.1.  Elevated lactate at 2.8.  Given concern for infection, sepsis orders initiated with empiric Rocephin  and Flagyl .  Ordered for IV fluids.  Lactate unfortunately up trended to 3.2.  Chest x-Judythe Postema without focal consolidation.  CTA without PE or pneumonia.  CT abdomen pelvis without acute findings.  Urinalysis pending.  Given uptrending lactate and concern for infection, suspect patient will be appropriate for admission.  2134 Urinalysis resulted without evidence of infection.  Patient reassessed.  Initial improvement with medication, but recurrent pain and vomiting  despite medications.  Will trial Phenergan .  No obvious infectious source, question possible viral illness.  However, with uptrending lactate, ability to tolerate p.o., do think admission is reasonable.  Will reach out to hospitalist team. Clinical Course as of 08/01/23 2151  [facility] Aug 01, 2023  2149 Case discussed with hospitalist team.  They will evaluate for anticipated admission. [NR]    Clinical Course User Index [NR] Levander Slate, MD     FINAL CLINICAL IMPRESSION(S) / ED DIAGNOSES   Final diagnoses:  Nausea vomiting and diarrhea  Generalized abdominal pain  Lactic acidosis     Rx / DC Orders   ED Discharge Orders     None        Note:  This document was prepared using Dragon voice recognition software and may include unintentional dictation errors.   Levander Slate, MD 08/01/23 2151

## 2023-08-01 NOTE — ED Notes (Signed)
 Pt temporarily disconnected to use restroom and provide urine sample. Pt ABCs intact. RR even and unlabored. Pt in NAD. Bed in lowest locked position. Call bell in reach. Denies needs at this time.

## 2023-08-01 NOTE — H&P (Addendum)
 History and Physical   Shari Rivera FMW:[id number] DOB: 04-14-1955 DOA: 08/01/2023  PCP: Sharma Coyer, MD  Patient coming from: Home  I have personally briefly reviewed patient's old medical records in [facility] EMR.  Chief Concern: Abdominal pain, diarrhea, shortness of breath  HPI: Shari Rivera is a 68 year old female with history of GERD, hyperlipidemia, depression, who presents emergency department for chief concerns of abdominal pain, diarrhea, shortness of breath.  Vitals in the ED showed temperature of 97.6, respiration rate of 24 initially, heart rate initially 115 and improved to 91, blood pressure 90/63, improved to 128/38, SpO2 100% on room air.  Serum sodium is 137, potassium 3.1, chloride 105, bicarb 20, BUN of 11, serum creatinine of 0.99, EGFR greater than 60, nonfasting blood glucose 125, WBC 14.8, hemoglobin 12.6, platelets of 241.  Lactic acid is 2.8, repeat is 3.2.  HS troponin is 4 and on repeat is 6. UA was negative for leukocytes and nitrates.  Blood cultures x 2 are in process.  COVID/influenza A/influenza B/RSV PCR have been collected and pending result.    3rd lactic acid has been ordered and pending result.  ED treatment: Ceftriaxone  2 g IV one-time dose, metronidazole  500 mg IV one-time dose, potassium chloride  10 mEq x 2 ------------------------------------ At bedside, patient able to tell me her first and last name, age, location, current calendar year.  She reports that she has been experiencing nausea, vomiting, diarrhea since Wednesday.  She reports her stool has been watery and brown in color.  Her vomitus has been everything she tries to drink including Sprite, water, ginger ale.  She has not had a good meal since Wednesday.  He denies trauma to person.  She denies any changes to her diet, recent antibiotic use, recent medication changes.  She denies being on a cruise.  She denies fever, chills, chest pain, dysuria,  hematuria.  Her nephew who currently lives with her has recently been experiencing nausea, vomiting, diarrhea as well.  Social history: She has her own home and currently a nephew lives with her.  She denies tobacco, EtOH, recreational drug use.  She is retired and previously worked Education officer, environmental [employer].  ROS: Constitutional: no weight change, no fever ENT/Mouth: no sore throat, no rhinorrhea Eyes: no eye pain, no vision changes Cardiovascular: no chest pain, no dyspnea,  no edema, no palpitations Respiratory: no cough, no sputum, no wheezing Gastrointestinal: + nausea, + vomiting, + diarrhea, no constipation Genitourinary: no urinary incontinence, no dysuria, no hematuria Musculoskeletal: no arthralgias, no myalgias Skin: no skin lesions, no pruritus, Neuro: + weakness, no loss of consciousness, no syncope Psych: no anxiety, no depression, + decrease appetite Heme/Lymph: no bruising, no bleeding  ED Course: Discussed with EDP, patient requiring hospitalization for chief concerns of meeting sepsis criteria intractable nausea and vomiting.  Assessment/Plan  Principal Problem:   Sepsis (HCC) Active Problems:   Esophageal dysphagia   Recurrent major depression (HCC)   Hyperlipidemia   GERD (gastroesophageal reflux disease)   Hypokalemia   Intractable nausea and vomiting   Assessment and Plan:  * Sepsis (HCC) Blood cultures x 2 are in process Check C. difficile screening and GI panel Continue with broad-spectrum antibiotic including metronidazole , vancomycin , and cefepime  Status post sepsis bolus per EDP On admission I have continued with LR infusion at 150 mL/h, 1 day ordered We will recheck a third lactic acid to ensure downtrending  Intractable nausea and vomiting Ondansetron  4 mg p.o./IV every 6 hours as needed for nausea, vomiting; Phenergan  12.5  mg IV every 6 hours.  For refractory nausea and vomiting, 1 day ordered  Hypokalemia Check serum magnesium  level Status post  potassium chloride  10 mEq IV, 2 doses, per EDP Continue with LR infusion at 150 mL/h, 1 day ordered Potassium chloride  10 mEq, 2 doses ordered on admission  GERD (gastroesophageal reflux disease) Home famotidine  40 mg nightly resumed  Hyperlipidemia Home rosuvastatin  20 mg daily resumed  Recurrent major depression (HCC) With anxiety Home sertraline  150 mg daily resumed Home clonazepam  0.5 mg nightly as needed for anxiety resumed  Chart reviewed.   DVT prophylaxis: Heparin  5000 units subcutaneous every 8 hours Code Status: Full code Diet: Clear liquid diet; with orders to advance to full liquid diet as tolerated Family Communication: No Disposition Plan: Pending clinical course Consults called: Pharmacy Admission status: PCU, inpatient  Past Medical History:  Diagnosis Date   Allergy    Anemia    Anxiety    Arthritis    Cancer (HCC)    Carpal tunnel syndrome, bilateral    Cataract    COVID-19    Depression    Dyspnea    former smoker   Endometriosis    Family history of adverse reaction to anesthesia    Mother difficult to wake up   GERD (gastroesophageal reflux disease)    Pneumonia    Pre-diabetes    Past Surgical History:  Procedure Laterality Date   APPENDECTOMY     CARPAL TUNNEL RELEASE Left 11/30/2022   Procedure: LEFT CARPAL TUNNEL RELEASE WITH ULTRASOUND GUIDANCE;  Surgeon: Claudene Penne ORN, MD;  Location: [facility] ORS;  Service: Neurosurgery;  Laterality: Left;  LOCAL W/ MAC   COLONOSCOPY N/A 06/28/2023   Procedure: COLONOSCOPY;  Surgeon: Jinny Carmine, MD;  Location: [facility] ENDOSCOPY;  Service: Endoscopy;  Laterality: N/A;   DIAGNOSTIC LAPAROSCOPY     endometrosis   EYE SURGERY     cataract bilateral with lens replaced   NASAL SEPTUM SURGERY     SMALL INTESTINE SURGERY     Social History:  reports that she has quit smoking. She has never used smokeless tobacco. She reports current drug use. Drug: Marijuana. She reports that she does not drink  alcohol.  Allergies  Allergen Reactions   Codeine Diarrhea and Nausea And Vomiting   Family History  Problem Relation Age of Onset   Cancer Mother    Stroke Mother    COPD Mother    Heart failure Mother    Atrial fibrillation Mother    Heart disease Father    Diabetes Sister    Diabetes Brother    Cancer Maternal Grandmother    Diabetes Maternal Grandmother    Kidney disease Maternal Grandfather    Heart disease Maternal Grandfather    Heart disease Paternal Grandfather    Breast cancer Other        mggm   Family history: Family history reviewed and not pertinent.  Prior to Admission medications   Medication Sig Start Date End Date Taking? Authorizing Provider  alendronate  (FOSAMAX ) 70 MG tablet TAKE 1 TABLET EVERY 7 DAYS WITH A FULL GLASS OF WATER ON AN EMPTY STOMACH DO NOT LIE DOWN FOR AT LEAST 30 MIN 11/12/22   Simmons-Robinson, Makiera, MD  Calcium  Carbonate-Vitamin D  600-400 MG-UNIT tablet Take 1 tablet by mouth daily.    [provider]  clonazePAM  (KLONOPIN ) 0.5 MG tablet Take 1 tablet (0.5 mg total) by mouth at bedtime as needed for anxiety. 05/09/23   Simmons-Robinson, Rockie, MD  Coenzyme Q10 (COQ-10) 200  MG CAPS Take 200 mg by mouth at bedtime. 05/09/23   Simmons-Robinson, Makiera, MD  famotidine  (PEPCID ) 40 MG tablet TAKE ONE TABLET BY MOUTH AT BEDTIME 09/28/22   Simmons-Robinson, Makiera, MD  fluticasone  (FLONASE ) 50 MCG/ACT nasal spray Place 2 sprays into both nostrils daily. 06/10/22   Simmons-Robinson, Rockie, MD  Multiple Vitamin (MULTIVITAMIN WITH MINERALS) TABS tablet Take 1 tablet by mouth daily.    [provider]  Naproxen Sod-diphenhydrAMINE (ALEVE PM) 220-25 MG TABS Take 2 capsules by mouth at bedtime.    [provider]  naproxen sodium (ALEVE) 220 MG tablet Take 220 mg by mouth daily as needed (pain).    [provider]  rosuvastatin  (CRESTOR ) 20 MG tablet Take 1 tablet (20 mg total) by mouth daily. 05/09/23    Simmons-Robinson, Rockie, MD  sertraline  (ZOLOFT ) 100 MG tablet Take 1.5 tablets (150 mg total) by mouth daily. 05/09/23 11/05/23  Simmons-Robinson, Rockie, MD  terbinafine  (LAMISIL ) 250 MG tablet Take 1 tablet (250 mg total) by mouth daily. Patient not taking: Reported on 06/28/2023 05/24/23   Tobie Franky SQUIBB, DPM   Physical Exam: Vitals:   08/01/23 1900 08/01/23 1930 08/01/23 2100 08/01/23 2120  BP: 128/65 (!) 128/38    Pulse:  (!) 101 93 91  Resp: (!) 23 (!) 22  13  Temp:      TempSrc:      SpO2:  97% 100% 100%  Weight:      Height:       Constitutional: appears age-appropriate, NAD, calm Eyes: PERRL, lids and conjunctivae normal ENMT: Mucous membranes are moist. Posterior pharynx clear of any exudate or lesions. Age-appropriate dentition. Hearing appropriate Neck: normal, supple, no masses, no thyromegaly Respiratory: clear to auscultation bilaterally, no wheezing, no crackles. Normal respiratory effort. No accessory muscle use.  Cardiovascular: Regular rate and rhythm, no murmurs / rubs / gallops. No extremity edema. 2+ pedal pulses. No carotid bruits.  Abdomen: no tenderness, no masses palpated, no hepatosplenomegaly. Bowel sounds positive.  Musculoskeletal: no clubbing / cyanosis. No joint deformity upper and lower extremities. Good ROM, no contractures, no atrophy. Normal muscle tone.  Skin: no rashes, lesions, ulcers. No induration Neurologic: Sensation intact. Strength 5/5 in all 4.  Psychiatric: Normal judgment and insight. Alert and oriented x 3. Normal mood.   EKG: independently reviewed, showing sinus tachycardia with rate of 111, QTc 456  Chest x-ray on Admission: I personally reviewed and I agree with radiologist reading as below.  CT ABDOMEN PELVIS W CONTRAST Result Date: 08/01/2023 CLINICAL DATA:  Sepsis.  Abdominal pain EXAM: CT ABDOMEN AND PELVIS WITH CONTRAST TECHNIQUE: Multidetector CT imaging of the abdomen and pelvis was performed using the standard protocol  following bolus administration of intravenous contrast. RADIATION DOSE REDUCTION: This exam was performed according to the departmental dose-optimization program which includes automated exposure control, adjustment of the mA and/or kV according to patient size and/or use of iterative reconstruction technique. CONTRAST:  75mL OMNIPAQUE  IOHEXOL  350 MG/ML SOLN COMPARISON:  None Available. FINDINGS: Lower chest: No acute findings Hepatobiliary: No focal hepatic abnormality. Gallbladder unremarkable. Pancreas: No focal abnormality or ductal dilatation. Spleen: No focal abnormality.  Normal size. Adrenals/Urinary Tract: No adrenal abnormality. No focal renal abnormality. No stones or hydronephrosis. Urinary bladder is unremarkable. Stomach/Bowel: Stomach, large and small bowel grossly unremarkable. Vascular/Lymphatic: Aortic atherosclerosis. No evidence of aneurysm or adenopathy. Reproductive: Uterus and adnexa unremarkable.  No mass. Other: No free fluid or free air. Musculoskeletal: No acute bony abnormality. IMPRESSION: No acute findings in the  abdomen or pelvis. Aortic atherosclerosis. Electronically Signed   By: Franky Crease M.D.   On: 08/01/2023 19:19   CT Angio Chest PE W and/or Wo Contrast Result Date: 08/01/2023 CLINICAL DATA:  Pulmonary embolism (PE) suspected, high prob. Abdominal pain. Shortness of breath, chest pain. EXAM: CT ANGIOGRAPHY CHEST WITH CONTRAST TECHNIQUE: Multidetector CT imaging of the chest was performed using the standard protocol during bolus administration of intravenous contrast. Multiplanar CT image reconstructions and MIPs were obtained to evaluate the vascular anatomy. RADIATION DOSE REDUCTION: This exam was performed according to the departmental dose-optimization program which includes automated exposure control, adjustment of the mA and/or kV according to patient size and/or use of iterative reconstruction technique. CONTRAST:  75mL OMNIPAQUE  IOHEXOL  350 MG/ML SOLN COMPARISON:   None Available. FINDINGS: Cardiovascular: No filling defects in the pulmonary arteries to suggest pulmonary emboli. Heart is normal size. Aorta is normal caliber. Mediastinum/Nodes: No mediastinal, hilar, or axillary adenopathy. Trachea and esophagus are unremarkable. Thyroid  unremarkable. Lungs/Pleura: No confluent airspace opacities or effusions. Minimal dependent atelectasis. Upper Abdomen: No acute findings Musculoskeletal: Chest wall soft tissues are unremarkable. No acute bony abnormality. Review of the MIP images confirms the above findings. IMPRESSION: No evidence of pulmonary embolus. Minimal dependent atelectasis in the lower lobes. No acute cardiopulmonary disease. Electronically Signed   By: Franky Crease M.D.   On: 08/01/2023 19:17   DG Chest 2 View Result Date: 08/01/2023 CLINICAL DATA:  Chest pain. EXAM: CHEST - 2 VIEW COMPARISON:  08/30/2008. FINDINGS: The heart size and mediastinal contours are within normal limits. No focal consolidation, pleural effusion, or pneumothorax. No acute osseous abnormality. IMPRESSION: No acute cardiopulmonary findings. Electronically Signed   By: Harrietta Sherry M.D.   On: 08/01/2023 14:06   Labs on Admission: I have personally reviewed following labs  CBC: Recent Labs  Lab 08/01/23 1347  WBC 14.8*  NEUTROABS 11.9*  HGB 12.6  HCT 36.6  MCV 92.2  PLT 241   Basic Metabolic Panel: Recent Labs  Lab 08/01/23 1347  NA 137  K 3.1*  CL 105  CO2 20*  GLUCOSE 125*  BUN 11  CREATININE 0.99  CALCIUM  9.6   GFR: Estimated Creatinine Clearance: 45 mL/min (by C-G formula based on SCr of 0.99 mg/dL).  Liver Function Tests: Recent Labs  Lab 08/01/23 1347  AST 22  ALT 18  ALKPHOS 75  BILITOT 0.5  PROT 7.0  ALBUMIN 3.6   Urine analysis:    Component Value Date/Time   COLORURINE STRAW (A) 08/01/2023 2117   APPEARANCEUR CLEAR (A) 08/01/2023 2117   LABSPEC 1.023 08/01/2023 2117   PHURINE 6.0 08/01/2023 2117   GLUCOSEU NEGATIVE 08/01/2023  2117   HGBUR NEGATIVE 08/01/2023 2117   BILIRUBINUR NEGATIVE 08/01/2023 2117   KETONESUR NEGATIVE 08/01/2023 2117   PROTEINUR NEGATIVE 08/01/2023 2117   NITRITE NEGATIVE 08/01/2023 2117   LEUKOCYTESUR NEGATIVE 08/01/2023 2117   This document was prepared using Dragon Voice Recognition software and may include unintentional dictation errors.  Dr. Sherre [facility]  If 7PM-7AM, please contact overnight-coverage provider If 7AM-7PM, please contact day attending provider [email or web address]  08/01/2023, 11:05 PM

## 2023-08-01 NOTE — Assessment & Plan Note (Signed)
 Ondansetron  4 mg p.o./IV every 6 hours as needed for nausea, vomiting; Phenergan  12.5 mg IV every 6 hours.  For refractory nausea and vomiting, 1 day ordered

## 2023-08-02 DIAGNOSIS — A419 Sepsis, unspecified organism: Secondary | ICD-10-CM | POA: Diagnosis not present

## 2023-08-02 LAB — BLOOD CULTURE ID PANEL (REFLEXED) - BCID2

## 2023-08-02 LAB — CBC
HCT: 30.1 % — ABNORMAL LOW (ref 36.0–46.0)
Hemoglobin: 10 g/dL — ABNORMAL LOW (ref 12.0–15.0)
MCH: 31.3 pg (ref 26.0–34.0)
MCHC: 33.2 g/dL (ref 30.0–36.0)
MCV: 94.4 fL (ref 80.0–100.0)
Platelets: 173 K/uL (ref 150–400)
RBC: 3.19 MIL/uL — ABNORMAL LOW (ref 3.87–5.11)
RDW: 12.8 % (ref 11.5–15.5)
WBC: 12.8 K/uL — ABNORMAL HIGH (ref 4.0–10.5)
nRBC: 0 % (ref 0.0–0.2)

## 2023-08-02 LAB — MAGNESIUM
Magnesium: 1.9 mg/dL (ref 1.7–2.4)
Magnesium: 1.9 mg/dL (ref 1.7–2.4)

## 2023-08-02 LAB — BASIC METABOLIC PANEL WITH GFR
Anion gap: 7 (ref 5–15)
Anion gap: 12 (ref 5–15)
BUN: 9 mg/dL (ref 8–23)
BUN: 8 mg/dL (ref 8–23)
CO2: 18 mmol/L — ABNORMAL LOW (ref 22–32)
CO2: 20 mmol/L — ABNORMAL LOW (ref 22–32)
Calcium: 8.2 mg/dL — ABNORMAL LOW (ref 8.9–10.3)
Calcium: 8.9 mg/dL (ref 8.9–10.3)
Chloride: 115 mmol/L — ABNORMAL HIGH (ref 98–111)
Chloride: 109 mmol/L (ref 98–111)
Creatinine, Ser: 0.84 mg/dL (ref 0.44–1.00)
Creatinine, Ser: 0.82 mg/dL (ref 0.44–1.00)
GFR, Estimated: >60 mL/min (ref >60)
GFR, Estimated: >60 mL/min (ref >60)
Glucose, Bld: 109 mg/dL — ABNORMAL HIGH (ref 70–99)
Glucose, Bld: 120 mg/dL — ABNORMAL HIGH (ref 70–99)
Potassium: 3.6 mmol/L (ref 3.5–5.1)
Potassium: 3.8 mmol/L (ref 3.5–5.1)
Sodium: 140 mmol/L (ref 135–145)
Sodium: 141 mmol/L (ref 135–145)

## 2023-08-02 LAB — IRON AND TIBC
Iron: 14 ug/dL — ABNORMAL LOW (ref 28–170)
Saturation Ratios: 9 % — ABNORMAL LOW (ref 10.4–31.8)
TIBC: 165 ug/dL — ABNORMAL LOW (ref 250–450)
UIBC: 151 ug/dL

## 2023-08-02 LAB — URINE DRUG SCREEN, QUALITATIVE (ARMC ONLY)
Amphetamines, Ur Screen: NOT DETECTED
Barbiturates, Ur Screen: NOT DETECTED
Benzodiazepine, Ur Scrn: NOT DETECTED
Cannabinoid 50 Ng, Ur ~~LOC~~: POSITIVE — AB
Cocaine Metabolite,Ur ~~LOC~~: NOT DETECTED
MDMA (Ecstasy)Ur Screen: NOT DETECTED
Methadone Scn, Ur: NOT DETECTED
Opiate, Ur Screen: NOT DETECTED
Phencyclidine (PCP) Ur S: NOT DETECTED
Tricyclic, Ur Screen: NOT DETECTED

## 2023-08-02 LAB — PHOSPHORUS
Phosphorus: 1.7 mg/dL — ABNORMAL LOW (ref 2.5–4.6)
Phosphorus: 1.2 mg/dL — ABNORMAL LOW (ref 2.5–4.6)

## 2023-08-02 LAB — FOLATE: Folate: 25 ng/mL (ref >5.9)

## 2023-08-02 LAB — CORTISOL-AM, BLOOD: Cortisol - AM: 17.5 ug/dL (ref 6.7–22.6)

## 2023-08-02 LAB — VITAMIN B12: Vitamin B-12: 463 pg/mL (ref 180–914)

## 2023-08-02 LAB — VITAMIN D 25 HYDROXY (VIT D DEFICIENCY, FRACTURES): Vit D, 25-Hydroxy: 34.99 ng/mL (ref 30–100)

## 2023-08-02 LAB — PROTIME-INR
INR: 1.5 — ABNORMAL HIGH (ref 0.8–1.2)
Prothrombin Time: 18.7 s — ABNORMAL HIGH (ref 11.4–15.2)

## 2023-08-02 MED ORDER — SODIUM BICARBONATE 8.4 % IV SOLN
100.0000 meq | Freq: Once | INTRAVENOUS | Status: AC
  Administered 2023-08-02: 100 meq via INTRAVENOUS
  Filled 2023-08-02: qty 50

## 2023-08-02 MED ORDER — VANCOMYCIN HCL IN DEXTROSE 1-5 GM/200ML-% IV SOLN: 1000.0000 mg | INTRAVENOUS | Status: DC

## 2023-08-02 MED ORDER — LACTATED RINGERS IV SOLN: INTRAVENOUS | Status: AC

## 2023-08-02 MED ORDER — SODIUM BICARBONATE 8.4 % IV SOLN
50.0000 meq | Freq: Once | INTRAVENOUS | Status: AC
  Administered 2023-08-02: 50 meq via INTRAVENOUS
  Filled 2023-08-02: qty 50

## 2023-08-02 MED ORDER — VITAMIN C 500 MG PO TABS
500.0000 mg | ORAL_TABLET | Freq: Every day | ORAL | Status: DC
  Administered 2023-08-02 – 2023-08-04 (×3): 500 mg via ORAL
  Filled 2023-08-02 (×3): qty 1

## 2023-08-02 MED ORDER — SODIUM CHLORIDE 0.9 % IV SOLN
2.0000 g | INTRAVENOUS | Status: DC
  Administered 2023-08-02 – 2023-08-03 (×2): 2 g via INTRAVENOUS
  Filled 2023-08-02 (×3): qty 20

## 2023-08-02 MED ORDER — POTASSIUM PHOSPHATES 15 MMOLE/5ML IV SOLN
30.0000 mmol | Freq: Once | INTRAVENOUS | Status: AC
  Administered 2023-08-02: 30 mmol via INTRAVENOUS
  Filled 2023-08-02: qty 10

## 2023-08-02 MED ORDER — PANTOPRAZOLE SODIUM 40 MG PO TBEC
40.0000 mg | DELAYED_RELEASE_TABLET | Freq: Two times a day (BID) | ORAL | Status: DC
  Administered 2023-08-02 – 2023-08-04 (×5): 40 mg via ORAL
  Filled 2023-08-02 (×5): qty 1

## 2023-08-02 MED ORDER — POLYSACCHARIDE IRON COMPLEX 150 MG PO CAPS
150.0000 mg | ORAL_CAPSULE | Freq: Every day | ORAL | Status: DC
  Administered 2023-08-02 – 2023-08-04 (×3): 150 mg via ORAL
  Filled 2023-08-02 (×4): qty 1

## 2023-08-02 MED ORDER — SACCHAROMYCES BOULARDII 250 MG PO CAPS
250.0000 mg | ORAL_CAPSULE | Freq: Two times a day (BID) | ORAL | Status: DC
  Administered 2023-08-02 – 2023-08-04 (×4): 250 mg via ORAL
  Filled 2023-08-02 (×6): qty 1

## 2023-08-02 NOTE — Consult Note (Signed)
 Pharmacy Antibiotic Note  ASSESSMENT: 68 y.o. female with PMH including esophageal dysphagia, urine leakage is presenting with sepsis from unknown source. Patient tachycardic with leukocytosis. Imaging thus far unremarkable. Pharmacy has been consulted to manage cefepime  and vancomycin  dosing. Patient also on metronidazole .  PLAN: Continue cefepime  2g IV q12H Change Vancomycin  to 1000 IV q24H  Goal AUC 400-550. Expected AUC: 541 Expected Cmin:12.5 Scr used: 0.84 Vd: 0.72 Follow up culture results to assess for antibiotic optimization. Monitor renal function to assess for any necessary antibiotic dosing changes.  Patient measurements: Height: 5' 3 (160 cm) Weight: 53 kg (116 lb 13.5 oz) IBW/kg (Calculated) : 52.4  Vital signs: Temp: 98.3 F (36.8 C) (07/08 1037) Temp Source: Oral (07/08 1037) BP: 117/70 (07/08 1049) Pulse Rate: 72 (07/08 1049) Recent Labs  Lab 08/01/23 1347 08/02/23 0606  WBC 14.8* 12.8*  CREATININE 0.99 0.84   Estimated Creatinine Clearance: 53 mL/min (by C-G formula based on SCr of 0.84 mg/dL).  Allergies: Allergies  Allergen Reactions   Codeine Diarrhea and Nausea And Vomiting   Antimicrobials this admission: Cefepime  7/7 >> Ceftriaxone  7/7 x 1 Vancomycin  7/7 >> Metronidazole  7/7 >>  Dose adjustments this admission:N/A  Microbiology results: 7/7 Bcx: negative < 24 hours 7/7 MRSA PCR: ordered 7/7 resp panel: negative  Thecla Forgione Rodriguez-Guzman PharmD, BCPS 08/02/2023 11:34 AM

## 2023-08-02 NOTE — Progress Notes (Signed)
 PHARMACY - PHYSICIAN COMMUNICATION CRITICAL VALUE ALERT - BLOOD CULTURE IDENTIFICATION (BCID)  Shari Rivera is an 68 y.o. female who presented to [facility] on 08/01/2023 with a chief complaint of abdominal pain, shortness of breath, N/V/D.   Assessment:  Blood cultures from 7/7 with GNR, BCID detects E coli, Source is not clear but possibly translocation with diarrhea.   Name of physician (or Provider) Contacted: Dr Von  Current antibiotics: Vancomycin , cefepime , metronidazole   Changes to prescribed antibiotics recommended:  Recommendations accepted by provider  Results for orders placed or performed during the hospital encounter of 08/01/23  Blood Culture ID Panel (Reflexed) (Collected: 08/01/2023  4:50 PM)  Result Value Ref Range   Enterococcus faecalis NOT DETECTED NOT DETECTED   Enterococcus Faecium NOT DETECTED NOT DETECTED   Listeria monocytogenes NOT DETECTED NOT DETECTED   Staphylococcus species NOT DETECTED NOT DETECTED   Staphylococcus aureus (BCID) NOT DETECTED NOT DETECTED   Staphylococcus epidermidis NOT DETECTED NOT DETECTED   Staphylococcus lugdunensis NOT DETECTED NOT DETECTED   Streptococcus species NOT DETECTED NOT DETECTED   Streptococcus agalactiae NOT DETECTED NOT DETECTED   Streptococcus pneumoniae NOT DETECTED NOT DETECTED   Streptococcus pyogenes NOT DETECTED NOT DETECTED   A.calcoaceticus-baumannii NOT DETECTED NOT DETECTED   Bacteroides fragilis NOT DETECTED NOT DETECTED   Enterobacterales DETECTED (A) NOT DETECTED   Enterobacter cloacae complex NOT DETECTED NOT DETECTED   Escherichia coli DETECTED (A) NOT DETECTED   Klebsiella aerogenes NOT DETECTED NOT DETECTED   Klebsiella oxytoca NOT DETECTED NOT DETECTED   Klebsiella pneumoniae NOT DETECTED NOT DETECTED   Proteus species NOT DETECTED NOT DETECTED   Salmonella species NOT DETECTED NOT DETECTED   Serratia marcescens NOT DETECTED NOT DETECTED   Haemophilus influenzae NOT DETECTED NOT DETECTED    Neisseria meningitidis NOT DETECTED NOT DETECTED   Pseudomonas aeruginosa NOT DETECTED NOT DETECTED   Stenotrophomonas maltophilia NOT DETECTED NOT DETECTED   Candida albicans NOT DETECTED NOT DETECTED   Candida auris NOT DETECTED NOT DETECTED   Candida glabrata NOT DETECTED NOT DETECTED   Candida krusei NOT DETECTED NOT DETECTED   Candida parapsilosis NOT DETECTED NOT DETECTED   Candida tropicalis NOT DETECTED NOT DETECTED   Cryptococcus neoformans/gattii NOT DETECTED NOT DETECTED   CTX-M ESBL NOT DETECTED NOT DETECTED   Carbapenem resistance IMP NOT DETECTED NOT DETECTED   Carbapenem resistance KPC NOT DETECTED NOT DETECTED   Carbapenem resistance NDM NOT DETECTED NOT DETECTED   Carbapenem resist OXA 48 LIKE NOT DETECTED NOT DETECTED   Carbapenem resistance VIM NOT DETECTED NOT DETECTED    Celestine Slovak, PharmD, BCPS, BCIDP Work Cell: [phone] 08/02/2023 2:48 PM

## 2023-08-02 NOTE — Progress Notes (Signed)
 [facility] Progress Note  Patient: Shari Rivera    FMW:[id number]  DOA: 08/01/2023     Date of Service: the patient was seen and examined on 08/02/2023  Chief Complaint  Patient presents with   Emesis   Chest Pain   Near Syncope   Brief hospital course: Shari Rivera is a 68 year old female with history of GERD, hyperlipidemia, depression, who presents emergency department for chief concerns of abdominal pain, diarrhea, shortness of breath for past one week.    ED w/up: Tmax 97.6, RR 24, HR 115--91, BP 90/63>> 128/38, SpO2 100% on room air.   BMP: Na 137, K 3.1, chl 105, bicarb 20, BUN 11, sCr 0.99, eGFR> 60, nonfasting BG 125, WBC 14.8, Hb 12.6, plts  of 241.  COVID/influenza A/influenza B/RSV PCR  Blood culture  TRH was consulted for admission and further management as below.  Assessment and Plan:  # Sepsis secondary to gastroenteritis, unknown source of infection Follow C. difficile and GI panel Continue empiric antibiotics vancomycin , cefepime  and Flagyl  Continue IV fluid for hydration Continue PPI Started probiotics Continue clear liquid diet, advance as per tolerance  # Metabolic acidosis secondary to acute gastroenteritis Bicarb depleted Follow repeat BMP  # Hypokalemia, potassium repleted. # Hypomagnesemia, mag repleted # Hypophosphatemia, Phos repleted. Monitor lites and replete as needed.  # Iron  deficiency, transferrin saturation 9% Avoided IV iron  due to infection Started oral iron  supplement with vitamin C . Follow with PCP to repeat iron  profile after 3 months  # Hyperlipidemia, continue statin # Depression, continue Zoloft   Body mass index is 20.7 kg/m.  Interventions:  Diet: CLD DVT Prophylaxis: Subcutaneous Heparin     Advance goals of care discussion: Full code  Family Communication: family was not present at bedside, at the time of interview.  The pt provided permission to discuss medical plan with the family. Opportunity was  given to ask question and all questions were answered satisfactorily.   Disposition:  Pt is from Home, admitted with sepsis due to acute gastroenteritis, still has intractable abdominal pain, nausea vomiting, electrolyte imbalance, which precludes a safe discharge. Discharge to home, when stable, may need few days to improve.  Subjective: Patient was admitted overnight due to acute gastroenteritis, still complaining of generalized severe abdominal pain 8/10, still having nausea and vomiting, no diarrhea today. Denied any chest pain or palpitation, no shortness of breath.  Physical Exam: General: NAD, lying comfortably Appear in no distress, affect appropriate Eyes: PERRLA ENT: Oral Mucosa Clear, moist  Neck: no JVD,  Cardiovascular: S1 and S2 Present, no Murmur,  Respiratory: good respiratory effort, Bilateral Air entry equal and Decreased, no Crackles, no wheezes Abdomen: BS present, Soft and mild generalized tenderness  Skin: no rashes Extremities: no Pedal edema, no calf tenderness Neurologic: without any new focal findings Gait not checked due to patient safety concerns  Vitals:   08/02/23 0653 08/02/23 0700 08/02/23 1037 08/02/23 1049  BP:  (!) 100/51  117/70  Pulse:  80  72  Resp:  15  13  Temp: 98.6 F (37 C)  98.3 F (36.8 C)   TempSrc: Oral  Oral   SpO2:  100%  100%  Weight:      Height:        Intake/Output Summary (Last 24 hours) at 08/02/2023 1358 Last data filed at 08/01/2023 1817 Gross per 24 hour  Intake 100 ml  Output --  Net 100 ml   Filed Weights   08/01/23 1344  Weight: 53 kg  Data Reviewed: I have personally reviewed and interpreted daily labs, tele strips, imagings as discussed above. I reviewed all nursing notes, pharmacy notes, vitals, pertinent old records I have discussed plan of care as described above with RN and patient/family.  CBC: Recent Labs  Lab 08/01/23 1347 08/02/23 0606  WBC 14.8* 12.8*  NEUTROABS 11.9*  --   HGB 12.6  10.0*  HCT 36.6 30.1*  MCV 92.2 94.4  PLT 241 173   Basic Metabolic Panel: Recent Labs  Lab 08/01/23 1347 08/01/23 2236 08/02/23 0606  NA 137  --  140  K 3.1*  --  3.6  CL 105  --  115*  CO2 20*  --  18*  GLUCOSE 125*  --  109*  BUN 11  --  9  CREATININE 0.99  --  0.84  CALCIUM  9.6  --  8.2*  MG  --  1.5* 1.9  PHOS  --   --  1.7*    Studies: CT ABDOMEN PELVIS W CONTRAST Result Date: 08/01/2023 CLINICAL DATA:  Sepsis.  Abdominal pain EXAM: CT ABDOMEN AND PELVIS WITH CONTRAST TECHNIQUE: Multidetector CT imaging of the abdomen and pelvis was performed using the standard protocol following bolus administration of intravenous contrast. RADIATION DOSE REDUCTION: This exam was performed according to the departmental dose-optimization program which includes automated exposure control, adjustment of the mA and/or kV according to patient size and/or use of iterative reconstruction technique. CONTRAST:  75mL OMNIPAQUE  IOHEXOL  350 MG/ML SOLN COMPARISON:  None Available. FINDINGS: Lower chest: No acute findings Hepatobiliary: No focal hepatic abnormality. Gallbladder unremarkable. Pancreas: No focal abnormality or ductal dilatation. Spleen: No focal abnormality.  Normal size. Adrenals/Urinary Tract: No adrenal abnormality. No focal renal abnormality. No stones or hydronephrosis. Urinary bladder is unremarkable. Stomach/Bowel: Stomach, large and small bowel grossly unremarkable. Vascular/Lymphatic: Aortic atherosclerosis. No evidence of aneurysm or adenopathy. Reproductive: Uterus and adnexa unremarkable.  No mass. Other: No free fluid or free air. Musculoskeletal: No acute bony abnormality. IMPRESSION: No acute findings in the abdomen or pelvis. Aortic atherosclerosis. Electronically Signed   By: Franky Crease M.D.   On: 08/01/2023 19:19   CT Angio Chest PE W and/or Wo Contrast Result Date: 08/01/2023 CLINICAL DATA:  Pulmonary embolism (PE) suspected, high prob. Abdominal pain. Shortness of breath, chest  pain. EXAM: CT ANGIOGRAPHY CHEST WITH CONTRAST TECHNIQUE: Multidetector CT imaging of the chest was performed using the standard protocol during bolus administration of intravenous contrast. Multiplanar CT image reconstructions and MIPs were obtained to evaluate the vascular anatomy. RADIATION DOSE REDUCTION: This exam was performed according to the departmental dose-optimization program which includes automated exposure control, adjustment of the mA and/or kV according to patient size and/or use of iterative reconstruction technique. CONTRAST:  75mL OMNIPAQUE  IOHEXOL  350 MG/ML SOLN COMPARISON:  None Available. FINDINGS: Cardiovascular: No filling defects in the pulmonary arteries to suggest pulmonary emboli. Heart is normal size. Aorta is normal caliber. Mediastinum/Nodes: No mediastinal, hilar, or axillary adenopathy. Trachea and esophagus are unremarkable. Thyroid  unremarkable. Lungs/Pleura: No confluent airspace opacities or effusions. Minimal dependent atelectasis. Upper Abdomen: No acute findings Musculoskeletal: Chest wall soft tissues are unremarkable. No acute bony abnormality. Review of the MIP images confirms the above findings. IMPRESSION: No evidence of pulmonary embolus. Minimal dependent atelectasis in the lower lobes. No acute cardiopulmonary disease. Electronically Signed   By: Franky Crease M.D.   On: 08/01/2023 19:17   DG Chest 2 View Result Date: 08/01/2023 CLINICAL DATA:  Chest pain. EXAM: CHEST - 2 VIEW COMPARISON:  08/30/2008.  FINDINGS: The heart size and mediastinal contours are within normal limits. No focal consolidation, pleural effusion, or pneumothorax. No acute osseous abnormality. IMPRESSION: No acute cardiopulmonary findings. Electronically Signed   By: Harrietta Sherry M.D.   On: 08/01/2023 14:06    Scheduled Meds:  vitamin C   500 mg Oral Daily   famotidine   40 mg Oral QHS   heparin   5,000 Units Subcutaneous Q8H   iron  polysaccharides  150 mg Oral Daily   multivitamin with  minerals  1 tablet Oral Daily   rosuvastatin   20 mg Oral Daily   saccharomyces boulardii  250 mg Oral BID   sertraline   150 mg Oral Daily   Continuous Infusions:  ceFEPime  (MAXIPIME ) IV Stopped (08/02/23 1109)   lactated ringers  150 mL/hr at 08/02/23 1118   metronidazole  Stopped (08/02/23 1109)   potassium PHOSPHATE  IVPB (in mmol)     promethazine  (PHENERGAN ) injection (IM or IVPB)     vancomycin      PRN Meds: acetaminophen  **OR** acetaminophen , clonazePAM , ondansetron  **OR** ondansetron  (ZOFRAN ) IV, promethazine  (PHENERGAN ) injection (IM or IVPB)  Time spent: 55 minutes  Author: ELVAN SOR. MD [facility] 08/02/2023 1:58 PM  To reach On-call, see care teams to locate the attending and reach out to them via [email or web address]. If 7PM-7AM, please contact night-coverage If you still have difficulty reaching the attending provider, please page the [facility] (Director on Call) for [facility] on amion for assistance.

## 2023-08-03 DIAGNOSIS — A419 Sepsis, unspecified organism: Secondary | ICD-10-CM | POA: Diagnosis not present

## 2023-08-03 LAB — BASIC METABOLIC PANEL WITH GFR
Anion gap: 8 (ref 5–15)
BUN: 7 mg/dL — ABNORMAL LOW (ref 8–23)
CO2: 22 mmol/L (ref 22–32)
Calcium: 8.1 mg/dL — ABNORMAL LOW (ref 8.9–10.3)
Chloride: 111 mmol/L (ref 98–111)
Creatinine, Ser: 0.74 mg/dL (ref 0.44–1.00)
GFR, Estimated: >60 mL/min (ref >60)
Glucose, Bld: 103 mg/dL — ABNORMAL HIGH (ref 70–99)
Potassium: 3.5 mmol/L (ref 3.5–5.1)
Sodium: 141 mmol/L (ref 135–145)

## 2023-08-03 LAB — CBC
HCT: 28 % — ABNORMAL LOW (ref 36.0–46.0)
Hemoglobin: 9.5 g/dL — ABNORMAL LOW (ref 12.0–15.0)
MCH: 30.8 pg (ref 26.0–34.0)
MCHC: 33.9 g/dL (ref 30.0–36.0)
MCV: 90.9 fL (ref 80.0–100.0)
Platelets: 174 K/uL (ref 150–400)
RBC: 3.08 MIL/uL — ABNORMAL LOW (ref 3.87–5.11)
RDW: 12.8 % (ref 11.5–15.5)
WBC: 9.5 K/uL (ref 4.0–10.5)
nRBC: 0 % (ref 0.0–0.2)

## 2023-08-03 LAB — PHOSPHORUS: Phosphorus: 2.9 mg/dL (ref 2.5–4.6)

## 2023-08-03 LAB — MAGNESIUM: Magnesium: 1.8 mg/dL (ref 1.7–2.4)

## 2023-08-03 MED ORDER — RIVAROXABAN 10 MG PO TABS
10.0000 mg | ORAL_TABLET | Freq: Every day | ORAL | Status: DC
  Administered 2023-08-03: 10 mg via ORAL
  Filled 2023-08-03 (×2): qty 1

## 2023-08-03 NOTE — Care Management Important Message (Signed)
 Important Message  Patient Details  Name: Shari Rivera MRN: [id number] Date of Birth: May 01, 1955   Important Message Given:  Yes - Medicare IM     [name] 08/03/2023, 11:59 AM

## 2023-08-03 NOTE — Progress Notes (Signed)
 [facility] Progress Note  Patient: Shari Rivera    FMW:[id number]  DOA: 08/01/2023     Date of Service: the patient was seen and examined on 08/03/2023  Chief Complaint  Patient presents with   Emesis   Chest Pain   Near Syncope   Brief hospital course: Ms. Veeda Virgo is a 68 year old female with history of GERD, hyperlipidemia, depression, who presents emergency department for chief concerns of abdominal pain, diarrhea, shortness of breath for past one week.    ED w/up: Tmax 97.6, RR 24, HR 115--91, BP 90/63>> 128/38, SpO2 100% on room air.   BMP: Na 137, K 3.1, chl 105, bicarb 20, BUN 11, sCr 0.99, eGFR> 60, nonfasting BG 125, WBC 14.8, Hb 12.6, plts  of 241.  COVID/influenza A/influenza B/RSV PCR  Blood culture  TRH was consulted for admission and further management as below.  Assessment and Plan:  # Sepsis secondary to gastroenteritis, unknown source of infection Stool for C. difficile and GI pathogen order placed but could not be collected as patient had no diarrhea after admission. S/P vancomycin , cefepime  and Flagyl .  Discontinued and started ceftriaxone  due to E. coli bacteremia S/P IV fluid for hydration Continue PPI Started probiotics S/P CLD, advance to dysphagia 2 diet due to loss of teeth Blood culture growing E. Coli, sensitivities pending Continue ceftriaxone  2 g IV daily,  # Metabolic acidosis secondary to acute gastroenteritis Bicarb depleted Follow repeat BMP  # Hypokalemia, potassium repleted. # Hypomagnesemia, mag repleted # Hypophosphatemia, Phos repleted. Monitor lites and replete as needed.  # Iron  deficiency, transferrin saturation 9% Avoided IV iron  due to infection Started oral iron  supplement with vitamin C . Follow with PCP to repeat iron  profile after 3 months  # Hyperlipidemia, continue statin # Depression, continue Zoloft   # Marijuana use disorder.  UDS positive for marijuana.  Cessation counseling done   Body mass index  is 20.7 kg/m.  Interventions:  Diet: Dysphagia 2 diet DVT Prophylaxis: Subcutaneous Heparin     Advance goals of care discussion: Full code  Family Communication: family was not present at bedside, at the time of interview.  The pt provided permission to discuss medical plan with the family. Opportunity was given to ask question and all questions were answered satisfactorily.   Disposition:  Pt is from Home, admitted with sepsis due to acute gastroenteritis, still has intractable abdominal pain, nausea vomiting, electrolyte imbalance, which precludes a safe discharge. Discharge to home, when stable, may need few days to improve.  Subjective: No significant events overnight, patient woke up without any symptoms and later on she had vomiting after eating food, still has abdominal pain, no diarrhea. Patient is having difficulty eating food because of losing all her teeth and dentures are not fitting well. Discussed with the speech and swallow who recommended dysphagia 2 diet.  Physical Exam: General: NAD, lying comfortably Appear in no distress, affect appropriate Eyes: PERRLA ENT: Oral Mucosa Clear, moist  Neck: no JVD,  Cardiovascular: S1 and S2 Present, no Murmur,  Respiratory: good respiratory effort, Bilateral Air entry equal and Decreased, no Crackles, no wheezes Abdomen: BS present, Soft and mild generalized tenderness  Skin: no rashes Extremities: no Pedal edema, no calf tenderness Neurologic: without any new focal findings Gait not checked due to patient safety concerns  Vitals:   08/03/23 0016 08/03/23 0524 08/03/23 0859 08/03/23 1230  BP: 107/60 102/60 (!) 106/55 121/67  Pulse: 73 70 70 79  Resp: 17 18 18 18   Temp: 98.3 F (36.8 C)  98.4 F (36.9 C) 98.1 F (36.7 C) 98.2 F (36.8 C)  TempSrc:      SpO2: 99% 98% 98% 96%  Weight:      Height:       No intake or output data in the 24 hours ending 08/03/23 1335  Filed Weights   08/01/23 1344  Weight: 53 kg     Data Reviewed: I have personally reviewed and interpreted daily labs, tele strips, imagings as discussed above. I reviewed all nursing notes, pharmacy notes, vitals, pertinent old records I have discussed plan of care as described above with RN and patient/family.  CBC: Recent Labs  Lab 08/01/23 1347 08/02/23 0606 08/03/23 0512  WBC 14.8* 12.8* 9.5  NEUTROABS 11.9*  --   --   HGB 12.6 10.0* 9.5*  HCT 36.6 30.1* 28.0*  MCV 92.2 94.4 90.9  PLT 241 173 174   Basic Metabolic Panel: Recent Labs  Lab 08/01/23 1347 08/01/23 2236 08/02/23 0606 08/02/23 1457 08/03/23 0512  NA 137  --  140 141 141  K 3.1*  --  3.6 3.8 3.5  CL 105  --  115* 109 111  CO2 20*  --  18* 20* 22  GLUCOSE 125*  --  109* 120* 103*  BUN 11  --  9 8 7*  CREATININE 0.99  --  0.84 0.82 0.74  CALCIUM  9.6  --  8.2* 8.9 8.1*  MG  --  1.5* 1.9 1.9 1.8  PHOS  --   --  1.7* 1.2* 2.9    Studies: No results found.   Scheduled Meds:  vitamin C   500 mg Oral Daily   heparin   5,000 Units Subcutaneous Q8H   iron  polysaccharides  150 mg Oral Daily   multivitamin with minerals  1 tablet Oral Daily   pantoprazole   40 mg Oral BID   rosuvastatin   20 mg Oral Daily   saccharomyces boulardii  250 mg Oral BID   sertraline   150 mg Oral Daily   Continuous Infusions:  cefTRIAXone  (ROCEPHIN )  IV 2 g (08/02/23 2117)   lactated ringers  Stopped (08/03/23 1112)   PRN Meds: acetaminophen  **OR** acetaminophen , clonazePAM , ondansetron  **OR** ondansetron  (ZOFRAN ) IV  Time spent: 40 minutes  Author: ELVAN SOR. MD [facility] 08/03/2023 1:35 PM  To reach On-call, see care teams to locate the attending and reach out to them via [email or web address]. If 7PM-7AM, please contact night-coverage If you still have difficulty reaching the attending provider, please page the [facility] (Director on Call) for [facility] on amion for assistance.

## 2023-08-03 NOTE — Plan of Care (Signed)

## 2023-08-03 NOTE — Progress Notes (Signed)
 Transition of Care [facility]) - Inpatient Brief Assessment   Patient Details  Name: Shari Rivera MRN: [id number] Date of Birth: Sep 09, 1955  Transition of Care [facility]) CM/SW Contact:    Sacha Radloff C Heddy Vidana, RN Phone Number: 08/03/2023, 2:56 PM   Clinical Narrative: TOC continuing to follow patient's progress throughout discharge planning.   Transition of Care Asessment: Insurance and Status: Insurance coverage has been reviewed Patient has primary care physician: Yes   Prior level of function:: Independent Prior/Current Home Services: No current home services Social Drivers of Health Review: SDOH reviewed no interventions necessary Readmission risk has been reviewed: Yes Transition of care needs: no transition of care needs at this time

## 2023-08-03 NOTE — Evaluation (Signed)
 Clinical/Bedside Swallow Evaluation Patient Details  Name: Shari Rivera MRN: [id number] Date of Birth: March 05, 1955  Today's Date: 08/03/2023 Time: SLP Start Time (ACUTE ONLY): 1435 SLP Stop Time (ACUTE ONLY): 1520 SLP Time Calculation (min) (ACUTE ONLY): 45 min  Past Medical History:  Past Medical History:  Diagnosis Date   Allergy    Anemia    Anxiety    Arthritis    Cancer (HCC)    Carpal tunnel syndrome, bilateral    Cataract    COVID-19    Depression    Dyspnea    former smoker   Endometriosis    Family history of adverse reaction to anesthesia    Mother difficult to wake up   GERD (gastroesophageal reflux disease)    Pneumonia    Pre-diabetes    Past Surgical History:  Past Surgical History:  Procedure Laterality Date   APPENDECTOMY     CARPAL TUNNEL RELEASE Left 11/30/2022   Procedure: LEFT CARPAL TUNNEL RELEASE WITH ULTRASOUND GUIDANCE;  Surgeon: Claudene Penne ORN, MD;  Location: [facility] ORS;  Service: Neurosurgery;  Laterality: Left;  LOCAL W/ MAC   COLONOSCOPY N/A 06/28/2023   Procedure: COLONOSCOPY;  Surgeon: Jinny Carmine, MD;  Location: [facility] ENDOSCOPY;  Service: Endoscopy;  Laterality: N/A;   DIAGNOSTIC LAPAROSCOPY     endometrosis   EYE SURGERY     cataract bilateral with lens replaced   NASAL SEPTUM SURGERY     SMALL INTESTINE SURGERY     HPI:  Pt is a 68 year old female with history of GERD, hyperlipidemia, depression, and Edentulous who presents emergency department for chief concerns of abdominal pain, diarrhea, shortness of breath.     Vitals in the ED showed temperature of 97.6, respiration rate of 24 initially, heart rate initially 115 and improved to 91, blood pressure 90/63, improved to 128/38, SpO2 100% on room air.  Sepsis and Metabolic acidosis secondary to acute gastroenteritis dx'd at admit.   Chest Imaging: Lungs/Pleura: No confluent airspace opacities or effusions. Minimal  dependent atelectasis.    Assessment / Plan / Recommendation  Clinical  Impression   Pt seen for BSE today per MD's request in setting of pt's Edentulous status and c/o w/ hard to chew foods. Pt A/O x4. Verbal and followed all instructions. Pt indicated concern w/ eating certain foods d/t Edentulous status and a choking feeling from some foods that she could not mash/gum well- i just cannot chew food anymore. She described her diet at home that includes a PB&J sandwich, beans, cottage cheese, fruits (no issues). She has Not worn her Dentures for several years per report.  On RA, afebrile. WBC WNL.  Pt appears to present at her Baseline w/ functional oropharyngeal phase swallow w/ No oropharyngeal phase dysphagia noted, No neuromuscular deficits noted. Pt consumed po trials w/ No overt, clinical s/s of aspiration during po trials. Pt appears at reduced risk for aspiration following general aspiration precautions. However, pt does have challenging factors that could impact her oropharyngeal swallowing to include Edentulous status- she is unable to wear her Dentures at Baseline(several years now), deconditioning/weakness, and GERD. These factors can increase risk for dysphagia as well as decreased oral intake overall.   During po trials, pt consumed all consistencies w/ no overt coughing, decline in vocal quality, or change in respiratory presentation during/post trials. O2 sats remained 99%. Oral phase appeared grossly [facility] w/ timely bolus management, mashing/gumming of softened foods, and control of bolus propulsion for A-P transfer for swallowing. Oral clearing achieved w/ all trial consistencies --  moistened, soft foods given(no hard foods). Demonstrated how the bites of food were chopped small and moistened well w/ Applesauce.  OM Exam appeared [facility] w/ no unilateral weakness noted. Speech Clear. Pt fed self.   Recommend a Regular consistency diet(for ability to choose foods that she will mash/chop/moisten for eating/gumming) w/ well-Chopped meats/foods, well-moistened  foods; Thin liquids. Recommend general aspiration precautions, Small bites for best control of mashing/gumming. GERD precautions. Pills WHOLE in Puree for safer, easier swallowing -- pt practiced this during session w/ SLP.  Education given on Pills in Puree; food consistencies and easy to eat/gum options; food prep options(soups and condiments to moisten foods); general aspiration and Reflux precautions to pt. NSG to reconsult if any new needs arise during admit. MD/NSG/Dietician updated, MD agreed. Recommend Dietician f/u for support. Precautions posted in room, chart. SLP Visit Diagnosis: Dysphagia, unspecified (R13.10) (Edentulous status Baseline- cannot wear dentures.)    Aspiration Risk   (reduced following general precs.)    Diet Recommendation   Thin;Age appropriate regular (for ability to choose foods that she will mash/chop/moisten for eating/gumming) = a Regular consistency diet(for ability to choose foods that she will mash/chop/moisten for eating/gumming) w/ well-Chopped meats/foods, well-moistened foods; Thin liquids. Recommend general aspiration precautions, Small bites for best control of mashing/gumming. GERD precautions.   Medication Administration: Whole meds with puree (as needed for ease of swallow)    Other  Recommendations Recommended Consults:  (Dietician f/u) Oral Care Recommendations: Oral care BID;Patient independent with oral care     Assistance Recommended at Discharge  PRN  Functional Status Assessment Patient has not had a recent decline in their functional status  Frequency and Duration  (n/a)   (n/a)       Prognosis Prognosis for improved oropharyngeal function: Good Barriers to Reach Goals: Time post onset;Severity of deficits Barriers/Prognosis Comment: Edentulous status at baseline      Swallow Study   General Date of Onset: 08/01/23 HPI: Pt is a 68 year old female with history of GERD, hyperlipidemia, depression, and Edentulous who presents emergency  department for chief concerns of abdominal pain, diarrhea, shortness of breath.     Vitals in the ED showed temperature of 97.6, respiration rate of 24 initially, heart rate initially 115 and improved to 91, blood pressure 90/63, improved to 128/38, SpO2 100% on room air.  Sepsis and Metabolic acidosis secondary to acute gastroenteritis dx'd at admit.   Chest Imaging: Lungs/Pleura: No confluent airspace opacities or effusions. Minimal  dependent atelectasis. Type of Study: Bedside Swallow Evaluation Previous Swallow Assessment: none Diet Prior to this Study: Dysphagia 2 (finely chopped);Thin liquids (Level 0) (per discussion w/ MD in setting of Edentulous status) Temperature Spikes Noted: No (wbc 9.5) Respiratory Status: Room air History of Recent Intubation: No Behavior/Cognition: Alert;Cooperative;Pleasant mood Oral Cavity Assessment: Within Functional Limits Oral Care Completed by SLP: Recent completion by staff Oral Cavity - Dentition: Edentulous (cannot wear her Dentures anymore- several years now) Vision: Functional for self-feeding Self-Feeding Abilities: Able to feed self Patient Positioning: Upright in bed Baseline Vocal Quality: Normal Volitional Cough: Strong Volitional Swallow: Able to elicit    Oral/Motor/Sensory Function Overall Oral Motor/Sensory Function: Within functional limits   Ice Chips Ice chips: Not tested   Thin Liquid Thin Liquid: Within functional limits Presentation: Cup;Self Fed;Straw (~4 ozs)    Nectar Thick Nectar Thick Liquid: Not tested   Honey Thick Honey Thick Liquid: Not tested   Puree Puree: Within functional limits Presentation: Self Fed;Spoon (5-6 trials)   Solid  Solid: Impaired (edentulous) Presentation: Self Fed;Spoon (6 trials) Oral Phase Impairments: Impaired mastication (edentulous) Pharyngeal Phase Impairments:  (none) Other Comments: moistened food well        Comer Portugal, MS, CCC-SLP Speech Language Pathologist Rehab  Services; [facility] [phone] (ascom) Marimar Suber 08/03/2023,6:34 PM

## 2023-08-04 ENCOUNTER — Other Ambulatory Visit: Payer: Self-pay

## 2023-08-04 DIAGNOSIS — A419 Sepsis, unspecified organism: Secondary | ICD-10-CM | POA: Diagnosis not present

## 2023-08-04 LAB — BASIC METABOLIC PANEL WITH GFR
Anion gap: 10 (ref 5–15)
BUN: 6 mg/dL — ABNORMAL LOW (ref 8–23)
CO2: 22 mmol/L (ref 22–32)
Calcium: 8.9 mg/dL (ref 8.9–10.3)
Chloride: 109 mmol/L (ref 98–111)
Creatinine, Ser: 0.82 mg/dL (ref 0.44–1.00)
GFR, Estimated: >60 mL/min (ref >60)
Glucose, Bld: 104 mg/dL — ABNORMAL HIGH (ref 70–99)
Potassium: 3.5 mmol/L (ref 3.5–5.1)
Sodium: 141 mmol/L (ref 135–145)

## 2023-08-04 LAB — CBC
HCT: 32.3 % — ABNORMAL LOW (ref 36.0–46.0)
Hemoglobin: 10.9 g/dL — ABNORMAL LOW (ref 12.0–15.0)
MCH: 31.1 pg (ref 26.0–34.0)
MCHC: 33.7 g/dL (ref 30.0–36.0)
MCV: 92.3 fL (ref 80.0–100.0)
Platelets: 268 K/uL (ref 150–400)
RBC: 3.5 MIL/uL — ABNORMAL LOW (ref 3.87–5.11)
RDW: 13 % (ref 11.5–15.5)
WBC: 9.5 K/uL (ref 4.0–10.5)
nRBC: 0 % (ref 0.0–0.2)

## 2023-08-04 LAB — CULTURE, BLOOD (ROUTINE X 2)

## 2023-08-04 LAB — MAGNESIUM: Magnesium: 2 mg/dL (ref 1.7–2.4)

## 2023-08-04 LAB — PHOSPHORUS: Phosphorus: 2.5 mg/dL (ref 2.5–4.6)

## 2023-08-04 MED ORDER — ACETAMINOPHEN 325 MG PO TABS: 650.0000 mg | ORAL_TABLET | Freq: Four times a day (QID) | ORAL | Status: AC | PRN

## 2023-08-04 MED ORDER — SACCHAROMYCES BOULARDII 250 MG PO CAPS
250.0000 mg | ORAL_CAPSULE | Freq: Two times a day (BID) | ORAL | 0 refills | Status: AC
  Filled 2023-08-04: qty 20, 10d supply, fill #0

## 2023-08-04 MED ORDER — POLYSACCHARIDE IRON COMPLEX 150 MG PO CAPS
150.0000 mg | ORAL_CAPSULE | Freq: Every day | ORAL | 2 refills | Status: AC
  Filled 2023-08-04: qty 30, 30d supply, fill #0

## 2023-08-04 MED ORDER — ASCORBIC ACID 500 MG PO TABS
500.0000 mg | ORAL_TABLET | Freq: Every day | ORAL | 2 refills | Status: AC
  Filled 2023-08-04: qty 30, 30d supply, fill #0

## 2023-08-04 MED ORDER — CIPROFLOXACIN HCL 500 MG PO TABS
500.0000 mg | ORAL_TABLET | Freq: Two times a day (BID) | ORAL | 0 refills | Status: AC
  Filled 2023-08-04: qty 20, 10d supply, fill #0

## 2023-08-04 MED ORDER — PANTOPRAZOLE SODIUM 40 MG PO TBEC
40.0000 mg | DELAYED_RELEASE_TABLET | Freq: Every day | ORAL | 0 refills | Status: AC
  Filled 2023-08-04: qty 14, 14d supply, fill #0

## 2023-08-04 NOTE — Discharge Summary (Signed)
 [facility] Discharge Summary   Patient: Shari Rivera FMW:[id number]  PCP: Sharma Coyer, MD  Date of admission: 08/01/2023   Date of discharge:  08/04/2023     Discharge Diagnoses:  Principal Problem:   Sepsis (HCC) Active Problems:   Esophageal dysphagia   Recurrent major depression (HCC)   Hyperlipidemia   GERD (gastroesophageal reflux disease)   Hypokalemia   Intractable nausea and vomiting   Admitted From: Home Disposition:  Home   Recommendations for Outpatient Follow-up:  PCP: In 1 week Follow up LABS/TEST:  repeat iron  profile after 3 months   Follow-up Information     Simmons-Robinson, Makiera, MD Follow up in 1 week(s).   Specialty: Family Medicine Why: Please call to set appointment. Contact information: [address] [place] [address] [phone]                Diet recommendation: Cardiac diet  Activity: The patient is advised to gradually reintroduce usual activities, as tolerated  Discharge Condition: stable  Code Status: Full code   History of present illness: As per the H and P dictated on admission  Hospital Course:  Shari Rivera is a 68 year old female with history of GERD, hyperlipidemia, depression, who presents emergency department for chief concerns of abdominal pain, diarrhea, shortness of breath for past one week.    ED w/up: Tmax 97.6, RR 24, HR 115--91, BP 90/63>> 128/38, SpO2 100% on room air.   BMP: Na 137, K 3.1, chl 105, bicarb 20, BUN 11, sCr 0.99, eGFR> 60, nonfasting BG 125, WBC 14.8, Hb 12.6, plts  of 241.   COVID/influenza A/influenza B/RSV PCR  Blood culture   [facility] was consulted for admission and further management as below.   Assessment and Plan:   # Sepsis secondary to gastroenteritis, unknown source of infection. Stool for C. difficile and GI pathogen order placed but could not be collected as patient had no diarrhea after admission. S/P vancomycin , cefepime  and  Flagyl .  Discontinued and started ceftriaxone  due to E. coli bacteremia. S/P IV fluid for hydration Continue PPI, Started probiotics S/P CLD, advance to dysphagia 2 diet due to loss of teeth Blood culture growing E. Coli, sensitivities pending S/p Ceftriaxone  2 g IV daily.  Patient was discharged on ciprofloxacin  500 mg p.o. twice daily for 10 days.  Probiotics twice daily for 10 days   # Metabolic acidosis secondary to acute gastroenteritis Bicarb depleted and resolved # Hypokalemia, potassium repleted.  Resolved # Hypomagnesemia, mag repleted.  Resolved # Hypophosphatemia, Phos repleted.  Resolved # Iron  deficiency, transferrin saturation 9% Avoided IV iron  due to infection. Started oral iron  supplement with vitamin C . Follow with PCP to repeat iron  profile after 3 months   # Hyperlipidemia, continue statin # Depression, continue Zoloft  # Marijuana use disorder.  UDS positive for marijuana.  Cessation counseling done    Body mass index is 20.7 kg/m.  Nutrition Interventions:  - Patient was instructed, not to drive, operate heavy machinery, perform activities at heights, swimming or participation in water activities or provide baby sitting services while on Pain, Sleep and Anxiety Medications; until her outpatient Physician has advised to do so again.  - Also recommended to not to take more than prescribed Pain, Sleep and Anxiety Medications.  Patient was ambulatory without any assistance. On the day of the discharge the patient's vitals were stable, and no other acute medical condition were reported by patient. the patient was felt safe to be discharge at Home.  Consultants: None Procedures:  None  Discharge Exam: General: Appear in no distress, no Rash; Oral Mucosa Clear, moist. Cardiovascular: S1 and S2 Present, no Murmur, Respiratory: normal respiratory effort, Bilateral Air entry present and no Crackles, no wheezes Abdomen: Bowel Sound present, Soft and no tenderness, no  hernia Extremities: no Pedal edema, no calf tenderness Neurology: alert and oriented to time, place, and person affect appropriate.  Filed Weights   08/01/23 1344  Weight: 53 kg   Vitals:   08/04/23 0442 08/04/23 0749  BP: 123/71 123/71  Pulse: 82 72  Resp: 18 18  Temp: (!) 97.1 F (36.2 C) 97.6 F (36.4 C)  SpO2: 95% 100%    DISCHARGE MEDICATION: Allergies as of 08/04/2023       Reactions   Codeine Diarrhea, Nausea And Vomiting        Medication List     STOP taking these medications    naproxen sodium 220 MG tablet Commonly known as: ALEVE   terbinafine  250 MG tablet Commonly known as: LAMISIL        TAKE these medications    acetaminophen  325 MG tablet Commonly known as: TYLENOL  Take 2 tablets (650 mg total) by mouth every 6 (six) hours as needed for mild pain (pain score 1-3), fever, headache or moderate pain (pain score 4-6) (or Fever >/= 101).   alendronate  70 MG tablet Commonly known as: FOSAMAX  TAKE 1 TABLET EVERY 7 DAYS WITH A FULL GLASS OF WATER ON AN EMPTY STOMACH DO NOT LIE DOWN FOR AT LEAST 30 MIN   Aleve PM 220-25 MG Tabs Generic drug: Naproxen Sod-diphenhydrAMINE Take 2 capsules by mouth at bedtime.   ascorbic acid  500 MG tablet Commonly known as: VITAMIN C  Take 1 tablet (500 mg total) by mouth daily. Start taking on: August 05, 2023   Calcium  Carbonate-Vitamin D  600-400 MG-UNIT tablet Take 1 tablet by mouth daily.   ciprofloxacin  500 MG tablet Commonly known as: Cipro  Take 1 tablet (500 mg total) by mouth 2 (two) times daily for 10 days.   clonazePAM  0.5 MG tablet Commonly known as: KLONOPIN  Take 1 tablet (0.5 mg total) by mouth at bedtime as needed for anxiety.   CoQ-10 200 MG Caps Take 200 mg by mouth at bedtime.   famotidine  40 MG tablet Commonly known as: PEPCID  TAKE ONE TABLET BY MOUTH AT BEDTIME   fluticasone  50 MCG/ACT nasal spray Commonly known as: FLONASE  Place 2 sprays into both nostrils daily.   iron   polysaccharides 150 MG capsule Commonly known as: NIFEREX Take 1 capsule (150 mg total) by mouth daily. Start taking on: August 05, 2023   multivitamin with minerals Tabs tablet Take 1 tablet by mouth daily.   pantoprazole  40 MG tablet Commonly known as: PROTONIX  Take 1 tablet (40 mg total) by mouth daily for 14 days.   rosuvastatin  20 MG tablet Commonly known as: Crestor  Take 1 tablet (20 mg total) by mouth daily.   saccharomyces boulardii 250 MG capsule Commonly known as: FLORASTOR Take 1 capsule (250 mg total) by mouth 2 (two) times daily for 10 days.   sertraline  100 MG tablet Commonly known as: ZOLOFT  Take 1.5 tablets (150 mg total) by mouth daily.       Allergies  Allergen Reactions   Codeine Diarrhea and Nausea And Vomiting   Discharge Instructions     Call MD for:   Complete by: As directed    Diarrhea   Call MD for:  difficulty breathing, headache or visual disturbances   Complete by: As directed  Call MD for:  extreme fatigue   Complete by: As directed    Call MD for:  persistant dizziness or light-headedness   Complete by: As directed    Call MD for:  persistant nausea and vomiting   Complete by: As directed    Call MD for:  severe uncontrolled pain   Complete by: As directed    Call MD for:  temperature >100.4   Complete by: As directed    Diet general   Complete by: As directed    Discharge instructions   Complete by: As directed    F/u with PCP in 1 wk   Increase activity slowly   Complete by: As directed        The results of significant diagnostics from this hospitalization (including imaging, microbiology, ancillary and laboratory) are listed below for reference.    Significant Diagnostic Studies: CT ABDOMEN PELVIS W CONTRAST Result Date: 08/01/2023 CLINICAL DATA:  Sepsis.  Abdominal pain EXAM: CT ABDOMEN AND PELVIS WITH CONTRAST TECHNIQUE: Multidetector CT imaging of the abdomen and pelvis was performed using the standard protocol  following bolus administration of intravenous contrast. RADIATION DOSE REDUCTION: This exam was performed according to the departmental dose-optimization program which includes automated exposure control, adjustment of the mA and/or kV according to patient size and/or use of iterative reconstruction technique. CONTRAST:  75mL OMNIPAQUE  IOHEXOL  350 MG/ML SOLN COMPARISON:  None Available. FINDINGS: Lower chest: No acute findings Hepatobiliary: No focal hepatic abnormality. Gallbladder unremarkable. Pancreas: No focal abnormality or ductal dilatation. Spleen: No focal abnormality.  Normal size. Adrenals/Urinary Tract: No adrenal abnormality. No focal renal abnormality. No stones or hydronephrosis. Urinary bladder is unremarkable. Stomach/Bowel: Stomach, large and small bowel grossly unremarkable. Vascular/Lymphatic: Aortic atherosclerosis. No evidence of aneurysm or adenopathy. Reproductive: Uterus and adnexa unremarkable.  No mass. Other: No free fluid or free air. Musculoskeletal: No acute bony abnormality. IMPRESSION: No acute findings in the abdomen or pelvis. Aortic atherosclerosis. Electronically Signed   By: Franky Crease M.D.   On: 08/01/2023 19:19   CT Angio Chest PE W and/or Wo Contrast Result Date: 08/01/2023 CLINICAL DATA:  Pulmonary embolism (PE) suspected, high prob. Abdominal pain. Shortness of breath, chest pain. EXAM: CT ANGIOGRAPHY CHEST WITH CONTRAST TECHNIQUE: Multidetector CT imaging of the chest was performed using the standard protocol during bolus administration of intravenous contrast. Multiplanar CT image reconstructions and MIPs were obtained to evaluate the vascular anatomy. RADIATION DOSE REDUCTION: This exam was performed according to the departmental dose-optimization program which includes automated exposure control, adjustment of the mA and/or kV according to patient size and/or use of iterative reconstruction technique. CONTRAST:  75mL OMNIPAQUE  IOHEXOL  350 MG/ML SOLN COMPARISON:   None Available. FINDINGS: Cardiovascular: No filling defects in the pulmonary arteries to suggest pulmonary emboli. Heart is normal size. Aorta is normal caliber. Mediastinum/Nodes: No mediastinal, hilar, or axillary adenopathy. Trachea and esophagus are unremarkable. Thyroid  unremarkable. Lungs/Pleura: No confluent airspace opacities or effusions. Minimal dependent atelectasis. Upper Abdomen: No acute findings Musculoskeletal: Chest wall soft tissues are unremarkable. No acute bony abnormality. Review of the MIP images confirms the above findings. IMPRESSION: No evidence of pulmonary embolus. Minimal dependent atelectasis in the lower lobes. No acute cardiopulmonary disease. Electronically Signed   By: Franky Crease M.D.   On: 08/01/2023 19:17   DG Chest 2 View Result Date: 08/01/2023 CLINICAL DATA:  Chest pain. EXAM: CHEST - 2 VIEW COMPARISON:  08/30/2008. FINDINGS: The heart size and mediastinal contours are within normal limits. No focal consolidation, pleural effusion,  or pneumothorax. No acute osseous abnormality. IMPRESSION: No acute cardiopulmonary findings. Electronically Signed   By: Harrietta Sherry M.D.   On: 08/01/2023 14:06    Microbiology: Recent Results (from the past 240 hours)  Blood Culture (routine x 2)     Status: None (Preliminary result)   Collection Time: 08/01/23  4:20 PM   Specimen: BLOOD  Result Value Ref Range Status   Specimen Description BLOOD LEFT ANTECUBITAL  Final   Special Requests   Final    BOTTLES DRAWN AEROBIC AND ANAEROBIC Blood Culture adequate volume   Culture   Final    NO GROWTH 2 DAYS Performed at [facility], [address] [place], [place] [address]    Report Status PENDING  Incomplete  Blood Culture (routine x 2)     Status: Abnormal   Collection Time: 08/01/23  4:50 PM   Specimen: BLOOD RIGHT FOREARM  Result Value Ref Range Status   Specimen Description   Final    BLOOD RIGHT FOREARM Performed at [facility], [address], [place], [place] [address]    Special Requests   Final    BOTTLES DRAWN AEROBIC AND ANAEROBIC Blood Culture results may not be optimal due to an inadequate volume of blood received in culture bottles Performed at [facility], [address] [place], [place] [address]    Culture  Setup Time GRAM NEGATIVE RODS AEROBIC BOTTLE ONLY   Final   Culture ESCHERICHIA COLI (A)  Final   Report Status 08/04/2023 FINAL  Final   Organism ID, Bacteria ESCHERICHIA COLI  Final   Organism ID, Bacteria ESCHERICHIA COLI  Final      Susceptibility   Escherichia coli - KIRBY BAUER*    CEFAZOLIN  INTERMEDIATE Intermediate    Escherichia coli - MIC*    AMPICILLIN >=32 RESISTANT Resistant     CEFEPIME  <=0.12 SENSITIVE Sensitive     CEFTAZIDIME <=1 SENSITIVE Sensitive     CEFTRIAXONE  <=0.25 SENSITIVE Sensitive     CIPROFLOXACIN  <=0.25 SENSITIVE Sensitive     GENTAMICIN <=1 SENSITIVE Sensitive     IMIPENEM <=0.25 SENSITIVE Sensitive     TRIMETH/SULFA <=20 SENSITIVE Sensitive     AMPICILLIN/SULBACTAM 16 INTERMEDIATE Intermediate     PIP/TAZO <=4 SENSITIVE Sensitive ug/mL    * ESCHERICHIA COLI    ESCHERICHIA COLI  Blood Culture ID Panel (Reflexed)     Status: Abnormal   Collection Time: 08/01/23  4:50 PM  Result Value Ref Range Status   Enterococcus faecalis NOT DETECTED NOT DETECTED Final   Enterococcus Faecium NOT DETECTED NOT DETECTED Final   Listeria monocytogenes NOT DETECTED NOT DETECTED Final   Staphylococcus species NOT DETECTED NOT DETECTED Final   Staphylococcus aureus (BCID) NOT DETECTED NOT DETECTED Final   Staphylococcus epidermidis NOT DETECTED NOT DETECTED Final   Staphylococcus lugdunensis NOT DETECTED NOT DETECTED Final   Streptococcus species NOT DETECTED NOT DETECTED Final   Streptococcus agalactiae NOT DETECTED NOT DETECTED Final   Streptococcus pneumoniae NOT DETECTED NOT DETECTED Final   Streptococcus pyogenes NOT DETECTED NOT DETECTED Final   A.calcoaceticus-baumannii  NOT DETECTED NOT DETECTED Final   Bacteroides fragilis NOT DETECTED NOT DETECTED Final   Enterobacterales DETECTED (A) NOT DETECTED Final    Comment: Enterobacterales represent a large order of gram negative bacteria, not a single organism.   Enterobacter cloacae complex NOT DETECTED NOT DETECTED Final   Escherichia coli DETECTED (A) NOT DETECTED Final    Comment: CRITICAL RESULT CALLED TO, READ BACK BY  AND VERIFIED WITH: ELVIRA MADUEME AT 1434  08/02/23 RAM    Klebsiella aerogenes NOT DETECTED NOT DETECTED Final   Klebsiella oxytoca NOT DETECTED NOT DETECTED Final   Klebsiella pneumoniae NOT DETECTED NOT DETECTED Final   Proteus species NOT DETECTED NOT DETECTED Final   Salmonella species NOT DETECTED NOT DETECTED Final   Serratia marcescens NOT DETECTED NOT DETECTED Final   Haemophilus influenzae NOT DETECTED NOT DETECTED Final   Neisseria meningitidis NOT DETECTED NOT DETECTED Final   Pseudomonas aeruginosa NOT DETECTED NOT DETECTED Final   Stenotrophomonas maltophilia NOT DETECTED NOT DETECTED Final   Candida albicans NOT DETECTED NOT DETECTED Final   Candida auris NOT DETECTED NOT DETECTED Final   Candida glabrata NOT DETECTED NOT DETECTED Final   Candida krusei NOT DETECTED NOT DETECTED Final   Candida parapsilosis NOT DETECTED NOT DETECTED Final   Candida tropicalis NOT DETECTED NOT DETECTED Final   Cryptococcus neoformans/gattii NOT DETECTED NOT DETECTED Final   CTX-M ESBL NOT DETECTED NOT DETECTED Final   Carbapenem resistance IMP NOT DETECTED NOT DETECTED Final   Carbapenem resistance KPC NOT DETECTED NOT DETECTED Final   Carbapenem resistance NDM NOT DETECTED NOT DETECTED Final   Carbapenem resist OXA 48 LIKE NOT DETECTED NOT DETECTED Final   Carbapenem resistance VIM NOT DETECTED NOT DETECTED Final    Comment: Performed at [facility], [address] [place], [place] [address]  Resp panel by RT-PCR (RSV, Flu A&B, Covid) Anterior Nasal Swab     Status: None    Collection Time: 08/01/23 10:36 PM   Specimen: Anterior Nasal Swab  Result Value Ref Range Status   SARS Coronavirus 2 by RT PCR NEGATIVE NEGATIVE Final    Comment: (NOTE) SARS-CoV-2 target nucleic acids are NOT DETECTED.  The SARS-CoV-2 RNA is generally detectable in upper respiratory specimens during the acute phase of infection. The lowest concentration of SARS-CoV-2 viral copies this assay can detect is 138 copies/mL. A negative result does not preclude SARS-Cov-2 infection and should not be used as the sole basis for treatment or other patient management decisions. A negative result may occur with  improper specimen collection/handling, submission of specimen other than nasopharyngeal swab, presence of viral mutation(s) within the areas targeted by this assay, and inadequate number of viral copies(<138 copies/mL). A negative result must be combined with clinical observations, patient history, and epidemiological information. The expected result is Negative.  Fact Sheet for Patients:  BloggerCourse.com  Fact Sheet for Healthcare Providers:  SeriousBroker.it  This test is no t yet approved or cleared by the [place]  FDA and  has been authorized for detection and/or diagnosis of SARS-CoV-2 by FDA under an Emergency Use Authorization (EUA). This EUA will remain  in effect (meaning this test can be used) for the duration of the COVID-19 declaration under Section 564(b)(1) of the Act, 21 U.S.C.section 360bbb-3(b)(1), unless the authorization is terminated  or revoked sooner.       Influenza A by PCR NEGATIVE NEGATIVE Final   Influenza B by PCR NEGATIVE NEGATIVE Final    Comment: (NOTE) The Xpert Xpress SARS-CoV-2/FLU/RSV plus assay is intended as an aid in the diagnosis of influenza from Nasopharyngeal swab specimens and should not be used as a sole basis for treatment. Nasal washings and aspirates are unacceptable for  Xpert Xpress SARS-CoV-2/FLU/RSV testing.  Fact Sheet for Patients: BloggerCourse.com  Fact Sheet for Healthcare Providers: SeriousBroker.it  This test is not yet approved or cleared by the [place]  FDA and has been authorized for  detection and/or diagnosis of SARS-CoV-2 by FDA under an Emergency Use Authorization (EUA). This EUA will remain in effect (meaning this test can be used) for the duration of the COVID-19 declaration under Section 564(b)(1) of the Act, 21 U.S.C. section 360bbb-3(b)(1), unless the authorization is terminated or revoked.     Resp Syncytial Virus by PCR NEGATIVE NEGATIVE Final    Comment: (NOTE) Fact Sheet for Patients: BloggerCourse.com  Fact Sheet for Healthcare Providers: SeriousBroker.it  This test is not yet approved or cleared by the [place]  FDA and has been authorized for detection and/or diagnosis of SARS-CoV-2 by FDA under an Emergency Use Authorization (EUA). This EUA will remain in effect (meaning this test can be used) for the duration of the COVID-19 declaration under Section 564(b)(1) of the Act, 21 U.S.C. section 360bbb-3(b)(1), unless the authorization is terminated or revoked.  Performed at [facility], [address] [place], [place] [address]      Labs: CBC: Recent Labs  Lab 08/01/23 1347 08/02/23 0606 08/03/23 0512 08/04/23 0501  WBC 14.8* 12.8* 9.5 9.5  NEUTROABS 11.9*  --   --   --   HGB 12.6 10.0* 9.5* 10.9*  HCT 36.6 30.1* 28.0* 32.3*  MCV 92.2 94.4 90.9 92.3  PLT 241 173 174 268   Basic Metabolic Panel: Recent Labs  Lab 08/01/23 1347 08/01/23 2236 08/02/23 0606 08/02/23 1457 08/03/23 0512 08/04/23 0501  NA 137  --  140 141 141 141  K 3.1*  --  3.6 3.8 3.5 3.5  CL 105  --  115* 109 111 109  CO2 20*  --  18* 20* 22 22  GLUCOSE 125*  --  109* 120* 103* 104*  BUN 11  --  9 8 7* 6*   CREATININE 0.99  --  0.84 0.82 0.74 0.82  CALCIUM  9.6  --  8.2* 8.9 8.1* 8.9  MG  --  1.5* 1.9 1.9 1.8 2.0  PHOS  --   --  1.7* 1.2* 2.9 2.5   Liver Function Tests: Recent Labs  Lab 08/01/23 1347  AST 22  ALT 18  ALKPHOS 75  BILITOT 0.5  PROT 7.0  ALBUMIN 3.6   No results for input(s): LIPASE, AMYLASE in the last 168 hours. No results for input(s): AMMONIA in the last 168 hours. Cardiac Enzymes: No results for input(s): CKTOTAL, CKMB, CKMBINDEX, TROPONINI in the last 168 hours. BNP (last 3 results) No results for input(s): BNP in the last 8760 hours. CBG: No results for input(s): GLUCAP in the last 168 hours.  Time spent: 35 minutes  Signed:  Elvan Sor  [facility] 08/04/2023 11:09 AM

## 2023-08-04 NOTE — Plan of Care (Signed)

## 2023-08-06 LAB — CULTURE, BLOOD (ROUTINE X 2)
Culture: NO GROWTH
Special Requests: ADEQUATE

## 2023-08-09 ENCOUNTER — Ambulatory Visit (INDEPENDENT_AMBULATORY_CARE_PROVIDER_SITE_OTHER): Admitting: Family Medicine

## 2023-08-09 ENCOUNTER — Encounter: Payer: Self-pay | Admitting: Family Medicine

## 2023-08-09 VITALS — BP 136/80 | HR 79 | Ht 63.0 in | Wt 119.0 lb

## 2023-08-09 DIAGNOSIS — M542 Cervicalgia: Secondary | ICD-10-CM

## 2023-08-09 DIAGNOSIS — R7881 Bacteremia: Secondary | ICD-10-CM | POA: Diagnosis not present

## 2023-08-09 DIAGNOSIS — B9620 Unspecified Escherichia coli [E. coli] as the cause of diseases classified elsewhere: Secondary | ICD-10-CM | POA: Diagnosis not present

## 2023-08-09 DIAGNOSIS — D508 Other iron deficiency anemias: Secondary | ICD-10-CM

## 2023-08-09 NOTE — Progress Notes (Signed)
 Established patient visit   Patient: Shari Rivera   DOB: 07-Jan-1956   68 y.o. Female  MRN: [id number] Visit Date: 08/09/2023  Today's healthcare provider: Rockie Agent, MD   Chief Complaint  Patient presents with   Hospitalization Follow-up    intractable nausea and vomiting, she was discharged last Friday and feeling better    Subjective     HPI     Hospitalization Follow-up    Additional comments: intractable nausea and vomiting, she was discharged last Friday and feeling better       Last edited by Thelbert Eulalio HERO, CMA on 08/09/2023  2:16 PM.       Discussed the use of AI scribe software for clinical note transcription with the patient, who gave verbal consent to proceed.  History of Present Illness Shari Rivera is a 68 year old female who presents for hospital follow-up for nausea and vomiting after being treated for E. coli bacteremia.  She was hospitalized for E. coli bacteremia, which resulted in severe nausea, vomiting, and dehydration. She felt critically ill upon admission. During her hospital stay, she was treated with vancomycin , cefepime , Flagyl , and ceftriaxone , and was discharged on ciprofloxacin  and probiotics. She is currently taking ciprofloxacin  twice daily with approximately four doses remaining and continues to take probiotics.  She was prescribed oral iron  after being told she was low on iron , with a hemoglobin level of 9.5 g/dL. Since starting the iron , she has been experiencing thick, black stools, which she attributes to the medication. She also has a history of low potassium levels, causing severe cramping.  She experiences chronic neck pain that significantly impacts her daily life, including her ability to sleep and drive. She was advised to stop taking naproxen and switch to Tylenol , but finds Tylenol  ineffective and is currently taking ibuprofen instead. She has a pending neurology appointment with Dr. Penne Sharps for further evaluation of her neck pain and potential carpal tunnel surgery.  She recently had a colonoscopy, which showed no polyps or lesions but revealed mild diverticulitis. She reports occasional nausea and diarrhea, described as 'sludge-like', which she attributes to the iron  supplementation.  Her family history is significant for diabetes, and she wants to have her A1c and cholesterol levels checked during her next visit. She is concerned about the possibility of the E. coli infection originating from a urinary tract infection, as suggested by her sister, who is a Research scientist (medical). Admitted 08/02/23-08/04/23 for sepsis 2/2 to E coli bacteremia that was treated with vancomycin , Cefepime , flagyl  and CTX as well as PPI and IV fluids Discharged on Ciprofloxacin  500mg  BID for 10 days along with probiotics   Started on oral iron  and vitamin C  with plan to repeat iron  studies in 3 months   Med review -stopped naproxen and terbinafine       Past Medical History:  Diagnosis Date   Allergy    Anemia    Anxiety    Arthritis    Cancer (HCC)    Carpal tunnel syndrome, bilateral    Cataract    COVID-19    Depression    Dyspnea    former smoker   Endometriosis    Family history of adverse reaction to anesthesia    Mother difficult to wake up   GERD (gastroesophageal reflux disease)    Pneumonia    Pre-diabetes     Medications: Outpatient Medications Prior to Visit  Medication Sig   acetaminophen  (TYLENOL ) 325 MG tablet Take 2  tablets (650 mg total) by mouth every 6 (six) hours as needed for mild pain (pain score 1-3), fever, headache or moderate pain (pain score 4-6) (or Fever >/= 101).   alendronate  (FOSAMAX ) 70 MG tablet TAKE 1 TABLET EVERY 7 DAYS WITH A FULL GLASS OF WATER ON AN EMPTY STOMACH DO NOT LIE DOWN FOR AT LEAST 30 MIN   ascorbic acid  (VITAMIN C ) 500 MG tablet Take 1 tablet (500 mg total) by mouth daily.   Calcium  Carbonate-Vitamin D  600-400 MG-UNIT tablet Take 1 tablet  by mouth daily.   ciprofloxacin  (CIPRO ) 500 MG tablet Take 1 tablet (500 mg total) by mouth 2 (two) times daily for 10 days.   clonazePAM  (KLONOPIN ) 0.5 MG tablet Take 1 tablet (0.5 mg total) by mouth at bedtime as needed for anxiety.   Coenzyme Q10 (COQ-10) 200 MG CAPS Take 200 mg by mouth at bedtime.   famotidine  (PEPCID ) 40 MG tablet TAKE ONE TABLET BY MOUTH AT BEDTIME   fluticasone  (FLONASE ) 50 MCG/ACT nasal spray Place 2 sprays into both nostrils daily.   iron  polysaccharides (NIFEREX) 150 MG capsule Take 1 capsule (150 mg total) by mouth daily.   Multiple Vitamin (MULTIVITAMIN WITH MINERALS) TABS tablet Take 1 tablet by mouth daily.   Naproxen Sod-diphenhydrAMINE (ALEVE PM) 220-25 MG TABS Take 2 capsules by mouth at bedtime. (Patient not taking: Reported on 08/09/2023)   pantoprazole  (PROTONIX ) 40 MG tablet Take 1 tablet (40 mg total) by mouth daily for 14 days.   rosuvastatin  (CRESTOR ) 20 MG tablet Take 1 tablet (20 mg total) by mouth daily.   saccharomyces boulardii (FLORASTOR) 250 MG capsule Take 1 capsule (250 mg total) by mouth 2 (two) times daily for 10 days.   sertraline  (ZOLOFT ) 100 MG tablet Take 1.5 tablets (150 mg total) by mouth daily.   No facility-administered medications prior to visit.    Review of Systems  Last CBC Lab Results  Component Value Date   WBC 9.5 08/04/2023   HGB 10.9 (L) 08/04/2023   HCT 32.3 (L) 08/04/2023   MCV 92.3 08/04/2023   MCH 31.1 08/04/2023   RDW 13.0 08/04/2023   PLT 268 08/04/2023   Last metabolic panel Lab Results  Component Value Date   GLUCOSE 104 (H) 08/04/2023   NA 141 08/04/2023   K 3.5 08/04/2023   CL 109 08/04/2023   CO2 22 08/04/2023   BUN 6 (L) 08/04/2023   CREATININE 0.82 08/04/2023   GFRNONAA >60 08/04/2023   CALCIUM  8.9 08/04/2023   PHOS 2.5 08/04/2023   PROT 7.0 08/01/2023   ALBUMIN 3.6 08/01/2023   LABGLOB 2.3 06/10/2022   AGRATIO 1.7 06/10/2022   BILITOT 0.5 08/01/2023   ALKPHOS 75 08/01/2023   AST 22  08/01/2023   ALT 18 08/01/2023   ANIONGAP 10 08/04/2023   Last lipids Lab Results  Component Value Date   CHOL 133 02/07/2023   HDL 44 02/07/2023   LDLCALC 67 02/07/2023   TRIG 121 02/07/2023   CHOLHDL 3.0 02/07/2023   Last hemoglobin A1c Lab Results  Component Value Date   HGBA1C 5.7 (H) 06/10/2022   Last thyroid  functions Lab Results  Component Value Date   TSH 3.270 06/10/2022   Last vitamin D  Lab Results  Component Value Date   VD25OH 34.99 08/02/2023        Objective    BP 136/80   Pulse 79   Ht 5' 3 (1.6 m)   Wt 119 lb (54 kg)   SpO2 99%  BMI 21.08 kg/m   BP Readings from Last 3 Encounters:  08/09/23 136/80  08/04/23 123/71  06/28/23 122/84   Wt Readings from Last 3 Encounters:  08/09/23 119 lb (54 kg)  08/01/23 116 lb 13.5 oz (53 kg)  06/28/23 117 lb (53.1 kg)        Physical Exam Vitals reviewed.  Constitutional:      General: She is not in acute distress.    Appearance: Normal appearance. She is not ill-appearing.  Cardiovascular:     Rate and Rhythm: Normal rate and regular rhythm.  Pulmonary:     Effort: Pulmonary effort is normal. No respiratory distress.     Breath sounds: No wheezing, rhonchi or rales.  Neurological:     Mental Status: She is alert and oriented to person, place, and time.  Psychiatric:        Mood and Affect: Mood normal.        Behavior: Behavior normal.     Physical Exam      No results found for any visits on 08/09/23.  Assessment & Plan     Problem List Items Addressed This Visit   None   Assessment & Plan E. coli bacteremia Hospitalized for E. coli bacteremia, resulting in severe nausea, vomiting, and dehydration. Critical condition upon admission. Source of bacteremia unclear, suspected UTI origin. Treated with vancomycin , cefepime , Flagyl , and ceftriaxone  during hospitalization. Discharged on ciprofloxacin  and probiotics. Currently completing ciprofloxacin  course and feeling better. -  Complete ciprofloxacin  course - Continue probiotics as tolerated - Encourage fluid intake to prevent dehydration  Iron  deficiency anemia Iron  deficiency anemia with hemoglobin level of 9.5 g/dL, likely due to poor nutritional intake secondary to recent illness and dehydration. Currently on oral iron  supplementation. Oral iron  chosen over IV due to infection risk. Target hemoglobin above 11.5 g/dL. - Continue oral iron  supplementation - Recheck iron  levels in October   Chronic neck pain Chronic neck pain worsening, affecting sleep and daily activities. Advised to stop naproxen due to potential adverse effects, currently taking ibuprofen for pain management. Pending neurosurgery appointment with Dr. Penne Sharps for further evaluation and management. Unable to sleep without pain relief, impacting daily function. - Reschedule neurosurgery appointment with Dr. Penne Sharps - Avoid naproxen and use ibuprofen cautiously  General Health Maintenance Due for annual wellness check. Expressed interest in monitoring A1c and cholesterol levels due to family history of diabetes. Plans to combine August and October appointments to reduce trips. - Schedule annual wellness check in October - Order A1c and cholesterol tests in October     No follow-ups on file.         Rockie Agent, MD  [facility] [phone] (phone) [phone] (fax)  [facility]

## 2023-08-17 ENCOUNTER — Encounter: Payer: Self-pay | Admitting: Gastroenterology

## 2023-08-29 ENCOUNTER — Ambulatory Visit: Payer: Self-pay | Admitting: Neurosurgery

## 2023-08-29 ENCOUNTER — Encounter: Payer: Self-pay | Admitting: Neurosurgery

## 2023-08-29 ENCOUNTER — Ambulatory Visit (INDEPENDENT_AMBULATORY_CARE_PROVIDER_SITE_OTHER): Admitting: Neurosurgery

## 2023-08-29 VITALS — BP 104/62 | Ht 63.0 in | Wt 119.0 lb

## 2023-08-29 DIAGNOSIS — M4802 Spinal stenosis, cervical region: Secondary | ICD-10-CM | POA: Diagnosis not present

## 2023-08-29 DIAGNOSIS — M47812 Spondylosis without myelopathy or radiculopathy, cervical region: Secondary | ICD-10-CM | POA: Diagnosis not present

## 2023-08-29 DIAGNOSIS — M810 Age-related osteoporosis without current pathological fracture: Secondary | ICD-10-CM

## 2023-08-29 DIAGNOSIS — M542 Cervicalgia: Secondary | ICD-10-CM

## 2023-08-29 DIAGNOSIS — R292 Abnormal reflex: Secondary | ICD-10-CM

## 2023-08-29 NOTE — Progress Notes (Unsigned)
 REFERRING PHYSICIAN:  Sharma Coyer, Md [address] [place],  [place] [address]  DOS: 11/30/22 left sided carpal tunnel decompression with ultrasound guidance   HISTORY OF PRESENT ILLNESS: Shari Rivera is a patient who is well-known to me.  She has previous ultrasound-guided carpal tunnel surgery.  She is here today to discuss her ongoing neck pain.  She has had neck pain for many years approximately 5 to 6 years.  Previously had physical therapy but was discharged.  Continues to have neck pain that radiates into her bilateral shoulders and arms.  She is mostly had conservative care over the past 5 years with lifestyle modification, home exercises, and oral analgesics.  She recently had a DEXA scan which demonstrated severe osteoporosis.  She has not had recent facet injections  PHYSICAL EXAMINATION:  NEUROLOGICAL:  General: In no acute distress.   Awake, alert, oriented to person, place, and time.  Pupils equal round and reactive to light.  Facial tone is symmetric.    Strength: Continues to have at least 4+ out of 5 in median intrinsic hand muscles, no major deficits noted in the upper extremities otherwise.  Incision c/d/i  Imaging:  Narrative & Impression  CLINICAL DATA:  Neck pain, degenerative changes   EXAM: CERVICAL SPINE - COMPLETE 4+ VIEW   COMPARISON:  03/25/2021   FINDINGS: Similar moderately severe degenerative disc disease at multiple levels most pronounced at C5-6 and C6-7 where there is marked disc space narrowing, sclerosis and endplate osteophytes. Stable alignment and prevertebral soft tissues. Mild multilevel posterior facet arthropathy. No acute osseous finding or fracture. Minimal grade 1 anterolisthesis of C3 on C4 and C4 on C5 with flexion and resolves with extension.   IMPRESSION: 1. Multilevel cervical spondylosis as above. 2. Minimal grade 1 anterolisthesis of C3 on C4 and C4 on C5 with flexion and resolves with  extension.     Electronically Signed   By: CHRISTELLA.  Shick M.D.   On: 11/23/2022 16:54     Narrative & Impression  CLINICAL DATA:  Chronic neck pain bilaterally. Popping. Bilateral upper extremity tingling.   EXAM: MRI CERVICAL SPINE WITHOUT CONTRAST   TECHNIQUE: Multiplanar, multisequence MR imaging of the cervical spine was performed. No intravenous contrast was administered.   COMPARISON:  Cervical spine radiographs 03/25/2021   FINDINGS: The study is motion degraded throughout, including severe motion on axial sequences despite attempts at repeat imaging.   Alignment: Trace anterolisthesis of C3 on C4.   Vertebrae: No fracture or suspicious marrow lesion. Severe disc space narrowing at C5-6 and C6-7 with predominantly chronic degenerative endplate changes.   Cord: No gross spinal cord signal abnormality is identified, however evaluation is significantly limited by the degree of motion artifact.   Posterior Fossa, vertebral arteries, paraspinal tissues: Unremarkable.   Disc levels:   C2-3: Mild uncovertebral spurring without sizable disc herniation or stenosis.   C3-4: Anterolisthesis with bulging uncovered disc, a large left foraminal disc protrusion, and mild-to-moderate right and severe left facet arthrosis result in severe left neural foraminal stenosis and likely left C4 nerve root impingement. No significant spinal or right neural foraminal stenosis.   C4-5: Mild uncovertebral spurring and mild-to-moderate facet arthrosis without compressive stenosis.   C5-6: A broad-based posterior disc osteophyte complex and mild facet arthrosis result in mild-to-moderate spinal stenosis and moderate to severe right and severe left neural foraminal stenosis.   C6-7: A broad-based posterior disc osteophyte complex results in mild spinal stenosis and severe right and moderate left neural  foraminal stenosis.   C7-T1: No compressive stenosis.   IMPRESSION: 1. Severely  motion degraded examination. 2. Large left foraminal disc protrusion at C3-4 with severe left neural foraminal stenosis. 3. Mild-to-moderate spinal stenosis and moderate to severe bilateral neural foraminal stenosis at C5-6. 4. Mild spinal stenosis and severe right and moderate left neural foraminal stenosis at C6-7.     Electronically Signed   By: Dasie Hamburg M.D.   On: 09/25/2022 12:07    Assessment / Plan: Shari Rivera is doing well s/p above surgery..  She is here today to discuss a new problem or an ongoing problem including her cervical spine pain.  She gets some evidence of cervical radiculopathy without myelopathy.  She has been treated conservatively over the past 5 to 6 years with a history of physical therapy, lifestyle modification, oral analgesics.  She is here today to discuss her spine imaging.  It demonstrates multilevel cervical spondylosis with foraminal stenosis.  Also demonstrates some mild motion at her flexion segments.  She does have mechanical neck pain.  Has a misalignment.  On physical examination she does not have any myelopathy but does have some signs of radiculopathy with decreased reflexes and index musculature/myotomes.  She has not had any injections, would like to try to avoid surgery if possible but we will plan to have her referred for pain management for cervical facet injections.  I had also like to have her referred to the bone optimization team, referral has been placed.  I like to see her back in a few months after to see how she is doing with conservative management.  She may necessitate a spinal fusion but would like to optimize her prior to this.  Advised to contact the office if any questions or concerns arise.   Penne Sharps, MD Dept of Neurosurgery   Spent a total of 30 minutes on her care today including record review, imaging review, face-to-face evaluation, and coordination of her care going forward.

## 2023-08-31 ENCOUNTER — Ambulatory Visit
Admission: RE | Admit: 2023-08-31 | Discharge: 2023-08-31 | Disposition: A | Discharge status: Home / Self Care | Source: Ambulatory Visit | Attending: Family Medicine | Admitting: Family Medicine

## 2023-08-31 DIAGNOSIS — Z1231 Encounter for screening mammogram for malignant neoplasm of breast: Secondary | ICD-10-CM | POA: Diagnosis present

## 2023-09-05 ENCOUNTER — Ambulatory Visit: Payer: Self-pay | Admitting: Family Medicine

## 2023-09-06 ENCOUNTER — Other Ambulatory Visit: Payer: Self-pay | Admitting: Family Medicine

## 2023-09-06 DIAGNOSIS — K219 Gastro-esophageal reflux disease without esophagitis: Secondary | ICD-10-CM

## 2023-09-06 DIAGNOSIS — F3341 Major depressive disorder, recurrent, in partial remission: Secondary | ICD-10-CM

## 2023-09-08 ENCOUNTER — Ambulatory Visit: Admitting: Family Medicine

## 2023-09-09 ENCOUNTER — Other Ambulatory Visit (HOSPITAL_COMMUNITY): Payer: Self-pay

## 2023-09-15 IMAGING — MG MM DIGITAL SCREENING BILAT W/ TOMO AND CAD
8 series · 8 of 24 positions shown · non-contrast
Comparison: None.

CLINICAL DATA: Screening.

EXAM:
DIGITAL SCREENING BILATERAL MAMMOGRAM WITH TOMOSYNTHESIS AND CAD
TECHNIQUE: Bilateral screening digital craniocaudal and mediolateral oblique
mammograms were obtained. Bilateral screening digital breast
tomosynthesis was performed. The images were evaluated with
computer-aided detection.

[L CC synth-2D]
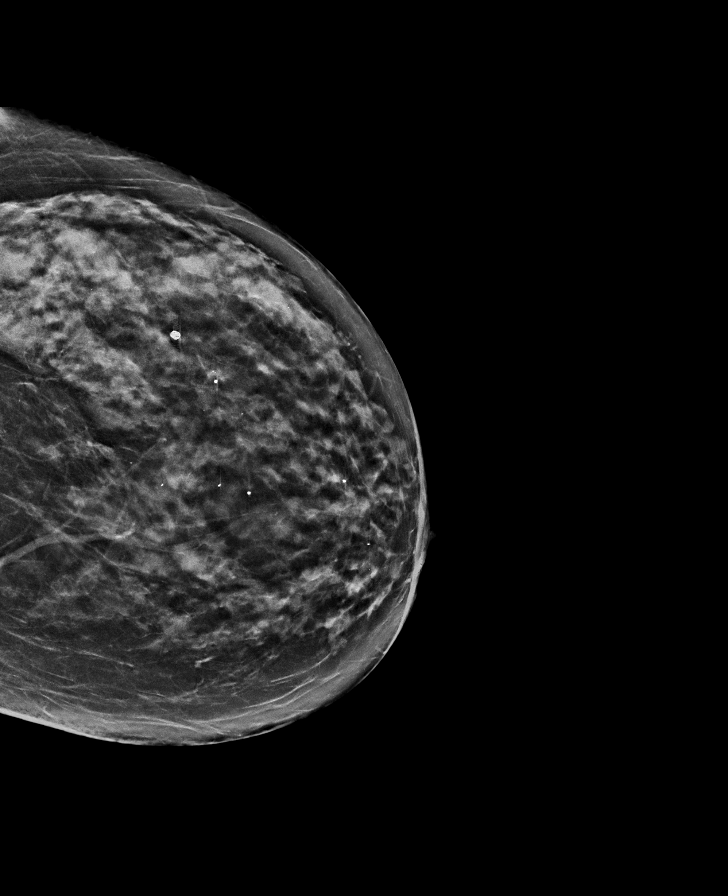

[R MLO synth-2D]
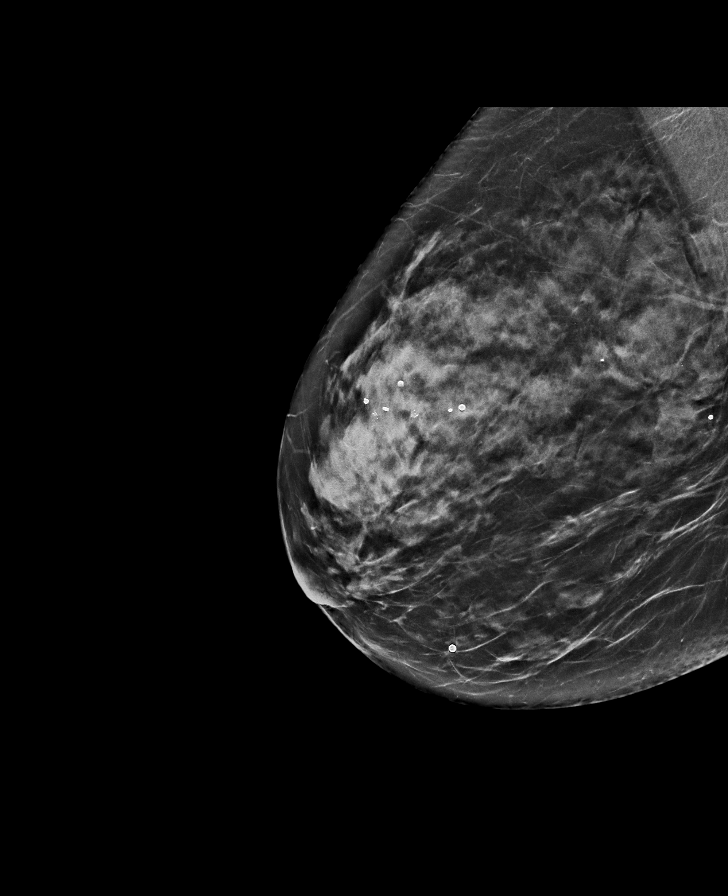

[R CC synth-2D]
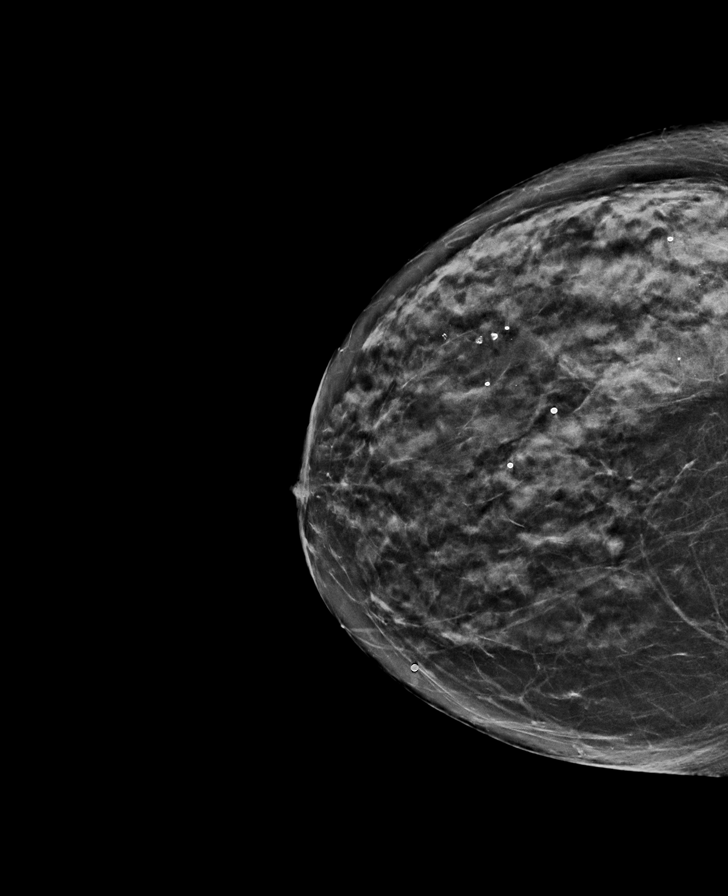

[L MLO synth-2D]
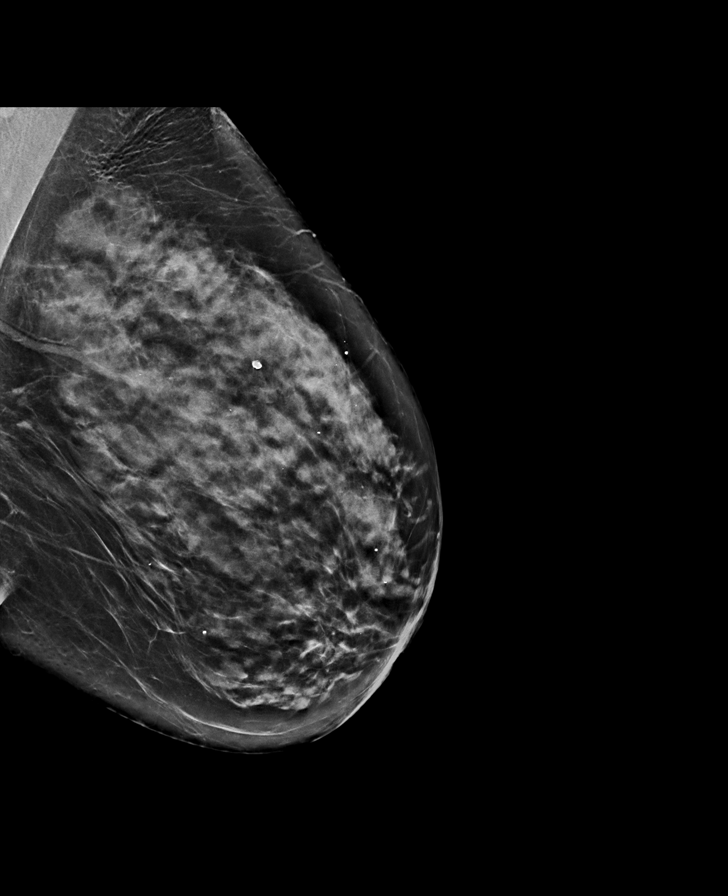

[L CC tomo · tomo slice 31/62.0]
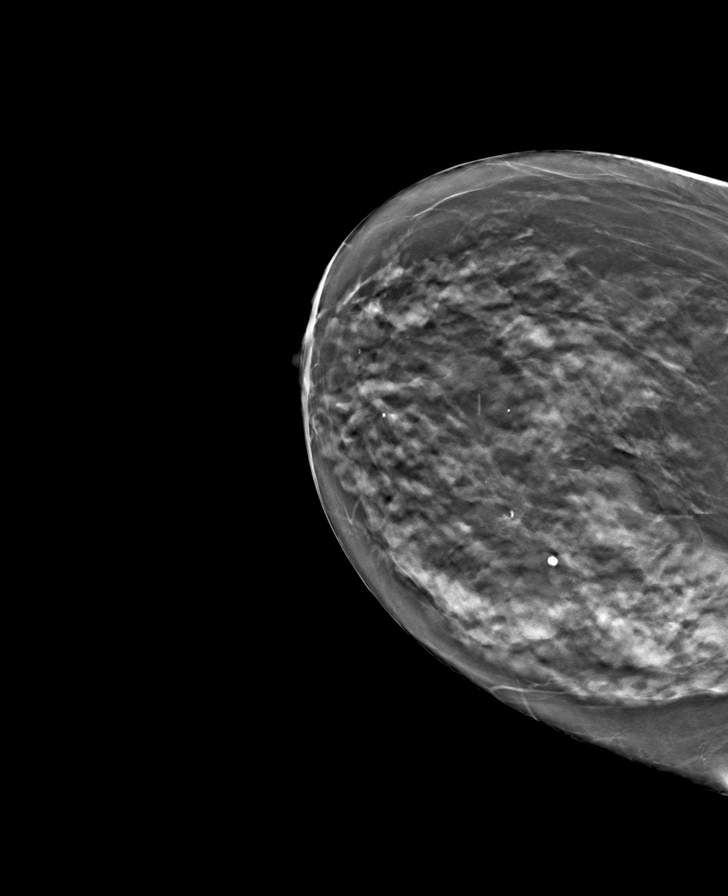

[L MLO tomo · tomo slice 36/71.0]
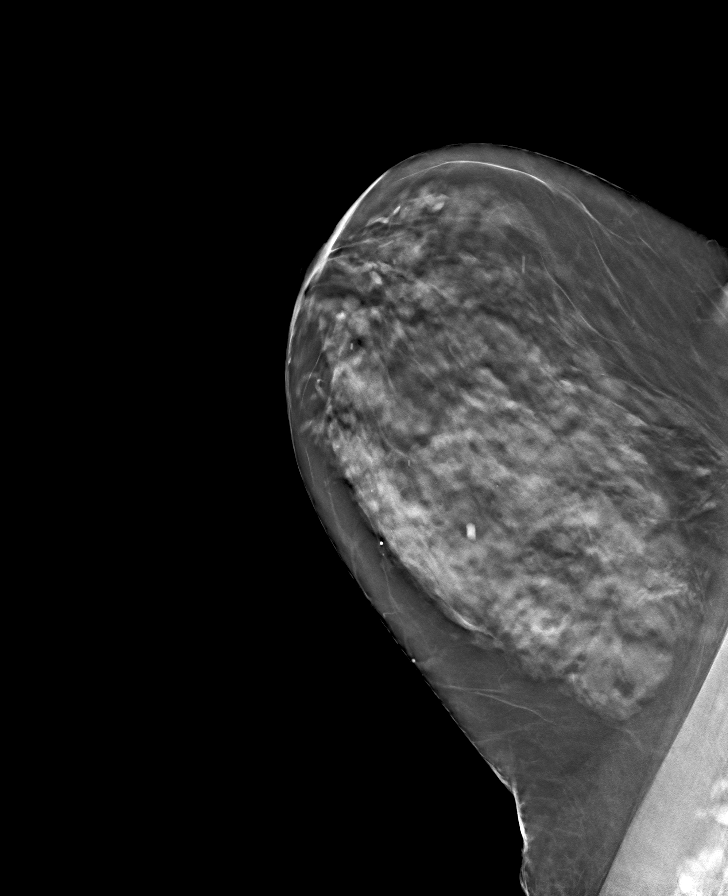

[R CC tomo · tomo slice 30/59.0]
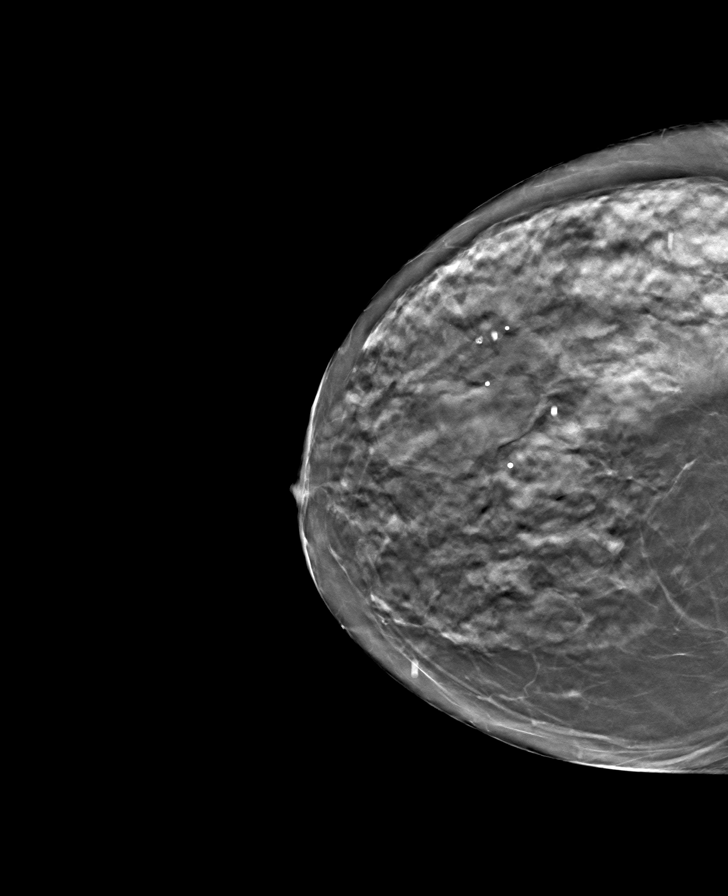

[R MLO tomo · tomo slice 31/61.0]
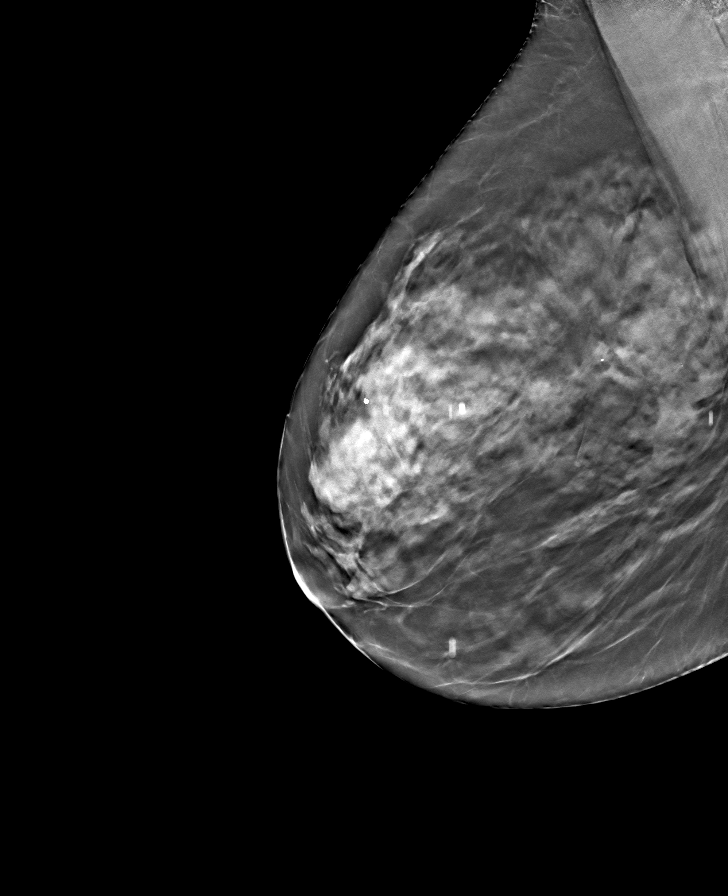

[8 of 24 positions shown; findings below may reference images not displayed]

ACR Breast Density Category c: The breast tissue is heterogeneously
dense, which may obscure small masses
FINDINGS: There are no findings suspicious for malignancy.
IMPRESSION: No mammographic evidence of malignancy. A result letter of this
screening mammogram will be mailed directly to the patient.

RECOMMENDATION:
Screening mammogram in one year. (Code:C8-T-HNK)

BI-RADS CATEGORY  1: Negative.

## 2023-10-26 ENCOUNTER — Ambulatory Visit (INDEPENDENT_AMBULATORY_CARE_PROVIDER_SITE_OTHER): Admitting: Physician Assistant

## 2023-10-26 ENCOUNTER — Encounter: Payer: Self-pay | Admitting: Physician Assistant

## 2023-10-26 VITALS — Ht 62.0 in | Wt 118.0 lb

## 2023-10-26 DIAGNOSIS — M810 Age-related osteoporosis without current pathological fracture: Secondary | ICD-10-CM

## 2023-10-26 NOTE — Addendum Note (Signed)
 Addended by: RODGERS LACY on: 10/26/2023 04:13 PM   Modules accepted: Orders

## 2023-10-26 NOTE — Progress Notes (Signed)
 Office Visit Note   Patient: Shari Rivera           Date of Birth: 1956/01/15           MRN: [id number] Visit Date: 10/26/2023              Requested by: Claudene Penne ORN, MD [address] [place],  [place] [address] PCP: Sharma Coyer, MD   Assessment & Plan: Visit Diagnoses:  1. Age-related osteoporosis without current pathological fracture     Plan: Patient is a pleasant 68 year old woman who is referred by Dr. Claudene her neurosurgeon.  She is here for evaluation of severe osteoporosis.  She has been taking Fosamax  for several years but actually has had a decline in her bone density 2-3.  Fosamax  has not been effective and she also has difficulty with it because she has a history of severe reflux.  She has never had a fracture per se.  No history of cardiac disease no history of cancer no history of kidney disease peptic ulcer she does have a history of.  No history of gastric bypass she does have a history of reflux.  No history of seizures she went through menopause when she was 50 did not do hormone replacement therapy she does take calcium  600 mg and has vitamin D  and her calcium .  Her recent lab work demonstrated a normal vitamin D  and normal calcium .  She is a former smoker and was smoking 1-1/2 packs a day for 30 years.  She does not drink.  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  She has been pending cervical surgery for severe cervical radiculopathy and Dr. Claudene is very concerned about her bone density and would like this to be optimized prior to surgery.  In the meantime she is having to get injections to help with her pain.  Her last bone density was -3.1 based in 2024.  She does have a family history of osteoporosis and fractures in both her mom and her grandmom.  FRAX score was performed today she has a 42% risk of a osteoporotic fracture in the next 10 years and a 17% chance of an osteoporotic fracture in the next 10 years affecting  her hip.  This is significantly above the norm of 3% for hip fracture and 20% for osteoporotic fracture.  I had a long discussion with her regarding calcium  and exercise.  I did give her information on calcium  rich foods and I would like her to calculate her calcium  intake based on list I gave her and adding in her supplement.  She knows she needs to target to be between 12 and 1500 mg a day.  Her vitamin D  which is in her vitamin is adequate by labs.  Fosamax  has been unsuccessful.  She really needs an anabolic at this point.  I have recommended Evenity.  We will place for authorization.  I spent 45 minutes reviewing her chart and going over therapies.  We also discussed side effects of Fosamax  both common and uncommon.  She is obviously much higher risk for fracture.  Follow-Up Instructions: Return if symptoms worsen or fail to improve.   Orders:  No orders of the defined types were placed in this encounter.  No orders of the defined types were placed in this encounter.     Procedures: No procedures performed   Clinical Data: No additional findings.   Subjective: No chief complaint on file.   HPI pleasant  68 year old woman referred by Dr. Penne Sharps for evaluation of osteoporosis treatment.  She has severe cervical spondylosis and is anticipating surgery but unfortunately does not have optimal bone density at this time to undergo surgery.  She is having increasing pain in her neck and has a history of falls which may be related to this.  Review of Systems  All other systems reviewed and are negative.    Objective: Vital Signs: Ht 5' 2 (1.575 m)   Wt 118 lb (53.5 kg)   BMI 21.58 kg/m   Physical Exam Constitutional:      Appearance: Normal appearance.  Pulmonary:     Effort: Pulmonary effort is normal.  Skin:    General: Skin is warm and dry.  Neurological:     General: No focal deficit present.     Mental Status: She is alert and oriented to person, place, and time.   Psychiatric:        Mood and Affect: Mood normal.        Behavior: Behavior normal.       Specialty Comments:  No specialty comments available.  Imaging: No results found.   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/12/2022   Hyperlipidemia 02/12/2022   Age-related osteoporosis without current pathological fracture 02/11/2022   Past Medical History:  Diagnosis Date   Allergy    Anemia    Anxiety    Arthritis    Cancer (HCC)    Carpal tunnel syndrome, bilateral    Cataract    COVID-19    Depression    Dyspnea    former smoker   Endometriosis    Family history of adverse reaction to anesthesia    Mother difficult to wake up   GERD (gastroesophageal reflux disease)    Pneumonia    Pre-diabetes     Family History  Problem Relation Age of Onset   Cancer Mother    Stroke Mother    COPD Mother    Heart failure Mother    Atrial fibrillation Mother    Heart disease Father    Diabetes Sister    Diabetes Brother    Cancer Maternal Grandmother    Diabetes Maternal Grandmother    Kidney disease Maternal Grandfather    Heart disease Maternal Grandfather    Heart disease Paternal Grandfather    Breast cancer Other        mggm    Past Surgical History:  Procedure Laterality Date   APPENDECTOMY     CARPAL TUNNEL RELEASE Left 11/30/2022   Procedure: LEFT CARPAL TUNNEL RELEASE WITH ULTRASOUND  GUIDANCE;  Surgeon: Sharps Penne ORN, MD;  Location: [facility] ORS;  Service: Neurosurgery;  Laterality: Left;  LOCAL W/ MAC   COLONOSCOPY N/A 06/28/2023   Procedure: COLONOSCOPY;  Surgeon: Jinny Carmine, MD;  Location: [facility] ENDOSCOPY;  Service: Endoscopy;  Laterality: N/A;   DIAGNOSTIC LAPAROSCOPY     endometrosis   EYE SURGERY  cataract bilateral with lens replaced   NASAL SEPTUM SURGERY     SMALL INTESTINE SURGERY     Social History   Occupational History   Not on file  Tobacco Use   Smoking status: Former   Smokeless tobacco: Never  Vaping Use   Vaping status: Former  Substance and Sexual Activity   Alcohol use: No   Drug use: Yes    Types: Marijuana    Comment: occas   Sexual activity: Not Currently

## 2023-10-28 ENCOUNTER — Telehealth: Payer: Self-pay

## 2023-10-28 NOTE — Telephone Encounter (Signed)
 Copied from CRM #[id number]. Topic: General - Other >> Oct 27, 2023  1:38 PM Amy B wrote: Reason for CRM: Kyra with [employer] called stating the patient's care plan and health risk assessment is now available on their provider portal.

## 2023-10-31 ENCOUNTER — Telehealth: Payer: Self-pay

## 2023-10-31 LAB — TSH: TSH: 2.29 m[IU]/L (ref 0.40–4.50)

## 2023-10-31 LAB — PARATHYROID HORMONE, INTACT (NO CA)

## 2023-10-31 NOTE — Telephone Encounter (Signed)
 Called patient and LVM to see if she can come into the office tomorrow to have labs draw. And to let the front desk know she is here for labs only

## 2023-11-01 ENCOUNTER — Telehealth: Payer: Self-pay

## 2023-11-01 ENCOUNTER — Other Ambulatory Visit: Payer: Self-pay

## 2023-11-01 DIAGNOSIS — M810 Age-related osteoporosis without current pathological fracture: Secondary | ICD-10-CM

## 2023-11-01 NOTE — Telephone Encounter (Signed)
 Patient states she will call and let us  know when she can stop by for a nurse visit to draw a PTH

## 2023-11-03 ENCOUNTER — Other Ambulatory Visit: Payer: Self-pay | Admitting: Radiology

## 2023-11-03 DIAGNOSIS — M810 Age-related osteoporosis without current pathological fracture: Secondary | ICD-10-CM

## 2023-11-03 MED ORDER — ROMOSOZUMAB-AQQG 105 MG/1.17ML ~~LOC~~ SOSY: 210.0000 mg | PREFILLED_SYRINGE | Freq: Once | SUBCUTANEOUS | Status: AC

## 2023-11-09 ENCOUNTER — Ambulatory Visit
Admission: RE | Admit: 2023-11-09 | Discharge: 2023-11-09 | Disposition: A | Discharge status: Home / Self Care | Attending: Family Medicine | Admitting: Family Medicine

## 2023-11-09 ENCOUNTER — Ambulatory Visit
Admission: RE | Admit: 2023-11-09 | Discharge: 2023-11-09 | Disposition: A | Discharge status: Home / Self Care | Source: Ambulatory Visit | Attending: Family Medicine | Admitting: Family Medicine

## 2023-11-09 ENCOUNTER — Ambulatory Visit: Admitting: Family Medicine

## 2023-11-09 ENCOUNTER — Encounter: Payer: Self-pay | Admitting: Family Medicine

## 2023-11-09 VITALS — BP 120/61 | HR 72 | Temp 98.4°F | Ht 61.0 in | Wt 119.4 lb

## 2023-11-09 DIAGNOSIS — F331 Major depressive disorder, recurrent, moderate: Secondary | ICD-10-CM

## 2023-11-09 DIAGNOSIS — Z131 Encounter for screening for diabetes mellitus: Secondary | ICD-10-CM

## 2023-11-09 DIAGNOSIS — M810 Age-related osteoporosis without current pathological fracture: Secondary | ICD-10-CM

## 2023-11-09 DIAGNOSIS — Z23 Encounter for immunization: Secondary | ICD-10-CM

## 2023-11-09 DIAGNOSIS — K219 Gastro-esophageal reflux disease without esophagitis: Secondary | ICD-10-CM

## 2023-11-09 DIAGNOSIS — Z77120 Contact with and (suspected) exposure to mold (toxic): Secondary | ICD-10-CM

## 2023-11-09 DIAGNOSIS — Z0000 Encounter for general adult medical examination without abnormal findings: Secondary | ICD-10-CM

## 2023-11-09 DIAGNOSIS — R058 Other specified cough: Secondary | ICD-10-CM | POA: Insufficient documentation

## 2023-11-09 DIAGNOSIS — E782 Mixed hyperlipidemia: Secondary | ICD-10-CM

## 2023-11-09 DIAGNOSIS — D508 Other iron deficiency anemias: Secondary | ICD-10-CM

## 2023-11-09 NOTE — Patient Instructions (Signed)
 To keep you healthy, please keep in mind the following health maintenance items that you are due for:   Health Maintenance Due  Topic Date Due   DTaP/Tdap/Td (1 - Tdap) Never done   Zoster Vaccines- Shingrix  (2 of 2) 09/01/2022   Influenza Vaccine  08/26/2023   Medicare Annual Wellness (AWV)  09/15/2023   COVID-19 Vaccine (6 - 2025-26 season) 09/26/2023     Best Wishes,   Dr. Lang

## 2023-11-09 NOTE — Progress Notes (Signed)
 "  Subjective:   Shari Rivera is a 68 y.o. female who presents for Medicare Annual (Subsequent) preventive examination.  Visit Complete: In person  Patient Medicare AWV questionnaire was completed by the patient on 11/09/23; I have confirmed that all information answered by patient is correct and no changes since this date.  Discussed the use of AI scribe software for clinical note transcription with the patient, who gave verbal consent to proceed.  History of Present Illness Shari Rivera is a 68 year old female who presents for an annual wellness visit.  She has a history of osteoporosis and is currently awaiting a new medication after Fosamax  was discontinued due to ineffectiveness. She is increasing her calcium  intake to 1500 mg daily through supplements and dietary sources like calcium -fortified orange juice. She experiences difficulty with nutrition due to ill-fitting dentures, impacting her ability to eat.  She has a history of recurrent major depression.  She has hyperlipidemia with reportedly improving cholesterol levels and is due for a cholesterol check during this visit.  She experiences respiratory symptoms, which she attributes to living in a house with black mold. She wakes up with a black tongue and nasal discharge, feels generally unwell, and has a sore throat and ear pain. She describes a persistent productive cough with black specks, shortness of breath, and occasional wheezing. Her voice has become hoarse and sometimes disappears.  She was previously hospitalized and diagnosed with anemia, for which she takes iron  supplements every other day due to constipation. She takes it with orange juice but is unsure if she should continue the supplementation.  She mentions a history of urinary incontinence, GERD, and carpal tunnel syndrome, but no specific details or current symptoms are discussed.  She has a history of frequent falls, but no recent incidents or  related symptoms are mentioned.  She has had a colonoscopy with findings of diverticulitis, which was described as extensive but not problematic at the time. No polyps were found, and she was told she was in good health for her age.   Cardiac Risk Factors include: advanced age (>53men, >62 women);smoking/ tobacco exposure     Objective:    Today's Vitals   11/09/23 1552  BP: 120/61  Pulse: 72  Temp: 98.4 F (36.9 C)  TempSrc: Oral  SpO2: 100%  Weight: 119 lb 6.4 oz (54.2 kg)  Height: 5' 1 (1.549 m)   Body mass index is 22.56 kg/m.  Physical Exam Vitals reviewed.  Constitutional:      General: She is not in acute distress.    Appearance: Normal appearance. She is not ill-appearing, toxic-appearing or diaphoretic.  HENT:     Head: Normocephalic and atraumatic.     Right Ear: Tympanic membrane and external ear normal. There is no impacted cerumen.     Left Ear: Tympanic membrane and external ear normal. There is no impacted cerumen.     Nose: Nose normal.     Mouth/Throat:     Pharynx: Oropharynx is clear.  Eyes:     General: No scleral icterus.    Extraocular Movements: Extraocular movements intact.     Conjunctiva/sclera: Conjunctivae normal.     Pupils: Pupils are equal, round, and reactive to light.  Cardiovascular:     Rate and Rhythm: Normal rate and regular rhythm.     Pulses: Normal pulses.     Heart sounds: Normal heart sounds. No murmur heard.    No friction rub. No gallop.  Pulmonary:  Effort: Pulmonary effort is normal. No respiratory distress.     Breath sounds: Normal breath sounds. No wheezing, rhonchi or rales.  Abdominal:     General: Bowel sounds are normal. There is no distension.     Palpations: Abdomen is soft. There is no mass.     Tenderness: There is no abdominal tenderness. There is no guarding.  Musculoskeletal:        General: No deformity.     Cervical back: Normal range of motion and neck supple. Pain with movement and muscular  tenderness present.     Right lower leg: No edema.     Left lower leg: No edema.  Lymphadenopathy:     Cervical: No cervical adenopathy.  Skin:    General: Skin is warm.     Capillary Refill: Capillary refill takes less than 2 seconds.     Findings: No erythema or rash.  Neurological:     General: No focal deficit present.     Mental Status: She is alert and oriented to person, place, and time.     Cranial Nerves: Cranial nerves 2-12 are intact. No cranial nerve deficit or facial asymmetry.     Motor: Motor function is intact. No weakness.     Gait: Gait normal.  Psychiatric:        Mood and Affect: Mood normal.        Behavior: Behavior normal.         11/09/2023    4:37 PM 08/01/2023    1:47 PM 06/28/2023    7:54 AM 11/19/2022    8:35 AM 09/20/2022   11:47 AM 09/15/2022    8:34 AM 10/05/2021    8:49 AM  Advanced Directives  Does Patient Have a Medical Advance Directive? No No No No No No No  Would patient like information on creating a medical advance directive? No - Patient declined No - Patient declined     No - Patient declined    Current Medications (verified) Outpatient Encounter Medications as of 11/09/2023  Medication Sig   acetaminophen  (TYLENOL ) 325 MG tablet Take 2 tablets (650 mg total) by mouth every 6 (six) hours as needed for mild pain (pain score 1-3), fever, headache or moderate pain (pain score 4-6) (or Fever >/= 101).   Calcium  Carbonate-Vitamin D  600-400 MG-UNIT tablet Take 1 tablet by mouth daily.   clonazePAM  (KLONOPIN ) 0.5 MG tablet TAKE 1 TABLET BY MOUTH NIGHTLY AS NEEDEDFOR ANXIETY   Coenzyme Q10 (COQ-10) 200 MG CAPS Take 200 mg by mouth at bedtime.   famotidine  (PEPCID ) 40 MG tablet TAKE ONE TABLET BY MOUTH AT BEDTIME   fluticasone  (FLONASE ) 50 MCG/ACT nasal spray Place 2 sprays into both nostrils daily.   iron  polysaccharides (NIFEREX) 150 MG capsule Take 1 capsule (150 mg total) by mouth daily.   Multiple Vitamin (MULTIVITAMIN WITH MINERALS) TABS  tablet Take 1 tablet by mouth daily.   Naproxen Sod-diphenhydrAMINE (ALEVE PM) 220-25 MG TABS Take 2 capsules by mouth at bedtime.   pantoprazole  (PROTONIX ) 40 MG tablet Take 1 tablet (40 mg total) by mouth daily for 14 days.   rosuvastatin  (CRESTOR ) 20 MG tablet Take 1 tablet (20 mg total) by mouth daily.   sertraline  (ZOLOFT ) 100 MG tablet Take 1.5 tablets (150 mg total) by mouth daily.   [DISCONTINUED] alendronate  (FOSAMAX ) 70 MG tablet TAKE 1 TABLET EVERY 7 DAYS WITH A FULL GLASS OF WATER ON AN EMPTY STOMACH DO NOT LIE DOWN FOR AT LEAST 30 MIN (Patient not taking:  Reported on 11/09/2023)   Facility-Administered Encounter Medications as of 11/09/2023  Medication   Romosozumab -aqqg (EVENITY ) 105 MG/1. injection 210 mg    Allergies (verified) Codeine   History: Past Medical History:  Diagnosis Date   Allergy    Anemia    Anxiety    Arthritis    Cancer (HCC)    Carpal tunnel syndrome, bilateral    Cataract    COVID-19    Depression    Dyspnea    former smoker   Endometriosis    Family history of adverse reaction to anesthesia    Mother difficult to wake up   GERD (gastroesophageal reflux disease)    Pneumonia    Pre-diabetes    Past Surgical History:  Procedure Laterality Date   APPENDECTOMY     CARPAL TUNNEL RELEASE Left 11/30/2022   Procedure: LEFT CARPAL TUNNEL RELEASE WITH ULTRASOUND GUIDANCE;  Surgeon: Claudene Penne ORN, MD;  Location: [facility] ORS;  Service: Neurosurgery;  Laterality: Left;  LOCAL W/ MAC   COLONOSCOPY N/A 06/28/2023   Procedure: COLONOSCOPY;  Surgeon: Jinny Carmine, MD;  Location: [facility] ENDOSCOPY;  Service: Endoscopy;  Laterality: N/A;   DIAGNOSTIC LAPAROSCOPY     endometrosis   EYE SURGERY     cataract bilateral with lens replaced   NASAL SEPTUM SURGERY     SMALL INTESTINE SURGERY     Family History  Problem Relation Age of Onset   Cancer Mother    Stroke Mother    COPD Mother    Heart failure Mother    Atrial fibrillation Mother    Heart  disease Father    Diabetes Sister    Diabetes Brother    Cancer Maternal Grandmother    Diabetes Maternal Grandmother    Kidney disease Maternal Grandfather    Heart disease Maternal Grandfather    Heart disease Paternal Grandfather    Breast cancer Other        mggm   Social History   Socioeconomic History   Marital status: Single    Spouse name: Not on file   Number of children: Not on file   Years of education: Not on file   Highest education level: Not on file  Occupational History   Not on file  Tobacco Use   Smoking status: Former   Smokeless tobacco: Never  Vaping Use   Vaping status: Former  Substance and Sexual Activity   Alcohol use: No   Drug use: Yes    Types: Marijuana    Comment: occas   Sexual activity: Not Currently  Other Topics Concern   Not on file  Social History Narrative   Lives alone , sister will be with her after surgery   Social Drivers of Health   Financial Resource Strain: Low Risk  (09/15/2022)   Overall Financial Resource Strain (CARDIA)    Difficulty of Paying Living Expenses: Not hard at all  Food Insecurity: No Food Insecurity (08/02/2023)   Hunger Vital Sign    Worried About Running Out of Food in the Last Year: Never true    Ran Out of Food in the Last Year: Never true  Transportation Needs: No Transportation Needs (08/02/2023)   PRAPARE - Administrator, Civil Service (Medical): No    Lack of Transportation (Non-Medical): No  Physical Activity: Sufficiently Active (09/15/2022)   Exercise Vital Sign    Days of Exercise per Week: 5 days    Minutes of Exercise per Session: 40 min  Stress: Stress Concern Present (  09/15/2022)   [employer] of Occupational Health - Occupational Stress Questionnaire    Feeling of Stress : To some extent  Social Connections: Socially Isolated (08/02/2023)   Social Connection and Isolation Panel    Frequency of Communication with Friends and Family: Three times a week    Frequency of  Social Gatherings with Friends and Family: Twice a week    Attends Religious Services: Patient declined    Database Administrator or Organizations: No    Attends Engineer, Structural: Never    Marital Status: Never married    Tobacco Counseling Counseling given: Not Answered   Clinical Intake:  Pre-visit preparation completed: Yes  Pain : 0-10 Pain Type: Chronic pain Pain Location: Back Pain Onset: More than a month ago Pain Frequency: Intermittent     Nutritional Status: BMI of 19-24  Normal Nutritional Risks: None Diabetes: No  How often do you need to have someone help you when you read instructions, pamphlets, or other written materials from your doctor or pharmacy?: 1 - Never  Interpreter Needed?: No      Activities of Daily Living    11/09/2023    4:35 PM 08/02/2023    4:00 PM  In your present state of health, do you have any difficulty performing the following activities:  Hearing? 1 0  Vision? 1 0  Difficulty concentrating or making decisions? 0 0  Walking or climbing stairs? 0   Dressing or bathing? 0   Doing errands, shopping? 0   Preparing Food and eating ? N   Using the Toilet? N   In the past six months, have you accidently leaked urine? Y   Do you have problems with loss of bowel control? N   Managing your Medications? N   Managing your Finances? N   Housekeeping or managing your Housekeeping? N     Patient Care Team: Sharma Coyer, MD as PCP - General (Family Medicine) Alana, Sharyle LABOR, RPH-CPP as Pharmacist Carolee Manus DASEN., MD (Ophthalmology)  Indicate any recent Medical Services you may have received from other than [facility] providers in the past year (date may be approximate).     Assessment:   This is a routine wellness examination for [facility].  Hearing/Vision screen No results found.   Goals Addressed   None    Depression Screen    11/09/2023    3:57 PM 08/09/2023    2:22 PM 05/09/2023    4:25 PM  02/07/2023    3:44 PM 10/07/2022    3:36 PM 09/15/2022    8:30 AM 07/07/2022    3:56 PM  PHQ 2/9 Scores  PHQ - 2 Score 5 2 6 4 4 4 6   PHQ- 9 Score 16 11 18 17 19 14 18     Fall Risk    11/09/2023    3:56 PM 02/07/2023    3:44 PM 09/15/2022    8:21 AM 07/07/2022    3:56 PM 06/10/2022    3:41 PM  Fall Risk   Falls in the past year? 1 1 0 1 1  Number falls in past yr: 0 1 1 1 1   Comment     5 or 6  Injury with Fall? 0 0 0 0 0  Risk for fall due to : History of fall(s)  Impaired vision;Impaired balance/gait History of fall(s) Impaired balance/gait  Follow up Falls evaluation completed  Education provided;Falls prevention discussed Falls evaluation completed     MEDICARE RISK AT HOME: Medicare Risk at  Home Any stairs in or around the home?: Yes If so, are there any without handrails?: Yes Home free of loose throw rugs in walkways, pet beds, electrical cords, etc?: Yes Adequate lighting in your home to reduce risk of falls?: Yes Life alert?: No Use of a cane, walker or w/c?: No Grab bars in the bathroom?: Yes Shower chair or bench in shower?: No Elevated toilet seat or a handicapped toilet?: Yes  TIMED UP AND GO:  Was the test performed?  No    Cognitive Function:        11/09/2023    4:40 PM 09/15/2022    8:37 AM 10/05/2021    8:54 AM  6CIT Screen  What Year? 0 points 0 points 0 points  What month? 0 points 0 points 0 points  What time? 0 points 0 points 0 points  Count back from 20 0 points 0 points 0 points  Months in reverse 0 points 0 points 0 points  Repeat phrase 0 points 0 points 0 points  Total Score 0 points 0 points 0 points    Immunizations Immunization History  Administered Date(s) Administered   Fluad Trivalent(High Dose 65+) 10/07/2022   INFLUENZA, HIGH DOSE SEASONAL PF 11/09/2023   PFIZER(Purple Top)SARS-COV-2 Vaccination 05/09/2019, 05/31/2019, 01/23/2020, 11/19/2020   PNEUMOCOCCAL CONJUGATE-20 07/07/2022   Pfizer(Comirnaty)Fall Seasonal Vaccine 12  years and older 03/10/2022   Zoster Recombinant(Shingrix ) 07/07/2022    TDAP status: Due, Education has been provided regarding the importance of this vaccine. Advised may receive this vaccine at local pharmacy or Health Dept. Aware to provide a copy of the vaccination record if obtained from local pharmacy or Health Dept. Verbalized acceptance and understanding.  Flu Vaccine status: Completed at today's visit  Pneumococcal vaccine status: Up to date  Covid-19 vaccine status: Declined, Education has been provided regarding the importance of this vaccine but patient still declined. Advised may receive this vaccine at local pharmacy or Health Dept.or vaccine clinic. Aware to provide a copy of the vaccination record if obtained from local pharmacy or Health Dept. Verbalized acceptance and understanding.  Qualifies for Shingles Vaccine? Yes   Zostavax completed No   Shingrix  Completed?: No.    Education has been provided regarding the importance of this vaccine. Patient has been advised to call insurance company to determine out of pocket expense if they have not yet received this vaccine. Advised may also receive vaccine at local pharmacy or Health Dept. Verbalized acceptance and understanding.  Screening Tests Health Maintenance  Topic Date Due   DTaP/Tdap/Td (1 - Tdap) Never done   Zoster Vaccines- Shingrix  (2 of 2) 09/01/2022   COVID-19 Vaccine (6 - 2025-26 season) 11/25/2023 (Originally 09/26/2023)   Mammogram  08/30/2024   Medicare Annual Wellness (AWV)  11/08/2024   Colonoscopy  06/27/2033   Pneumococcal Vaccine: 50+ Years  Completed   Influenza Vaccine  Completed   DEXA SCAN  Completed   Hepatitis C Screening  Completed   Meningococcal B Vaccine  Aged Out    Health Maintenance  Health Maintenance Due  Topic Date Due   DTaP/Tdap/Td (1 - Tdap) Never done   Zoster Vaccines- Shingrix  (2 of 2) 09/01/2022    Colorectal cancer screening: Type of screening: Colonoscopy. Completed  6.3.25. Repeat every 10 years  Mammogram status: Completed 8.6.25. Repeat every year  Bone Density status: Completed 1.8.24. Results reflect: Bone density results: OSTEOPOROSIS. Repeat every 2 years.  Lung Cancer Screening: (Low Dose CT Chest recommended if Age 55-80 years, 20 pack-year currently smoking  OR have quit w/in 15years.) does not qualify.   Lung Cancer Screening Referral: n/a  Additional Screening:  Hepatitis C Screening: does qualify; Completed 2022, Negative results   Vision Screening: Recommended annual ophthalmology exams for early detection of glaucoma and other disorders of the eye. Is the patient up to date with their annual eye exam?  No  Who is the provider or what is the name of the office in which the patient attends annual eye exams? Dr Carolee If pt is not established with a provider, would they like to be referred to a provider to establish care? No .   Dental Screening: Recommended annual dental exams for proper oral hygiene  Community Resource Referral / Chronic Care Management: CRR required this visit?  No   CCM required this visit?  No     Plan:     I have personally reviewed and noted the following in the patients chart:   Medical and social history Use of alcohol, tobacco or illicit drugs  Current medications and supplements including opioid prescriptions. Patient is not currently taking opioid prescriptions. Functional ability and status Nutritional status Physical activity Advanced directives List of other physicians Hospitalizations, surgeries, and ER visits in previous 12 months Vitals Screenings to include cognitive, depression, and falls Referrals and appointments  In addition, I have reviewed and discussed with patient certain preventive protocols, quality metrics, and best practice recommendations. A written personalized care plan for preventive services as well as general preventive health recommendations were provided to patient.    Assessment and Plan Assessment & Plan Respiratory symptoms due to mold exposure Subacute  Chronic respiratory symptoms likely due to mold exposure in her home, including black discoloration in nasal discharge and tongue, sore throat, ear pain, hoarseness, and productive cough with black particles. Shortness of breath and occasional wheezing reported. Mold exposure is a significant concern for respiratory health. - Order stat chest x-ray to evaluate respiratory symptoms - Prescribe albuterol inhaler for wheezing or shortness of breath, two puffs every four to six hours as needed - Refer to a value-based care institute for social work to address housing situation and mold exposure  Anemia Anemia with recent hospitalization. Currently taking iron  supplements, but experiencing constipation. Adjusted to taking iron  every other day. Advised to take iron  with orange juice to enhance absorption. - Order CBC to monitor anemia - Continue iron  supplementation every other day with orange juice  Osteoporosis Chronic condition  Ongoing management with a specialist. Fosamax  was discontinued as it was deemed ineffective. The specialist is exploring alternative treatments based on insurance coverage. Emphasis on increasing calcium  intake through diet and supplements to prepare for potential surgery. - Coordinate with osteoporosis specialist for alternative osteoporosis treatment - Encourage dietary calcium  intake and supplementation as advised by specialist  Mixed hyperlipidemia Chronic condition  Previous cholesterol levels were improving. - Order lipid panel to monitor cholesterol levels  Adult Wellness Visit Annual wellness visit conducted. Discussed the importance of regular exercise and maintaining a healthy lifestyle. Recommended 150 minutes of exercise weekly. - Administered influenza vaccine today - Recommend 150 minutes of exercise weekly  General Health Maintenance Due for Shingrix  vaccine.  Discussed importance of regular eye exams and dental care. Recent colonoscopy was normal with no polyps found. - Encourage regular eye exams and dental care  Depression Recurrent major depression. -continue Zoloft  100mg  daily ad klonopin  0.5mg  nightly PRN   Urinary incontinence Urinary incontinence.  Difficulty with nutrition due to dentures Difficulty with nutrition due to ill-fitting dentures,  impacting dietary intake. -f/u with dental specialist      Rockie Agent, MD   11/18/2023        "

## 2023-11-10 ENCOUNTER — Telehealth: Payer: Self-pay

## 2023-11-10 NOTE — Progress Notes (Signed)
 Complex Care Management Note Care Guide Note  11/10/2023 Name: Shari Rivera MRN: [id number] DOB: 08/30/1955   Complex Care Management Outreach Attempts: An unsuccessful telephone outreach was attempted today to offer the patient information about available complex care management services.  Follow Up Plan:  Additional outreach attempts will be made to offer the patient complex care management information and services.   Encounter Outcome:  No Answer-Left voicemail  Leotis Rase [facility], [facility] Guide  Direct Dial: [phone]  Fax [phone]

## 2023-11-11 LAB — CMP14+EGFR
ALT: 17 IU/L (ref 0–32)
AST: 19 IU/L (ref 0–40)
Albumin: 4.3 g/dL (ref 3.9–4.9)
Alkaline Phosphatase: 81 IU/L (ref 49–135)
BUN/Creatinine Ratio: 14 (ref 12–28)
BUN: 12 mg/dL (ref 8–27)
Bilirubin Total: 0.2 mg/dL (ref 0.0–1.2)
CO2: 23 mmol/L (ref 20–29)
Calcium: 10.1 mg/dL (ref 8.7–10.3)
Chloride: 104 mmol/L (ref 96–106)
Creatinine, Ser: 0.88 mg/dL (ref 0.57–1.00)
Globulin, Total: 2.5 g/dL (ref 1.5–4.5)
Glucose: 107 mg/dL — ABNORMAL HIGH (ref 70–99)
Potassium: 4.3 mmol/L (ref 3.5–5.2)
Sodium: 141 mmol/L (ref 134–144)
Total Protein: 6.8 g/dL (ref 6.0–8.5)
eGFR: 72 mL/min/1.73 (ref >59)

## 2023-11-11 LAB — LIPID PANEL
Chol/HDL Ratio: 3.3 ratio (ref 0.0–4.4)
Cholesterol, Total: 143 mg/dL (ref 100–199)
HDL: 43 mg/dL (ref >39)
LDL Chol Calc (NIH): 77 mg/dL (ref 0–99)
Triglycerides: 129 mg/dL (ref 0–149)
VLDL Cholesterol Cal: 23 mg/dL (ref 5–40)

## 2023-11-11 LAB — CBC
Hematocrit: 36.2 % (ref 34.0–46.6)
Hemoglobin: 11.7 g/dL (ref 11.1–15.9)
MCH: 31.1 pg (ref 26.6–33.0)
MCHC: 32.3 g/dL (ref 31.5–35.7)
MCV: 96 fL (ref 79–97)
Platelets: 258 x10E3/uL (ref 150–450)
RBC: 3.76 x10E6/uL — ABNORMAL LOW (ref 3.77–5.28)
RDW: 12.4 % (ref 11.7–15.4)
WBC: 7.5 x10E3/uL (ref 3.4–10.8)

## 2023-11-11 LAB — PARATHYROID HORMONE, INTACT (NO CA): PTH: 41 pg/mL (ref 15–65)

## 2023-11-11 LAB — HEMOGLOBIN A1C
Est. average glucose Bld gHb Est-mCnc: 111 mg/dL
Hgb A1c MFr Bld: 5.5 % (ref 4.8–5.6)

## 2023-11-11 LAB — TSH+FREE T4
Free T4: 0.76 ng/dL — ABNORMAL LOW (ref 0.82–1.77)
TSH: 1.79 u[IU]/mL (ref 0.450–4.500)

## 2023-11-11 NOTE — Progress Notes (Unsigned)
 Complex Care Management Note Care Guide Note  11/11/2023 Name: Shari Rivera MRN: [id number] DOB: 10-10-1955   Complex Care Management Outreach Attempts: A second unsuccessful outreach was attempted today to offer the patient with information about available complex care management services.  Follow Up Plan:  Additional outreach attempts will be made to offer the patient complex care management information and services.   Encounter Outcome:  No Answer-Left voicemail  Leotis Rase [facility], [facility] Guide  Direct Dial: [phone]  Fax [phone]

## 2023-11-14 NOTE — Progress Notes (Signed)
 Complex Care Management Note Care Guide Note  11/14/2023 Name: Shari Rivera MRN: [id number] DOB: February 25, 1955   Complex Care Management Outreach Attempts: A third unsuccessful outreach was attempted today to offer the patient with information about available complex care management services.  Follow Up Plan:  No further outreach attempts will be made at this time. We have been unable to contact the patient to offer or enroll patient in complex care management services.  Encounter Outcome:  No Answer-Left voicemail  Leotis Rase [facility], [facility] Guide  Direct Dial: [phone]  Fax [phone]

## 2023-11-18 ENCOUNTER — Ambulatory Visit: Payer: Self-pay | Admitting: Family Medicine

## 2023-11-18 ENCOUNTER — Encounter: Payer: Self-pay | Admitting: Family Medicine

## 2023-11-28 ENCOUNTER — Encounter: Payer: Self-pay | Admitting: Radiology

## 2023-12-09 ENCOUNTER — Telehealth: Payer: Self-pay

## 2023-12-09 NOTE — Progress Notes (Signed)
 Complex Care Management Note Care Guide Note  12/09/2023 Name: Shari Rivera MRN: [id number] DOB: 1955/09/13   Complex Care Management Outreach Attempts: An unsuccessful telephone outreach was attempted today to offer the patient information about available complex care management services.  Follow Up Plan:  No further outreach attempts will be made at this time. We have been unable to contact the patient to offer or enroll patient in complex care management services.  Encounter Outcome:  Patient Refused  Leotis Rase [facility], [facility] Guide  Direct Dial: [phone]  Fax [phone]

## 2023-12-12 ENCOUNTER — Other Ambulatory Visit: Payer: Self-pay | Admitting: Radiology

## 2023-12-12 MED ORDER — EVENITY 105 MG/1.17ML ~~LOC~~ SOSY: 210.0000 mg | PREFILLED_SYRINGE | Freq: Once | SUBCUTANEOUS | 0 refills | Status: AC

## 2023-12-12 MED ORDER — EVENITY 105 MG/1.17ML ~~LOC~~ SOSY: 210.0000 mg | PREFILLED_SYRINGE | Freq: Once | SUBCUTANEOUS | 0 refills | Status: DC

## 2023-12-12 NOTE — Progress Notes (Signed)
 Sent in error to [facility], called LM advising sent in error.   Sent in to [facility]

## 2024-01-10 ENCOUNTER — Telehealth: Admitting: *Deleted

## 2024-01-12 ENCOUNTER — Other Ambulatory Visit: Payer: Self-pay | Admitting: Family Medicine

## 2024-01-12 DIAGNOSIS — F3341 Major depressive disorder, recurrent, in partial remission: Secondary | ICD-10-CM

## 2024-01-24 ENCOUNTER — Ambulatory Visit

## 2024-01-24 VITALS — BP 94/60 | Ht 60.0 in | Wt 115.2 lb

## 2024-01-24 DIAGNOSIS — Z0000 Encounter for general adult medical examination without abnormal findings: Secondary | ICD-10-CM

## 2024-01-24 DIAGNOSIS — Z780 Asymptomatic menopausal state: Secondary | ICD-10-CM

## 2024-01-24 NOTE — Patient Instructions (Addendum)
 Shari Rivera,  Thank you for taking the time for your Medicare Wellness Visit. I appreciate your continued commitment to your health goals. Please review the care plan we discussed, and feel free to reach out if I can assist you further.  Please note that Annual Wellness Visits do not include a physical exam. Some assessments may be limited, especially if the visit was conducted virtually. If needed, we may recommend an in-person follow-up with your provider.  Ongoing Care Seeing your primary care provider every 3 to 6 months helps us  monitor your health and provide consistent, personalized care.   Referrals If a referral was made during today's visit and you haven't received any updates within two weeks, please contact the referred provider directly to check on the status.  REFERRAL FOR BONE DENSITY SCAN: You have an order for:  []   2D Mammogram  []   3D Mammogram  [x]   Bone Density     Please call for appointment:  [facility]  [address] [place] [address] [phone] [facility] [address] [place], [place] [address] [phone] [facility] [address] [place], [place] [address] [phone]   Make sure to wear two-piece clothing.  No lotions, powders, or deodorants the day of the appointment. Make sure to bring picture ID and insurance card.  Bring list of medications you are currently taking including any supplements.   Schedule your [place] screening mammogram through MyChart!   Log into your MyChart account.  Go to 'Visit' (or 'Appointments' if on mobile App) --> Schedule an Appointment  Under 'Select a Reason for Visit' choose the Mammogram Screening option.  Complete the pre-visit questions and select the time and place that best fits your schedule.   Recommended Screenings:  Health Maintenance  Topic Date Due    DTaP/Tdap/Td vaccine (1 - Tdap) Never done   Zoster (Shingles) Vaccine (2 of 2) 09/01/2022   COVID-19 Vaccine (6 - 2025-26 season) 09/26/2023   Osteoporosis screening with Bone Density Scan  02/02/2024   Breast Cancer Screening  08/30/2024   Medicare Annual Wellness Visit  01/23/2025   Colon Cancer Screening  06/27/2033   Pneumococcal Vaccine for age over 81  Completed   Flu Shot  Completed   Hepatitis C Screening  Completed   Meningitis B Vaccine  Aged Out     Vision: Annual vision screenings are recommended for early detection of glaucoma, cataracts, and diabetic retinopathy. These exams can also reveal signs of chronic conditions such as diabetes and high blood pressure.  Dental: Annual dental screenings help detect early signs of oral cancer, gum disease, and other conditions linked to overall health, including heart disease and diabetes.  Please see the attached documents for additional preventive care recommendations.   NEXT AWV  01/29/25 @ 1:50 PM IN PERSON

## 2024-01-24 NOTE — Progress Notes (Signed)
 "  Chief Complaint  Patient presents with   Medicare Wellness     Subjective:   Shari Rivera is a 68 y.o. female who presents for a Medicare Annual Wellness Visit.  Visit info / Clinical Intake: Medicare Wellness Visit Type:: Subsequent Annual Wellness Visit Persons participating in visit and providing information:: patient Medicare Wellness Visit Mode:: In-person (required for WTM) Interpreter Needed?: No Pre-visit prep was completed: yes AWV questionnaire completed by patient prior to visit?: no Living arrangements:: (!) lives alone Patient's Overall Health Status Rating: good Typical amount of pain: some Does pain affect daily life?: (!) yes Are you currently prescribed opioids?: no  Dietary Habits and Nutritional Risks How many meals a day?: (!) 1 Eats fruit and vegetables daily?: (!) no Most meals are obtained by: preparing own meals In the last 2 weeks, have you had any of the following?: none Diabetic:: no  Functional Status Activities of Daily Living (to include ambulation/medication): Independent Ambulation: Independent Medication Administration: Independent Home Management (perform basic housework or laundry): Independent Manage your own finances?: yes Primary transportation is: driving Concerns about vision?: no *vision screening is required for WTM* (READERS- HAD CATARACT SGY- DR.BELL) Concerns about hearing?: (!) yes Uses hearing aids?: no Hear whispered voice?: (!) no *in-person visit only*  Fall Screening Falls in the past year?: 0 Number of falls in past year: 0 Was there an injury with Fall?: 0 Fall Risk Category Calculator: 0 Patient Fall Risk Level: Low Fall Risk  Fall Risk Patient at Risk for Falls Due to: No Fall Risks Fall risk Follow up: Falls evaluation completed; Falls prevention discussed  Home and Transportation Safety: All rugs have non-skid backing?: yes All stairs or steps have railings?: N/A, no stairs Grab bars in the bathtub  or shower?: yes Have non-skid surface in bathtub or shower?: yes Good home lighting?: yes Regular seat belt use?: yes Hospital stays in the last year:: (!) yes How many hospital stays:: 1 Reason: E.COLI INFECTION  Cognitive Assessment Difficulty concentrating, remembering, or making decisions? : yes (CONCENTRATING, MEMORY) Will 6CIT or Mini Cog be Completed: yes What year is it?: 0 points What month is it?: 0 points Give patient an address phrase to remember (5 components): [address] [place], [place] About what time is it?: 0 points Count backwards from 20 to 1: 0 points Say the months of the year in reverse: 0 points Repeat the address phrase from earlier: 0 points 6 CIT Score: 0 points  Advance Directives (For Healthcare) Does Patient Have a Medical Advance Directive?: No Would patient like information on creating a medical advance directive?: No - Patient declined  Reviewed/Updated  Reviewed/Updated: Reviewed All (Medical, Surgical, Family, Medications, Allergies, Care Teams, Patient Goals)    Allergies (verified) Codeine   Current Medications (verified) Outpatient Encounter Medications as of 01/24/2024  Medication Sig   acetaminophen  (TYLENOL ) 325 MG tablet Take 2 tablets (650 mg total) by mouth every 6 (six) hours as needed for mild pain (pain score 1-3), fever, headache or moderate pain (pain score 4-6) (or Fever >/= 101).   Calcium  Carbonate-Vitamin D  600-400 MG-UNIT tablet Take 1 tablet by mouth daily. (Patient taking differently: Take 1 tablet by mouth daily. TAKES TWICE PER DAY)   clonazePAM  (KLONOPIN ) 0.5 MG tablet TAKE 1 TABLET BY MOUTH NIGHTLY AS NEEDEDFOR ANXIETY   Coenzyme Q10 (COQ-10) 200 MG CAPS Take 200 mg by mouth at bedtime.   famotidine  (PEPCID ) 40 MG tablet TAKE ONE TABLET BY MOUTH AT BEDTIME   fluticasone  (  FLONASE ) 50 MCG/ACT nasal spray Place 2 sprays into both nostrils daily.   Multiple Vitamin (MULTIVITAMIN WITH MINERALS) TABS tablet Take 1 tablet  by mouth daily.   Naproxen Sod-diphenhydrAMINE (ALEVE PM) 220-25 MG TABS Take 2 capsules by mouth at bedtime.   rosuvastatin  (CRESTOR ) 20 MG tablet Take 1 tablet (20 mg total) by mouth daily.   sertraline  (ZOLOFT ) 100 MG tablet TAKE ONE AND A HALF TABLETS BY MOUTH ONCE DAILY   iron  polysaccharides (NIFEREX) 150 MG capsule Take 1 capsule (150 mg total) by mouth daily. (Patient not taking: Reported on 01/24/2024)   pantoprazole  (PROTONIX ) 40 MG tablet Take 1 tablet (40 mg total) by mouth daily for 14 days. (Patient not taking: Reported on 01/24/2024)   Facility-Administered Encounter Medications as of 01/24/2024  Medication   Romosozumab -aqqg (EVENITY ) 105 MG/1. injection 210 mg    History: Past Medical History:  Diagnosis Date   Allergy    Anemia    Anxiety    Arthritis    Cancer (HCC)    Carpal tunnel syndrome, bilateral    Cataract    COVID-19    Depression    Dyspnea    former smoker   Endometriosis    Family history of adverse reaction to anesthesia    Mother difficult to wake up   GERD (gastroesophageal reflux disease)    Pneumonia    Pre-diabetes    Past Surgical History:  Procedure Laterality Date   APPENDECTOMY     CARPAL TUNNEL RELEASE Left 11/30/2022   Procedure: LEFT CARPAL TUNNEL RELEASE WITH ULTRASOUND GUIDANCE;  Surgeon: Claudene Penne ORN, MD;  Location: [facility] ORS;  Service: Neurosurgery;  Laterality: Left;  LOCAL W/ MAC   COLONOSCOPY N/A 06/28/2023   Procedure: COLONOSCOPY;  Surgeon: Jinny Carmine, MD;  Location: [facility] ENDOSCOPY;  Service: Endoscopy;  Laterality: N/A;   DIAGNOSTIC LAPAROSCOPY     endometrosis   EYE SURGERY     cataract bilateral with lens replaced   NASAL SEPTUM SURGERY     SMALL INTESTINE SURGERY     Family History  Problem Relation Age of Onset   Cancer Mother    Stroke Mother    COPD Mother    Heart failure Mother    Atrial fibrillation Mother    Heart disease Father    Diabetes Sister    Diabetes Brother    Cancer Maternal  Grandmother    Diabetes Maternal Grandmother    Kidney disease Maternal Grandfather    Heart disease Maternal Grandfather    Heart disease Paternal Grandfather    Breast cancer Other        mggm   Social History   Occupational History   Not on file  Tobacco Use   Smoking status: Former   Smokeless tobacco: Never  Vaping Use   Vaping status: Former  Substance and Sexual Activity   Alcohol use: No   Drug use: Yes    Types: Marijuana    Comment: occas   Sexual activity: Not Currently   Tobacco Counseling Counseling given: Not Answered  SDOH Screenings   Food Insecurity: No Food Insecurity (08/02/2023)  Housing: Low Risk (08/02/2023)  Transportation Needs: No Transportation Needs (08/02/2023)  Utilities: Not At Risk (08/02/2023)  Alcohol Screen: Low Risk (09/15/2022)  Depression (PHQ2-9): High Risk (11/09/2023)  Financial Resource Strain: Low Risk (09/15/2022)  Physical Activity: Sufficiently Active (09/15/2022)  Social Connections: Socially Isolated (08/02/2023)  Stress: Stress Concern Present (09/15/2022)  Tobacco Use: Medium Risk (01/24/2024)  Health Literacy: Adequate Health Literacy (  09/15/2022)   See flowsheets for full screening details  Depression Screen PHQ 2 & 9 Depression Scale- Over the past 2 weeks, how often have you been bothered by any of the following problems? Little interest or pleasure in doing things: 3 Feeling down, depressed, or hopeless (PHQ Adolescent also includes...irritable): 2 PHQ-2 Total Score: 5 Trouble falling or staying asleep, or sleeping too much: 3 Feeling tired or having little energy: 2 Poor appetite or overeating (PHQ Adolescent also includes...weight loss): 3 Feeling bad about yourself - or that you are a failure or have let yourself or your family down: 1 Trouble concentrating on things, such as reading the newspaper or watching television (PHQ Adolescent also includes...like school work): 1 Moving or speaking so slowly that other people  could have noticed. Or the opposite - being so fidgety or restless that you have been moving around a lot more than usual: 1 Thoughts that you would be better off dead, or of hurting yourself in some way: 0 PHQ-9 Total Score: 16 If you checked off any problems, how difficult have these problems made it for you to do your work, take care of things at home, or get along with other people?: Somewhat difficult  Depression Treatment Depression Interventions/Treatment : Medication     Goals Addressed             This Visit's Progress    DIET - INCREASE WATER INTAKE               Objective:    Today's Vitals   01/24/24 1403  BP: 94/60  Weight: 115 lb 3.2 oz (52.3 kg)  Height: 5' (1.524 m)   Body mass index is 22.5 kg/m.  Hearing/Vision screen Hearing Screening - Comments:: NEEDS AIDS, BUT CAN'T AFFORD THEM  Immunizations and Health Maintenance Health Maintenance  Topic Date Due   DTaP/Tdap/Td (1 - Tdap) Never done   Zoster Vaccines- Shingrix  (2 of 2) 09/01/2022   COVID-19 Vaccine (6 - 2025-26 season) 09/26/2023   Mammogram  08/30/2024   Medicare Annual Wellness (AWV)  01/23/2025   Colonoscopy  06/27/2033   Pneumococcal Vaccine: 50+ Years  Completed   Influenza Vaccine  Completed   Bone Density Scan  Completed   Hepatitis C Screening  Completed   Meningococcal B Vaccine  Aged Out        Assessment/Plan:  This is a routine wellness examination for [facility].  Patient Care Team: Sharma Coyer, MD as PCP - General (Family Medicine) Alana, Sharyle LABOR, RPH-CPP as Pharmacist Carolee Manus DASEN., MD (Ophthalmology)  I have personally reviewed and noted the following in the patients chart:   Medical and social history Use of alcohol, tobacco or illicit drugs  Current medications and supplements including opioid prescriptions. Functional ability and status Nutritional status Physical activity Advanced directives List of other  physicians Hospitalizations, surgeries, and ER visits in previous 12 months Vitals Screenings to include cognitive, depression, and falls Referrals and appointments  No orders of the defined types were placed in this encounter.  In addition, I have reviewed and discussed with patient certain preventive protocols, quality metrics, and best practice recommendations. A written personalized care plan for preventive services as well as general preventive health recommendations were provided to patient.   Jhonnie GORMAN Das, LPN   [id number]   Return in 1 year (on 01/23/2025).  After Visit Summary: (In Person-Printed) AVS printed and given to the patient  Nurse Notes: UTD ON SHOTS EXCEPT TDAP; UTD ON MAMMOGRAM, COLONOSCOPY; ORDERED  BDS  "

## 2024-02-09 ENCOUNTER — Ambulatory Visit: Admitting: Family Medicine

## 2024-02-17 ENCOUNTER — Other Ambulatory Visit: Payer: Self-pay | Admitting: Family Medicine

## 2024-02-17 DIAGNOSIS — F3341 Major depressive disorder, recurrent, in partial remission: Secondary | ICD-10-CM

## 2024-02-17 NOTE — Telephone Encounter (Signed)
 Medication refill declined. Patient no-showed recent appointment with Dr. Lang. Needs appointment for refills.

## 2024-02-24 ENCOUNTER — Other Ambulatory Visit

## 2024-03-27 ENCOUNTER — Other Ambulatory Visit

## 2024-05-01 ENCOUNTER — Ambulatory Visit: Admitting: Family Medicine

## 2024-11-23 ENCOUNTER — Encounter

## 2025-01-29 ENCOUNTER — Ambulatory Visit
